# Patient Record
Sex: Female | Born: 1940 | Race: White | Hispanic: No | Smoking: Former smoker
Health system: Southern US, Community
[De-identification: ages and names within clinical notes are randomized; demographics above are authoritative.]

## PROBLEM LIST (undated history)

## (undated) DIAGNOSIS — I2510 Atherosclerotic heart disease of native coronary artery without angina pectoris: Secondary | ICD-10-CM

## (undated) DIAGNOSIS — I10 Essential (primary) hypertension: Secondary | ICD-10-CM

## (undated) DIAGNOSIS — I252 Old myocardial infarction: Secondary | ICD-10-CM

## (undated) DIAGNOSIS — E785 Hyperlipidemia, unspecified: Secondary | ICD-10-CM

## (undated) HISTORY — DX: Hyperlipidemia, unspecified: E78.5

## (undated) HISTORY — PX: NECK SURGERY: SHX720

## (undated) HISTORY — PX: BACK SURGERY: SHX140

## (undated) HISTORY — DX: Old myocardial infarction: I25.2

## (undated) HISTORY — PX: APPENDECTOMY: SHX54

## (undated) HISTORY — PX: TONSILLECTOMY: SUR1361

## (undated) HISTORY — DX: Essential (primary) hypertension: I10

## (undated) HISTORY — PX: OTHER SURGICAL HISTORY: SHX169

## (undated) HISTORY — DX: Atherosclerotic heart disease of native coronary artery without angina pectoris: I25.10

---

## 1996-03-22 DIAGNOSIS — I252 Old myocardial infarction: Secondary | ICD-10-CM

## 1996-03-22 HISTORY — DX: Old myocardial infarction: I25.2

## 2010-03-13 ENCOUNTER — Ambulatory Visit: Payer: Self-pay | Admitting: Cardiology

## 2010-03-13 ENCOUNTER — Encounter: Payer: Self-pay | Admitting: Cardiology

## 2010-03-13 DIAGNOSIS — E1151 Type 2 diabetes mellitus with diabetic peripheral angiopathy without gangrene: Secondary | ICD-10-CM

## 2010-03-13 DIAGNOSIS — I2510 Atherosclerotic heart disease of native coronary artery without angina pectoris: Secondary | ICD-10-CM

## 2010-03-13 DIAGNOSIS — E1165 Type 2 diabetes mellitus with hyperglycemia: Secondary | ICD-10-CM

## 2010-03-13 DIAGNOSIS — I10 Essential (primary) hypertension: Secondary | ICD-10-CM

## 2010-03-13 DIAGNOSIS — E785 Hyperlipidemia, unspecified: Secondary | ICD-10-CM | POA: Insufficient documentation

## 2010-03-25 ENCOUNTER — Telehealth (INDEPENDENT_AMBULATORY_CARE_PROVIDER_SITE_OTHER): Payer: Self-pay | Admitting: *Deleted

## 2010-03-26 ENCOUNTER — Encounter (HOSPITAL_COMMUNITY): Admission: RE | Admit: 2010-03-26 | Payer: Self-pay | Source: Home / Self Care | Admitting: Cardiology

## 2010-04-01 ENCOUNTER — Telehealth (INDEPENDENT_AMBULATORY_CARE_PROVIDER_SITE_OTHER): Payer: Self-pay | Admitting: *Deleted

## 2010-04-02 ENCOUNTER — Ambulatory Visit: Admission: RE | Admit: 2010-04-02 | Discharge: 2010-04-02 | Payer: Self-pay | Source: Home / Self Care

## 2010-04-02 ENCOUNTER — Encounter (HOSPITAL_COMMUNITY)
Admission: RE | Admit: 2010-04-02 | Discharge: 2010-04-21 | Payer: Self-pay | Source: Home / Self Care | Attending: Cardiology | Admitting: Cardiology

## 2010-04-02 ENCOUNTER — Encounter: Payer: Self-pay | Admitting: Cardiovascular Disease

## 2010-04-10 ENCOUNTER — Ambulatory Visit
Admission: RE | Admit: 2010-04-10 | Discharge: 2010-04-10 | Payer: Self-pay | Source: Home / Self Care | Attending: Cardiology | Admitting: Cardiology

## 2010-04-23 NOTE — Progress Notes (Signed)
Summary: Nuclear Pre-Procedure  Phone Note Outgoing Call   Call placed by: Milana Na, EMT-P,  April 01, 2010 3:43 PM Summary of Call: Reviewed information on Myoview Information Sheet (see scanned document for further details).  Spoke with patient.     Nuclear Med Background Indications for Stress Test: Evaluation for Ischemia, Surgical Clearance, Stent Patency  Indications Comments: Pending back surgery  History: Heart Catheterization, Myocardial Infarction, Stents  History Comments: '98 Heart Cath--MI--Stents [facility]     Nuclear Pre-Procedure Cardiac Risk Factors: CVA, History of Smoking, Hypertension, NIDDM Height (in): 63  Nuclear Med Study Referring MD:  T.Stuckey

## 2010-04-23 NOTE — Assessment & Plan Note (Addendum)
Summary: np6/surgical clearence for back surgery   Visit Type:  Initial Consult Primary Rosalina Dingwall:  Earlene Plater  CC:  Surgical clearance- back surgery.  History of Present Illness: Patient has had some severe pain in R leg, has had four separate injections, and has not had much relief.  Despite that, she has had virtually no improvement.  Patient has had prior stroke in 1996.  Her primary MD is Dr. Lois Huxley at [facility].  She is able to drive and work in her garden.  She gets no tightness in her chest.  She does not get shortness of breath.  Overall does pretty well.  She lives with her boyfriend.  SHe has no problems at present.  She stopped working after her stroke in 1996.  She was a smoker at that time.  Her smoking stopped after her heart attack.    Problems Prior to Update: 1)  Coronary Atherosclerosis Native Coronary Artery  (ICD-414.01)  Current Medications (verified): 1)  Simvastatin 40 Mg Tabs (Simvastatin) .... Take One Tablet By Mouth Daily At Bedtime 2)  Amlodipine Besylate 10 Mg Tabs (Amlodipine Besylate) .... Take One Tablet By Mouth Daily 3)  Metoprolol Succinate 100 Mg Xr24h-Tab (Metoprolol Succinate) .... Take 1 1/2 Tablets Daily 4)  Enalapril Maleate 10 Mg Tabs (Enalapril Maleate) .... Take One Tablet By Mouth Twice A Day 5)  Glipizide 5 Mg Xr24h-Tab (Glipizide) .... Take 2 Tablets Am and 1 Tablet Pm 6)  Metformin Hcl 1000 Mg Tabs (Metformin Hcl) .... Take 1 Tablet By Mouth Two Times A Day 7)  Amitriptyline Hcl 50 Mg Tabs (Amitriptyline Hcl) .... Take 2 Tablets At Bedtime 8)  Hydrocodone-Acetaminophen 2.5-500 Mg Tabs (Hydrocodone-Acetaminophen) .... As Needed 9)  Aspirin 81 Mg Tbec (Aspirin) .... Take One Tablet By Mouth Daily 10)  Fish Oil 1000 Mg Caps (Omega-3 Fatty Acids) .... Take 2 Capsules Two Times A Day 11)  Vitamin D 400 Unit Caps (Cholecalciferol) .... Take 2 A Day  Allergies (verified): 1)  ! Morphine 2)  ! Codeine 3)  ! * Ivp Dye  Past  History:  Past Medical History: CVA 1996  --- right side was weak.  Patient recovered from that and could not walk.  Went through therapy Patient had heart stent for a blockage--done in [place].  She had trouble during heart catheterization.  This was 1998.   hypertension diabetes  Past Surgical History: Stent placed.   DIsk in neck Back Surgery  --[facility] hospitals Skin cancer---treated in [facility] by Engineer, petroleum.  Family History: F  77  heart failure, emphysema M 85  cancer 1 sister  back problems  Social History: Former smoker Worked in Soil [employer] 3 children  --daughter in [place],   [place] Signs:  Patient profile:   70 year old female Height:      63 inches Weight:      164 pounds BMI:     29.16 Pulse rate:   101 / minute Pulse rhythm:   regular Resp:     18 per minute BP sitting:   154 / 70  (left arm) Cuff size:   large  Vitals Entered By: Vikki Ports (March 13, 2010 2:56 PM)  Physical Exam  General:  Well developed, well nourished, in no acute distress. Head:  normocephalic and atraumatic Eyes:  PERRLA/EOM intact; conjunctiva and lids normal. Neck:  Palpable carotids bilaterally.  No definite bruit.   Lungs:  Clear bilaterally to auscultation and percussion. Heart:  PMI non displaced. Normal  S1 and S2.  No murmur or rub, or gallop at present.   Abdomen:  Bowel sounds positive; abdomen soft and non-tender without masses, organomegaly, or hernias noted. No hepatosplenomegaly. Pulses:  pulses normal in all 4 extremities Extremities:  No clubbing or cyanosis. Neurologic:  Alert and oriented x 3.  Some r hand and leg weakness   Impression & Recommendations:  Problem # 1:  CORONARY ATHEROSCLEROSIS NATIVE CORONARY ARTERY (ICD-414.01) Has history of prior stent.  We will need to get records from [facility].  She has a stent, but is unknown about extent of her disease.  She cannot do 4 mets of activity, and therefore, will require functional  assessment by pharmacologic stress prior to surgery.  The procedure would be a moderate risk undertaking.   Her updated medication list for this problem includes:    Amlodipine Besylate 10 Mg Tabs (Amlodipine besylate) .Marland Kitchen... Take one tablet by mouth daily    Metoprolol Succinate 100 Mg Xr24h-tab (Metoprolol succinate) .Marland Kitchen... Take 1 1/2 tablets daily    Enalapril Maleate 10 Mg Tabs (Enalapril maleate) .Marland Kitchen... Take one tablet by mouth twice a day    Aspirin 81 Mg Tbec (Aspirin) .Marland Kitchen... Take one tablet by mouth daily  Orders: EKG w/ Interpretation (93000) Nuclear Stress Test (Nuc Stress Test)  Problem # 2:  HYPERTENSION, BENIGN (ICD-401.1) moderate control. Her updated medication list for this problem includes:    Amlodipine Besylate 10 Mg Tabs (Amlodipine besylate) .Marland Kitchen... Take one tablet by mouth daily    Metoprolol Succinate 100 Mg Xr24h-tab (Metoprolol succinate) .Marland Kitchen... Take 1 1/2 tablets daily    Enalapril Maleate 10 Mg Tabs (Enalapril maleate) .Marland Kitchen... Take one tablet by mouth twice a day    Aspirin 81 Mg Tbec (Aspirin) .Marland Kitchen... Take one tablet by mouth daily  Problem # 3:  DM (ICD-250.00) stable.  Hgb A1c under 7 per patient .  Her updated medication list for this problem includes:    Enalapril Maleate 10 Mg Tabs (Enalapril maleate) .Marland Kitchen... Take one tablet by mouth twice a day    Glipizide 5 Mg Xr24h-tab (Glipizide) .Marland Kitchen... Take 2 tablets am and 1 tablet pm    Metformin Hcl 1000 Mg Tabs (Metformin hcl) .Marland Kitchen... Take 1 tablet by mouth two times a day    Aspirin 81 Mg Tbec (Aspirin) .Marland Kitchen... Take one tablet by mouth daily  Problem # 4:  HYPERLIPIDEMIA-MIXED (ICD-272.4) has a good relationship with her primary MD.  Will defer management to him.  Her updated medication list for this problem includes:    Simvastatin 40 Mg Tabs (Simvastatin) .Marland Kitchen... Take one tablet by mouth daily at bedtime  Patient Instructions: 1)  Your physician has requested that you have a Lexiscan myoview.  For further information please  visit [email or web address]  Please follow instruction sheet, as given. 2)  Your physician recommends that you continue on your current medications as directed. Please refer to the Current Medication list given to you today. 3)  Your physician recommends that you schedule a follow-up appointment in: 1 MONTH

## 2010-04-23 NOTE — Assessment & Plan Note (Addendum)
Summary: Cardiology Nuclear Testing  Nuclear Med Background Indications for Stress Test: Evaluation for Ischemia, Surgical Clearance, Stent Patency  Indications Comments: Pending back surgery  History: Heart Catheterization, Myocardial Infarction, Stents  History Comments: '98 Heart Cath--MI--Stents [facility]     Nuclear Pre-Procedure Cardiac Risk Factors: CVA, History of Smoking, Hypertension, NIDDM Caffeine/Decaff Intake: None NPO After: 8:00 PM Lungs: clear IV 0.9% NS with Angio Cath: 20g     IV Site: R Forearm IV Started by: Stanton Kidney, EMT-P Chest Size (in) 44     Cup Size C     Height (in): 63 Weight (lb): 163 BMI: 28.98 Tech Comments: Metoprolol held > 24 hours, per Patient.   Nuclear Med Study 1 or 2 day study:  1 day     Stress Test Type:  Eugenie Birks Reading MD:  Charlton Haws, MD     Referring MD:  T.Stuckey Resting Radionuclide:  Technetium 64m Tetrofosmin     Resting Radionuclide Dose:  11.0 mCi  Stress Radionuclide:  Technetium 42m Tetrofosmin     Stress Radionuclide Dose:  33.0 mCi   Stress Protocol  Max Systolic BP: 158 mm Hg Lexiscan: 0.4 mg   Stress Test Technologist:  Milana Na, EMT-P     Nuclear Technologist:  Domenic Polite, CNMT  Rest Procedure  Myocardial perfusion imaging was performed at rest 45 minutes following the intravenous administration of Technetium 22m Tetrofosmin.  Stress Procedure  The patient received IV Lexiscan 0.4 mg over 15-seconds.  Technetium 6m Tetrofosmin injected at 30-seconds.  There were no significant changes with infusion.  Quantitative spect images were obtained after a 45 minute delay.  QPS Raw Data Images:  Normal; no motion artifact; normal heart/lung ratio. Stress Images:  Normal homogeneous uptake in all areas of the myocardium. Rest Images:  Normal homogeneous uptake in all areas of the myocardium. Subtraction (SDS):  Normal Transient Ischemic Dilatation:  1.07  (Normal <1.22)  Lung/Heart Ratio:  0.38  (Normal  <0.45)  Quantitative Gated Spect Images QGS EDV:  57 ml QGS ESV:  17 ml QGS EF:  70 % QGS cine images:  normal  Findings Normal nuclear study      Overall Impression  Exercise Capacity: Lexiscan with no exercise. BP Response: Normal blood pressure response. Clinical Symptoms: Cough ECG Impression: No significant ST segment change suggestive of ischemia. Overall Impression: Normal stress nuclear study.  Appended Document: Cardiology Nuclear Testing Reviewed with patient at time of office visit tS

## 2010-04-23 NOTE — Assessment & Plan Note (Signed)
Summary: APPT. AT 2:00P/OK PER LAUREN/D.MILLER   Visit Type:  Follow-up Primary Provider:  Earlene Plater   History of Present Illness: In today for followup.  Overall is stable.  Needs to have surgery. Underwent nuclear testing, and findings are as noted in note below.  No change since the last office visit.   Problems Prior to Update: 1)  Hyperlipidemia-mixed  (ICD-272.4) 2)  Dm  (ICD-250.00) 3)  Hypertension, Benign  (ICD-401.1) 4)  Coronary Atherosclerosis Native Coronary Artery  (ICD-414.01)  Current Medications (verified): 1)  Simvastatin 40 Mg Tabs (Simvastatin) .... Take One Tablet By Mouth Daily At Bedtime 2)  Amlodipine Besylate 10 Mg Tabs (Amlodipine Besylate) .... Take One Tablet By Mouth Daily 3)  Metoprolol Succinate 100 Mg Xr24h-Tab (Metoprolol Succinate) .... Take 1 1/2 Tablets Daily 4)  Enalapril Maleate 10 Mg Tabs (Enalapril Maleate) .... Take One Tablet By Mouth Twice A Day 5)  Glipizide 5 Mg Xr24h-Tab (Glipizide) .... Take 2 Tablets Am and 1 Tablet Pm 6)  Metformin Hcl 1000 Mg Tabs (Metformin Hcl) .... Take 1 Tablet By Mouth Two Times A Day 7)  Amitriptyline Hcl 50 Mg Tabs (Amitriptyline Hcl) .... Take 2 Tablets At Bedtime 8)  Hydrocodone-Acetaminophen 2.5-500 Mg Tabs (Hydrocodone-Acetaminophen) .... As Needed 9)  Aspirin 81 Mg Tbec (Aspirin) .... Take One Tablet By Mouth Daily 10)  Fish Oil 1000 Mg Caps (Omega-3 Fatty Acids) .... Take 2 Capsules Two Times A Day 11)  Vitamin D 400 Unit Caps (Cholecalciferol) .... Take 2 A Day  Allergies: 1)  ! Morphine 2)  ! Codeine 3)  ! * Ivp Dye  Past History:  Past Medical History: Last updated: 04/08/10 Heart attack 1998 Hypertension DM type 2 Back problems  Past Surgical History: Last updated: 08-Apr-2010 Back surgery  Family History: Last updated: 04/08/2010 Mother deceased 31 bone cancer father deceased 53 - heart failure sisters- 1 alive 31- back- hip problems  Social History: Last updated:  08-Apr-2010 widowed- 3 children Quit smoking 1998 Alcohol use no Drug use- no Retired desability  Vital Signs:  Patient profile:   70 year old female Height:      63 inches Weight:      161.50 pounds BMI:     28.71 Pulse rate:   97 / minute Pulse rhythm:   regular Resp:     18 per minute BP sitting:   148 / 75  (left arm) Cuff size:   large  Vitals Entered By: Vikki Ports (April 10, 2010 2:04 PM)  Physical Exam  General:  Well developed, well nourished, in no acute distress. Lungs:  Clear bilaterally to auscultation and percussion. Heart:  Non displaced.  Normal S1 and S2.   Extremities:  No clubbing or cyanosis. Neurologic:  Alert and oriented x 3.   Nuclear Study  Procedure date:  04/02/2010  Findings:      QPS  Raw Data Images:  Normal; no motion artifact; normal heart/lung ratio. Stress Images:  Normal homogeneous uptake in all areas of the myocardium. Rest Images:  Normal homogeneous uptake in all areas of the myocardium. Subtraction (SDS):  Normal Transient Ischemic Dilatation:  1.07  (Normal <1.22)  Lung/Heart Ratio:  0.38  (Normal <0.45)  Quantitative Gated Spect Images  QGS EDV:  57 ml QGS ESV:  17 ml QGS EF:  70 % QGS cine images:  normal  Findings  Normal nuclear study      Overall Impression   Exercise Capacity: Lexiscan with no exercise. BP Response: Normal blood pressure response.  Clinical Symptoms: Cough ECG Impression: No significant ST segment change suggestive of ischemia. Overall Impression: Normal stress nuclear study.    Signed by Colon Branch, MD, [facility] on 04/02/2010 at 4:51 PM   Impression & Recommendations:  Problem # 1:  CORONARY ATHEROSCLEROSIS NATIVE CORONARY ARTERY (ICD-414.01) See results of nuclear study.  No ischemia.  Value of study, and lack of value at times, discussed with patient and her family.  She should be stable to proceed on with surgery in absence of significant ischemia.   Her updated  medication list for this problem includes:    Amlodipine Besylate 10 Mg Tabs (Amlodipine besylate) .Marland Kitchen... Take one tablet by mouth daily    Metoprolol Succinate 100 Mg Xr24h-tab (Metoprolol succinate) .Marland Kitchen... Take 1 1/2 tablets daily    Enalapril Maleate 10 Mg Tabs (Enalapril maleate) .Marland Kitchen... Take one tablet by mouth twice a day    Aspirin 81 Mg Tbec (Aspirin) .Marland Kitchen... Take one tablet by mouth daily  Problem # 2:  HYPERTENSION, BENIGN (ICD-401.1) currently stable. Her updated medication list for this problem includes:    Amlodipine Besylate 10 Mg Tabs (Amlodipine besylate) .Marland Kitchen... Take one tablet by mouth daily    Metoprolol Succinate 100 Mg Xr24h-tab (Metoprolol succinate) .Marland Kitchen... Take 1 1/2 tablets daily    Enalapril Maleate 10 Mg Tabs (Enalapril maleate) .Marland Kitchen... Take one tablet by mouth twice a day    Aspirin 81 Mg Tbec (Aspirin) .Marland Kitchen... Take one tablet by mouth daily  Problem # 3:  HYPERLIPIDEMIA-MIXED (ICD-272.4) Not likely to get same control. but with amlodipine should switch medications.  Discussed in detail.  Will suggest switch of meds with uptitration over time.  FDA warning reviewed with patient.   The following medications were removed from the medication list:    Simvastatin 40 Mg Tabs (Simvastatin) .Marland Kitchen... Take one tablet by mouth daily at bedtime Her updated medication list for this problem includes:    Pravastatin Sodium 40 Mg Tabs (Pravastatin sodium) ..... One daily  Patient Instructions: 1)  Your physician recommends that you schedule a follow-up appointment in 3 months with Dr Riley Kill 2)  Your physician has recommended you make the following change in your medication: Stop simvastatin and start pravastatin 40 mg once daily Prescriptions: PRAVASTATIN SODIUM 40 MG TABS (PRAVASTATIN SODIUM) one daily  #30 x 11   Entered by:   Charolotte Capuchin, RN   Authorized by:   Ronaldo Miyamoto, MD, [facility]   Signed by:   Charolotte Capuchin, RN on 04/10/2010   Method used:   Electronically to         [facility] [phone]* (retail)       [address]       [place], [place]  [address]       Ph: [phone]       Fax: [phone]   RxID:   [id number]

## 2010-04-23 NOTE — Progress Notes (Signed)
Summary: Patient sick/ Lexiscan Myoview test  Phone Note Call from Patient   Caller: Patient Reason for Call: Acute Illness Action Taken: Phone Call Completed Summary of Call: Catherine Greene called in this am, she woke up sick. The patient states that her family has been sick. She has a Building surveyor tomorrow 03/26/10. She was advised to call back in the am on 03/26/10 so we can make the decision if she is able to do the test or reschedule. S.Williams EMTP Call placed by: Milana Na, EMT-P,  March 25, 2010 8:34 AM

## 2010-05-11 ENCOUNTER — Encounter (HOSPITAL_COMMUNITY)
Admission: RE | Admit: 2010-05-11 | Discharge: 2010-05-11 | Disposition: A | Discharge status: Home / Self Care | Payer: MEDICARE | Source: Ambulatory Visit | Attending: Neurosurgery | Admitting: Neurosurgery

## 2010-05-11 ENCOUNTER — Telehealth (INDEPENDENT_AMBULATORY_CARE_PROVIDER_SITE_OTHER): Payer: Self-pay | Admitting: *Deleted

## 2010-05-11 ENCOUNTER — Ambulatory Visit (HOSPITAL_COMMUNITY)
Admission: RE | Admit: 2010-05-11 | Discharge: 2010-05-11 | Disposition: A | Discharge status: Home / Self Care | Payer: MEDICARE | Source: Ambulatory Visit | Attending: Neurosurgery | Admitting: Neurosurgery

## 2010-05-11 ENCOUNTER — Other Ambulatory Visit (HOSPITAL_COMMUNITY): Payer: Self-pay | Admitting: Neurosurgery

## 2010-05-11 DIAGNOSIS — Z01818 Encounter for other preprocedural examination: Secondary | ICD-10-CM | POA: Insufficient documentation

## 2010-05-11 DIAGNOSIS — E119 Type 2 diabetes mellitus without complications: Secondary | ICD-10-CM | POA: Insufficient documentation

## 2010-05-11 DIAGNOSIS — I10 Essential (primary) hypertension: Secondary | ICD-10-CM | POA: Insufficient documentation

## 2010-05-11 DIAGNOSIS — M549 Dorsalgia, unspecified: Secondary | ICD-10-CM

## 2010-05-11 LAB — CBC
Hemoglobin: 14.3 g/dL (ref 12.0–15.0)
MCH: 29.4 pg (ref 26.0–34.0)
MCHC: 33.1 g/dL (ref 30.0–36.0)
MCV: 88.9 fL (ref 78.0–100.0)

## 2010-05-11 LAB — BASIC METABOLIC PANEL
BUN: 10 mg/dL (ref 6–23)
CO2: 27 mEq/L (ref 19–32)
Calcium: 9.7 mg/dL (ref 8.4–10.5)
Creatinine, Ser: 0.68 mg/dL (ref 0.4–1.2)
GFR calc Af Amer: >60 mL/min (ref >60)

## 2010-05-11 LAB — SURGICAL PCR SCREEN
MRSA, PCR: POSITIVE — AB
Staphylococcus aureus: POSITIVE — AB

## 2010-05-11 LAB — ABO/RH: ABO/RH(D): AB POS

## 2010-05-12 LAB — TYPE AND SCREEN: Antibody Screen: NEGATIVE

## 2010-05-15 ENCOUNTER — Inpatient Hospital Stay (HOSPITAL_COMMUNITY): Payer: MEDICARE

## 2010-05-15 ENCOUNTER — Encounter (HOSPITAL_COMMUNITY)
Admission: RE | Admit: 2010-05-15 | Discharge: 2010-05-15 | Disposition: A | Discharge status: Home / Self Care | Payer: MEDICARE | Source: Ambulatory Visit | Attending: Neurosurgery | Admitting: Neurosurgery

## 2010-05-15 ENCOUNTER — Other Ambulatory Visit (HOSPITAL_COMMUNITY): Payer: Self-pay | Admitting: Neurosurgery

## 2010-05-15 ENCOUNTER — Inpatient Hospital Stay (HOSPITAL_COMMUNITY)
Admission: RE | Admit: 2010-05-15 | Discharge: 2010-05-18 | DRG: 460 | Disposition: A | Discharge status: Home / Self Care | Payer: MEDICARE | Source: Ambulatory Visit | Attending: Neurosurgery | Admitting: Neurosurgery

## 2010-05-15 DIAGNOSIS — Q762 Congenital spondylolisthesis: Secondary | ICD-10-CM

## 2010-05-15 DIAGNOSIS — E119 Type 2 diabetes mellitus without complications: Secondary | ICD-10-CM | POA: Diagnosis present

## 2010-05-15 DIAGNOSIS — M4120 Other idiopathic scoliosis, site unspecified: Secondary | ICD-10-CM | POA: Diagnosis present

## 2010-05-15 DIAGNOSIS — Z01811 Encounter for preprocedural respiratory examination: Secondary | ICD-10-CM

## 2010-05-15 DIAGNOSIS — Z9861 Coronary angioplasty status: Secondary | ICD-10-CM

## 2010-05-15 DIAGNOSIS — I10 Essential (primary) hypertension: Secondary | ICD-10-CM | POA: Diagnosis present

## 2010-05-15 DIAGNOSIS — I2510 Atherosclerotic heart disease of native coronary artery without angina pectoris: Secondary | ICD-10-CM | POA: Diagnosis present

## 2010-05-15 DIAGNOSIS — M5126 Other intervertebral disc displacement, lumbar region: Principal | ICD-10-CM | POA: Diagnosis present

## 2010-05-15 LAB — GLUCOSE, CAPILLARY
Glucose-Capillary: 174 mg/dL — ABNORMAL HIGH (ref 70–99)
Glucose-Capillary: 208 mg/dL — ABNORMAL HIGH (ref 70–99)

## 2010-05-16 LAB — GLUCOSE, CAPILLARY: Glucose-Capillary: 95 mg/dL (ref 70–99)

## 2010-05-17 ENCOUNTER — Inpatient Hospital Stay (HOSPITAL_COMMUNITY): Payer: MEDICARE

## 2010-05-17 LAB — GLUCOSE, CAPILLARY
Glucose-Capillary: 137 mg/dL — ABNORMAL HIGH (ref 70–99)
Glucose-Capillary: 192 mg/dL — ABNORMAL HIGH (ref 70–99)

## 2010-05-18 LAB — GLUCOSE, CAPILLARY: Glucose-Capillary: 191 mg/dL — ABNORMAL HIGH (ref 70–99)

## 2010-05-19 NOTE — Progress Notes (Signed)
Summary: Records Request  Faxed EKG & Stress to [name] (Per Delice Bison) at [facility] ([phone]). [name]  May 11, 2010 3:43 PM

## 2010-05-30 NOTE — Op Note (Signed)
NAME:  Catherine Greene, Catherine Greene NO.:  [id number]  MEDICAL RECORD NO.:  [id number]           PATIENT TYPE:  O  LOCATION:  XRAY                         FACILITY:  [facility]  PHYSICIAN:  Reinaldo Meeker, M.D. 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  PROCEDURE:  Right L3-4, L4-5 maximum access TLIF with PEEK interbody spacer fusion, with segmental pedicle screw instrumentation, L3-4 and L4- 5 with an invasive DBR system.  SURGEON:  Reinaldo Meeker, MD.  ASSISTANT:  Donalee Citrin, MD.  PROCEDURE IN DETAIL:  After being placed in the prone position, the patient's back was prepped and draped in usual sterile fashion.  AP fluoroscopy was used to identify entry points for cannulation of the pedicle at L4 and L5.  Jamshidi needle was passed in the pedicles slightly lateral to medial direction.  A guidewire was placed through the Jamshidi needle.  The Jamshidi needle was removed without difficulty.  Two small incisions were then connected through the fascia. Sequential dilation was then carried out and then we tapped with a 5-mm tap and placed 65 x 45 mm screws at each level with the retractor blade attached.  We then attached the retractor to the system, opened up in standard fashion.  We then attached it to the arm of the table and attached the medial blade.  We then spent approximately 10 minutes removing soft tissue to identify the lamina, facet joint at L4-5.  High- speed drill was used to remove the inferior 75% of the L4 lamina, the medial three-quarters of the facet joint, and the superior one-third of the L5 lamina.  Residual bone was removed and saved for use later in the case.  Ligamentum  flavum was removed in a piecemeal fashion.  L4 and L5 nerve roots both could be well identified, more so the need for transverse lumbar interbody fusion.  These were well decompressed.  Disk space was then incised, thoroughly cleaned out with pituitary rongeurs and curettes.  At this time, the disk space was prepared for interbody fusion.  It was distracted up to a 10 mm size, which was felt to be an excellent choice.  We then prepared it once more and packed a mixture of EquivaBone, OsteoSet Plus, and autologous bone deep within the interspace to help with the interbody fusion.  We then impacted a curvilinear cage without difficulty and followed this into good position.  We kicked it nicely to the midline.  We then attached the polyaxial head to the screw head of L5 after removing it from the hoop shim.  We then removed the retractor from the blade at L4, rotated the blade around the opposite side.  We then cannulated the pedicle of L3 in standard fashion.  We passed the Jamshidi needle through the pedicle without difficulty and then placed a guidewire through it from theJamshidi  needle.  We connected this incision to the other incisions.  We then did sequential distraction, tapped it with a five 5-mm tap, and then placed a 65 x 45-mm screw at this level as well.  This was followed with fluoroscopy and intraoperative EMG monitoring.  We then attached the retractor to the screw heads and secured it to the arm of the table. Once again, placed the medial blade, opened it in standard fashion, attached the light source, and then I spent time removing the soft tissue to identify the anatomy with the lamina of L3, the residual lamina of L4, and the facet joint of L3-4.  Once again, high-speed drill was used to perform a generous laminotomy by removing the inferior 80% of the L3 lamina, the medial three-quarters of facet joint, and the superior edge of the L4 lamina.  Once again, residual bone was  removed and saved for use later in the case.  Ligamentum flavum was removed in a piecemeal fashion.  L3 and L4 nerve roots were visualized, well decompressed, worsened, and the need for transverse lumbar interbody fusion.  Disk space was then incised and thoroughly cleaned out.  We identified an inferior fragment of disk, removed this from underneath the L4 nerve root, this gave excellent decompression.  At this time, inspection was carried out in all directions for any evidence of residual compression, none could be identified.  Disk space was then distracted sequentially up to a 10 mm size, which was felt to be a good fit.  We then prepared the disk space for interbody fusion.  Prior to placing the interbody graft, we placed a mixture of EquivaBone, OsteoSet Plus, and autologous bone deep within the interspace to help with the interbody fusion just as we had done at L4-5.  We then passed a straight cage due to the nearness in the canal at this level, then kicked in a transverse position which we did successfully.  We then irrigated copiously.  We controlled any bleeding at both levels with bipolar coagulation and Gelfoam.  We placed polyaxial heads on the top of the screw heads once we removed them from the retractor.  We then advanced the screws into their final positions.  We measured from top to bottom and used a 47-1/2 double  rod.  This fit well into the screw heads and we attached it in standard fashion.  We then did final tightening with torque and countertorque.  Final fluoroscopy in lateral direction showed good placement of the screws and rod interbody devices.  Large amounts of irrigation carried out.  An epidural drain was left in the epidural space and brought out through a separate stab wound incision.  The wound was then closed in multiple layers of Vicryl in the muscle fascia, subcutaneous and subcuticular tissues, and Dermabond was placed on the skin.  Sterile dressing  was then applied.  The patient was extubated, taken to recovery room in stable condition.          ______________________________ Reinaldo Meeker, M.D.     ROK/MEDQ  D:  05/15/2010  T:  05/16/2010  Job:  [id number]  Electronically Signed by Aliene Beams M.D. on 05/30/2010 10:24:02 AM

## 2010-06-11 NOTE — Discharge Summary (Signed)
  NAME:  Catherine Greene, Catherine Greene NO.:  [id number]  MEDICAL RECORD NO.:  [id number]           PATIENT TYPE:  I  LOCATION:  3021                         FACILITY:  [facility]  PHYSICIAN:  Reinaldo Meeker, M.D. DATE OF BIRTH:  1941/02/26  DATE OF ADMISSION:  05/15/2010 DATE OF DISCHARGE:  05/18/2010                              DISCHARGE SUMMARY   OPERATIVE PROCEDURE:  Right L3-4 and L4-5 minimally invasive TLIF.  HISTORY:  Mr. Shafer is a 70 year old female with back and right lower extremity pain.  MRI scan shows a herniated disk at L3-4 with stenosis and scoliosis and listhesis at L4-5.  She now comes in for two-level minimally invasive TLIF.  On May 15, 2010, she was taken to the operating room.  She underwent the above-mentioned procedure.  She tolerated it well and woke up with improvement in her leg pain.  Over the next few days, she was slowly able to increase her activity, advance her diet without difficulty.  She did develop some severe postoperative inflammatory discomfort.  She underwent a CT scan of her back which looked good.  She was put on Decadron and got some improvement.  When she began to feel better, she requested a discharge home.  She was discharge home on a Medrol Dosepak.  Her wound looked good at that time. Her condition was stable versus admission though it did appear to be improving.          ______________________________ Reinaldo Meeker, M.D.     ROK/MEDQ  D:  05/30/2010  T:  05/30/2010  Job:  [id number]  Electronically Signed by Aliene Beams M.D. on 06/11/2010 06:16:36 PM

## 2010-06-23 ENCOUNTER — Ambulatory Visit
Admission: RE | Admit: 2010-06-23 | Discharge: 2010-06-23 | Disposition: A | Discharge status: Home / Self Care | Payer: MEDICARE | Source: Ambulatory Visit | Attending: Neurosurgery | Admitting: Neurosurgery

## 2010-06-23 ENCOUNTER — Other Ambulatory Visit: Payer: Self-pay | Admitting: Neurosurgery

## 2010-06-23 DIAGNOSIS — IMO0002 Reserved for concepts with insufficient information to code with codable children: Secondary | ICD-10-CM

## 2010-06-23 DIAGNOSIS — M48061 Spinal stenosis, lumbar region without neurogenic claudication: Secondary | ICD-10-CM

## 2010-06-23 DIAGNOSIS — M533 Sacrococcygeal disorders, not elsewhere classified: Secondary | ICD-10-CM

## 2010-08-03 ENCOUNTER — Other Ambulatory Visit: Payer: Self-pay | Admitting: Neurosurgery

## 2010-08-03 ENCOUNTER — Ambulatory Visit
Admission: RE | Admit: 2010-08-03 | Discharge: 2010-08-03 | Disposition: A | Discharge status: Home / Self Care | Payer: MEDICARE | Source: Ambulatory Visit | Attending: Neurosurgery | Admitting: Neurosurgery

## 2010-08-03 DIAGNOSIS — IMO0002 Reserved for concepts with insufficient information to code with codable children: Secondary | ICD-10-CM

## 2010-08-03 DIAGNOSIS — M533 Sacrococcygeal disorders, not elsewhere classified: Secondary | ICD-10-CM

## 2010-08-03 DIAGNOSIS — M4120 Other idiopathic scoliosis, site unspecified: Secondary | ICD-10-CM

## 2010-08-03 DIAGNOSIS — M4310 Spondylolisthesis, site unspecified: Secondary | ICD-10-CM

## 2010-08-03 DIAGNOSIS — M48061 Spinal stenosis, lumbar region without neurogenic claudication: Secondary | ICD-10-CM

## 2010-09-28 ENCOUNTER — Encounter: Payer: Self-pay | Admitting: Cardiology

## 2010-09-29 ENCOUNTER — Encounter: Payer: Self-pay | Admitting: Cardiology

## 2010-09-29 ENCOUNTER — Ambulatory Visit (INDEPENDENT_AMBULATORY_CARE_PROVIDER_SITE_OTHER): Payer: Medicare Other | Admitting: Cardiology

## 2010-09-29 VITALS — BP 158/74 | HR 93 | Resp 18 | Ht 62.0 in | Wt 159.4 lb

## 2010-09-29 DIAGNOSIS — I2510 Atherosclerotic heart disease of native coronary artery without angina pectoris: Secondary | ICD-10-CM

## 2010-09-29 DIAGNOSIS — E785 Hyperlipidemia, unspecified: Secondary | ICD-10-CM

## 2010-09-29 DIAGNOSIS — I10 Essential (primary) hypertension: Secondary | ICD-10-CM

## 2010-09-29 NOTE — Progress Notes (Signed)
HPI:  Patient is in for follow up.  She underwent the following operative procedure.  She had a negative preop nuclear study, and had an uncomplicated hospital course.  Continues to get along well.  Denies any chest pain at the present time.  No ongoing symptoms.  May 16, 2010   PREOPERATIVE DIAGNOSIS:  Herniated disk, L3-4 right with scoliosis and   listhesis at L3-4 and L4-5.      POSTOPERATIVE DIAGNOSIS:  Herniated disk, L3-4 right with scoliosis and   listhesis at L3-4 and L4-5.      PROCEDURE:  Right L3-4, L4-5 maximum access TLIF with PEEK interbody   spacer fusion, with segmental pedicle screw instrumentation, L3-4 and L4-   5 with an invasive DBR system.      SURGEON:  Reinaldo Meeker, MD.      ASSISTANT:  Donalee Citrin, MD.        Current Outpatient Prescriptions  Medication Sig Dispense Refill  . amitriptyline (ELAVIL) 50 MG tablet Take 100 mg by mouth at bedtime.        Marland Kitchen amLODipine (NORVASC) 10 MG tablet Take 10 mg by mouth daily.        Marland Kitchen aspirin 81 MG tablet Take 81 mg by mouth daily.        . enalapril (VASOTEC) 10 MG tablet Take 10 mg by mouth 2 (two) times daily.        Marland Kitchen glipiZIDE (GLUCOTROL) 5 MG tablet Take by mouth. Take 2 tablets Am and 1 tablet PM       . HYDROcodone-acetaminophen (NORCO) 10-325 MG per tablet Take 1 tablet by mouth every 6 (six) hours as needed.        . metFORMIN (GLUCOPHAGE) 1000 MG tablet Take 1,000 mg by mouth 2 (two) times daily with a meal.        . metoprolol (TOPROL-XL) 100 MG 24 hr tablet Take 100 mg by mouth. Take 1 1/2 tablets daily       . Omega-3 Fatty Acids (FISH OIL) 1000 MG CAPS Take 2 capsules by mouth 2 (two) times daily.        . pravastatin (PRAVACHOL) 40 MG tablet Take 40 mg by mouth daily.        . vitamin E 400 UNIT capsule Take 400 Units by mouth daily.          Allergies  Allergen Reactions  . Codeine     REACTION: sickness  . Morphine     REACTION: sickness    Past Medical History  Diagnosis Date  .  Hypertension   . Diabetes mellitus     type 2  . Past heart attack 1998    Past Surgical History  Procedure Date  . Back surgery     Family History  Problem Relation Age of Onset  . Bone cancer Mother 17    died  . Heart failure Father 78    died    History   Social History  . Marital Status: Divorced    Spouse Name: N/A    Number of Children: 3  . Years of Education: N/A   Occupational History  . retired   . desability    Social History Main Topics  . Smoking status: Former Smoker    Types: Cigarettes    Quit date: 03/22/1996  . Smokeless tobacco: Not on file  . Alcohol Use: No  . Drug Use: No  . Sexually Active: Not on file   Other Topics Concern  .  Not on file   Social History Narrative  . No narrative on file    ROS: Please see the HPI.  All other systems reviewed and negative.  PHYSICAL EXAM:  BP 158/74  Pulse 93  Resp 18  Ht 5\' 2"  (1.575 m)  Wt 159 lb 6.4 oz (72.303 kg)  BMI 29.15 kg/m2  General: Well developed, well nourished, in no acute distress. Head:  Normocephalic and atraumatic. Neck: no JVD Lungs: Clear to auscultation and percussion. Heart: Normal S1 and S2.  No murmur, rubs or gallops.  Abdomen:  Normal bowel sounds; soft; non tender; no organomegaly Pulses: Pulses normal in all 4 extremities. Extremities: No clubbing or cyanosis. No edema. Neurologic: Alert and oriented x 3.  EKG: NSR.  Left axis deviation.  No acute changes.    ASSESSMENT AND PLAN:

## 2010-10-21 NOTE — Assessment & Plan Note (Signed)
Switched her statin because of amlodipine.  Has follow up with her primary care MD.  Labs should be done by them.  Patient aware.

## 2010-10-21 NOTE — Assessment & Plan Note (Signed)
Remains stable at the present time.  Some limited activity overall, but no evidence of ischemia.  Has remote PCI.  Records not present.

## 2010-10-21 NOTE — Assessment & Plan Note (Signed)
Still a touch elevated.  Should check at home.

## 2010-10-21 NOTE — Patient Instructions (Signed)
Return to clinic in one year.   Check home BP.  Make sure lipids are checked with primary care MD.

## 2011-11-09 ENCOUNTER — Ambulatory Visit: Payer: Medicare Other | Admitting: Cardiology

## 2011-11-29 ENCOUNTER — Ambulatory Visit (INDEPENDENT_AMBULATORY_CARE_PROVIDER_SITE_OTHER): Payer: Medicare Other | Admitting: Cardiology

## 2011-11-29 ENCOUNTER — Encounter: Payer: Self-pay | Admitting: Cardiology

## 2011-11-29 VITALS — BP 140/78 | HR 78 | Ht 62.0 in | Wt 160.0 lb

## 2011-11-29 DIAGNOSIS — I2510 Atherosclerotic heart disease of native coronary artery without angina pectoris: Secondary | ICD-10-CM

## 2011-11-29 DIAGNOSIS — I10 Essential (primary) hypertension: Secondary | ICD-10-CM

## 2011-11-29 DIAGNOSIS — E119 Type 2 diabetes mellitus without complications: Secondary | ICD-10-CM

## 2011-11-29 DIAGNOSIS — E785 Hyperlipidemia, unspecified: Secondary | ICD-10-CM

## 2011-11-29 NOTE — Assessment & Plan Note (Signed)
Has normal LV function overall by radionuclear study and no ischemia.  Remote history of PCI.  Continue on medical therapy for now.

## 2011-11-29 NOTE — Assessment & Plan Note (Signed)
Followed by her primary care MD.  She has frequent labs per history and has been doing well.

## 2011-11-29 NOTE — Assessment & Plan Note (Signed)
Controlled on medical therapy. 

## 2011-11-29 NOTE — Patient Instructions (Addendum)
Your physician wants you to follow-up in: ONE YEAR WITH DR. Clifton James You will receive a reminder letter in the mail two months in advance. If you don't receive a letter, please call our office to schedule the follow-up appointment.   Your physician recommends that you continue on your current medications as directed. Please refer to the Current Medication list given to you today.

## 2011-11-29 NOTE — Assessment & Plan Note (Signed)
Most recent HbA1c per patient is 6.3%.  Followed by her primary, Dr.Davis.

## 2011-11-29 NOTE — Progress Notes (Signed)
HPI: The patient returns today in followup.  She is doing well from a cardiac standpoint. She's had no recurrent chest pain. The patient underwent elective back surgery about a year ago and unfortunately she has not improved very much from this. She has had her lipid profile checked by her primary care physician and told that is done well. The patient a stroke approximately 15 years ago.  Her  most recent hemoglobin A1c was 6.3.  No current chest pain.    Current Outpatient Prescriptions  Medication Sig Dispense Refill  . amitriptyline (ELAVIL) 50 MG tablet Take 100 mg by mouth at bedtime.        Marland Kitchen amLODipine (NORVASC) 10 MG tablet Take 10 mg by mouth daily.        Marland Kitchen aspirin 81 MG tablet Take 81 mg by mouth daily.        Marland Kitchen glipiZIDE (GLUCOTROL) 5 MG tablet Take by mouth. Take 2 tablets Am and 1 tablet PM       . HYDROcodone-acetaminophen (NORCO) 10-325 MG per tablet Take 1 tablet by mouth every 6 (six) hours as needed.        Marland Kitchen losartan (COZAAR) 100 MG tablet Daily.      . metFORMIN (GLUCOPHAGE) 1000 MG tablet Take 1,000 mg by mouth 2 (two) times daily with a meal.        . metoprolol (TOPROL-XL) 100 MG 24 hr tablet Take 100 mg by mouth. Take 1 1/2 tablets daily       . Omega-3 Fatty Acids (FISH OIL) 1000 MG CAPS Take 2 capsules by mouth 2 (two) times daily.        Marland Kitchen oxyCODONE-acetaminophen (PERCOCET) 7.5-325 MG per tablet as needed.      . pravastatin (PRAVACHOL) 40 MG tablet Take 40 mg by mouth daily.        . vitamin E 400 UNIT capsule Take 400 Units by mouth daily.          Allergies  Allergen Reactions  . Codeine     REACTION: sickness  . Morphine     REACTION: sickness    Past Medical History  Diagnosis Date  . Hypertension   . Diabetes mellitus     type 2  . Past heart attack 1998    Past Surgical History  Procedure Date  . Back surgery     Family History  Problem Relation Age of Onset  . Bone cancer Mother 38    died  . Heart failure Father 63    died     History   Social History  . Marital Status: Divorced    Spouse Name: N/A    Number of Children: 3  . Years of Education: N/A   Occupational History  . retired   . desability    Social History Main Topics  . Smoking status: Former Smoker    Types: Cigarettes    Quit date: 03/22/1996  . Smokeless tobacco: Not on file  . Alcohol Use: No  . Drug Use: No  . Sexually Active: Not on file   Other Topics Concern  . Not on file   Social History Narrative  . No narrative on file    ROS: Please see the HPI.  All other systems reviewed and negative.  PHYSICAL EXAM:  BP 140/78  Pulse 78  Ht 5\' 2"  (1.575 m)  Wt 160 lb (72.576 kg)  BMI 29.26 kg/m2  General: Well developed, well nourished, in no acute distress.  Cane in hand.  Head:  Normocephalic and atraumatic. Neck: no JVD Lungs: Clear to auscultation and percussion. Heart: Normal S1 and S2.  No murmur, rubs or gallops.  Pulses: Pulses normal in all 4 extremities. Extremities: No clubbing or cyanosis. No edema. Neurologic: Alert and oriented x 3.  EKG:  NSR.  Left axis deviation.  Delay in R wave progression due to axis shift.    ASSESSMENT AND PLAN:

## 2011-11-30 ENCOUNTER — Ambulatory Visit: Payer: Medicare Other | Admitting: Cardiology

## 2012-07-04 DIAGNOSIS — E119 Type 2 diabetes mellitus without complications: Secondary | ICD-10-CM | POA: Insufficient documentation

## 2012-07-04 DIAGNOSIS — E785 Hyperlipidemia, unspecified: Secondary | ICD-10-CM | POA: Insufficient documentation

## 2012-07-04 DIAGNOSIS — M4120 Other idiopathic scoliosis, site unspecified: Secondary | ICD-10-CM | POA: Insufficient documentation

## 2012-07-04 DIAGNOSIS — M5126 Other intervertebral disc displacement, lumbar region: Secondary | ICD-10-CM | POA: Insufficient documentation

## 2012-07-04 DIAGNOSIS — Q762 Congenital spondylolisthesis: Secondary | ICD-10-CM | POA: Insufficient documentation

## 2012-07-04 DIAGNOSIS — I739 Peripheral vascular disease, unspecified: Secondary | ICD-10-CM | POA: Insufficient documentation

## 2012-07-04 DIAGNOSIS — I6522 Occlusion and stenosis of left carotid artery: Secondary | ICD-10-CM | POA: Insufficient documentation

## 2013-01-16 ENCOUNTER — Ambulatory Visit (INDEPENDENT_AMBULATORY_CARE_PROVIDER_SITE_OTHER): Payer: Medicare Other | Admitting: Cardiovascular Disease

## 2013-01-16 ENCOUNTER — Encounter: Payer: Self-pay | Admitting: Cardiovascular Disease

## 2013-01-16 VITALS — BP 122/62 | HR 70 | Ht 62.0 in | Wt 160.0 lb

## 2013-01-16 DIAGNOSIS — I2510 Atherosclerotic heart disease of native coronary artery without angina pectoris: Secondary | ICD-10-CM

## 2013-01-16 DIAGNOSIS — E785 Hyperlipidemia, unspecified: Secondary | ICD-10-CM

## 2013-01-16 DIAGNOSIS — I10 Essential (primary) hypertension: Secondary | ICD-10-CM

## 2013-01-16 NOTE — Progress Notes (Signed)
History of Present Illness: 72 yo female with history of CAD, HTN, DM, HLD, prior CVA 1996 who is here today for cardiac follow up. She has been followed by Dr. Riley Kill. She has a history of remote PCI in 1998. Stress myoview February 2012 with no ischemia. She is known to have carotid artery disease and has this followed in primary care.   She is here today for follow up. She has had no chest pain or SOB. No lower ext edema, PND, orthopnea, near syncope or syncope. She does have fatigue but no changes.   Primary Care Physician: Lois Huxley  Last Lipid Profile: Followed in primary care  Past Medical History  Diagnosis Date  . Hypertension   . Diabetes mellitus     type 2  . Past heart attack 1998    Past Surgical History  Procedure Laterality Date  . Back surgery      x 2  . Neck surgery    . Skin cancer removal face    . Tonsillectomy    . Appendectomy    . Cesarean section      x 3    Current Outpatient Prescriptions  Medication Sig Dispense Refill  . amitriptyline (ELAVIL) 50 MG tablet Take 100 mg by mouth at bedtime.        Marland Kitchen amLODipine (NORVASC) 10 MG tablet Take 10 mg by mouth daily.        Marland Kitchen aspirin 81 MG tablet Take 81 mg by mouth daily.        Marland Kitchen glipiZIDE (GLUCOTROL) 5 MG tablet Take by mouth. Take 2 tablets Am and 1 tablet PM       . losartan (COZAAR) 100 MG tablet Daily.      . metFORMIN (GLUCOPHAGE) 1000 MG tablet Take 1,000 mg by mouth 2 (two) times daily with a meal.        . metoprolol (LOPRESSOR) 100 MG tablet Take 1.5 tablets by mouth 2 (two) times daily.      . Omega-3 Fatty Acids (FISH OIL) 1000 MG CAPS Take 2 capsules by mouth 2 (two) times daily.        Marland Kitchen oxyCODONE-acetaminophen (PERCOCET) 7.5-325 MG per tablet as needed.      . pravastatin (PRAVACHOL) 40 MG tablet Take 40 mg by mouth daily.         No current facility-administered medications for this visit.    Allergies  Allergen Reactions  . Codeine     REACTION: sickness  . Contrast  Media [Iodinated Diagnostic Agents]   . Morphine     REACTION: sickness    History   Social History  . Marital Status: Divorced    Spouse Name: N/A    Number of Children: 3  . Years of Education: N/A   Occupational History  . retired    Social History Main Topics  . Smoking status: Former Smoker -- 2.00 packs/day for 40 years    Types: Cigarettes    Quit date: 03/22/1996  . Smokeless tobacco: Not on file  . Alcohol Use: No  . Drug Use: No  . Sexual Activity: Not on file   Other Topics Concern  . Not on file   Social History Narrative  . No narrative on file    Family History  Problem Relation Age of Onset  . Bone cancer Mother 63    died  . Heart failure Father 29    died    Review of Systems:  As  stated in the HPI and otherwise negative.   BP 122/62  Pulse 70  Ht 5\' 2"  (1.575 m)  Wt 160 lb (72.576 kg)  BMI 29.26 kg/m2  Physical Examination: General: Well developed, well nourished, NAD HEENT: OP clear, mucus membranes moist SKIN: warm, dry. No rashes. Neuro: No focal deficits Musculoskeletal: Muscle strength 5/5 all ext Psychiatric: Mood and affect normal Neck: No JVD, no carotid bruits, no thyromegaly, no lymphadenopathy. Lungs:Clear bilaterally, no wheezes, rhonci, crackles Cardiovascular: Regular rate and rhythm. No murmurs, gallops or rubs. Abdomen:Soft. Bowel sounds present. Non-tender.  Extremities: No lower extremity edema. Pulses are 2 + in the bilateral DP/PT.  EKG: NSR, rate 70 bpm. Poor R wave progression precordial leads.   Assessment and Plan:   1. CAD: Stable. Continue ASA, statin, beta blocker.   2. HTN: BP well controlled.   3. Hyperlipidemia: She is on a statin. Lipids followed in primary care.

## 2013-01-16 NOTE — Patient Instructions (Signed)
Your physician wants you to follow-up in:  6 months. You will receive a reminder letter in the mail two months in advance. If you don't receive a letter, please call our office to schedule the follow-up appointment.   

## 2013-04-06 ENCOUNTER — Encounter: Payer: Self-pay | Admitting: Cardiovascular Disease

## 2013-05-21 DIAGNOSIS — R634 Abnormal weight loss: Secondary | ICD-10-CM | POA: Insufficient documentation

## 2013-05-21 DIAGNOSIS — Z5320 Procedure and treatment not carried out because of patient's decision for unspecified reasons: Secondary | ICD-10-CM | POA: Insufficient documentation

## 2013-05-21 DIAGNOSIS — R197 Diarrhea, unspecified: Secondary | ICD-10-CM | POA: Insufficient documentation

## 2013-07-02 DIAGNOSIS — F419 Anxiety disorder, unspecified: Secondary | ICD-10-CM | POA: Insufficient documentation

## 2013-07-17 ENCOUNTER — Encounter: Payer: Self-pay | Admitting: Cardiovascular Disease

## 2013-07-17 ENCOUNTER — Ambulatory Visit (INDEPENDENT_AMBULATORY_CARE_PROVIDER_SITE_OTHER): Payer: Medicare Other | Admitting: Cardiovascular Disease

## 2013-07-17 VITALS — BP 162/72 | HR 72 | Ht 62.0 in | Wt 159.0 lb

## 2013-07-17 DIAGNOSIS — E785 Hyperlipidemia, unspecified: Secondary | ICD-10-CM

## 2013-07-17 DIAGNOSIS — R5381 Other malaise: Secondary | ICD-10-CM

## 2013-07-17 DIAGNOSIS — G4700 Insomnia, unspecified: Secondary | ICD-10-CM

## 2013-07-17 DIAGNOSIS — I10 Essential (primary) hypertension: Secondary | ICD-10-CM

## 2013-07-17 DIAGNOSIS — I2510 Atherosclerotic heart disease of native coronary artery without angina pectoris: Secondary | ICD-10-CM

## 2013-07-17 DIAGNOSIS — R5383 Other fatigue: Secondary | ICD-10-CM

## 2013-07-17 MED ORDER — HYDROCHLOROTHIAZIDE 25 MG PO TABS: 25.0000 mg | ORAL_TABLET | Freq: Every day | ORAL | Status: DC

## 2013-07-17 MED ORDER — ZOLPIDEM TARTRATE 5 MG PO TABS: 5.0000 mg | ORAL_TABLET | Freq: Every evening | ORAL | Status: DC | PRN

## 2013-07-17 NOTE — Patient Instructions (Signed)
Your physician wants you to follow-up in: 6 months.   You will receive a reminder letter in the mail two months in advance. If you don't receive a letter, please call our office to schedule the follow-up appointment.  Your physician has requested that you have an echocardiogram. Echocardiography is a painless test that uses sound waves to create images of your heart. It provides your doctor with information about the size and shape of your heart and how well your heart's chambers and valves are working. This procedure takes approximately one hour. There are no restrictions for this procedure.   Your physician has recommended you make the following change in your medication:  Start hydrochlorothiazide 25 mg by mouth daily

## 2013-07-17 NOTE — Progress Notes (Signed)
History of Present Illness: 73 yo female with history of CAD, HTN, DM, HLD, prior CVA 1996 who is here today for cardiac follow up. She has been followed by Dr. Lia Foyer. She has a history of remote PCI in 1998. Stress myoview February 2012 with no ischemia. She is known to have carotid artery disease and has this followed in primary care.   She is here today for follow up. She has had no chest pain or SOB. No lower ext edema, PND, orthopnea, near syncope or syncope. She does have fatigue. She is not sleeping at night.   Primary Care Physician: Alean Rinne  Last Lipid Profile: Followed in primary care, Reviewed in [place] today and lipids well controlled.   Past Medical History  Diagnosis Date  . Hypertension   . Diabetes mellitus     type 2  . Past heart attack 1998  . CAD (coronary artery disease)   . Hyperlipidemia     Past Surgical History  Procedure Laterality Date  . Back surgery      x 2  . Neck surgery    . Skin cancer removal face    . Tonsillectomy    . Appendectomy    . Cesarean section      x 3    Current Outpatient Prescriptions  Medication Sig Dispense Refill  . amLODipine (NORVASC) 10 MG tablet Take 10 mg by mouth daily.        Marland Kitchen aspirin 81 MG tablet Take 81 mg by mouth daily.        Marland Kitchen glipiZIDE (GLUCOTROL) 5 MG tablet Take by mouth. Take 1 tablets Am and 1 tablet PM      . LORazepam (ATIVAN) 0.5 MG tablet Take 1 tablet by mouth 2 (two) times daily.      Marland Kitchen losartan (COZAAR) 100 MG tablet Daily.      . metFORMIN (GLUCOPHAGE) 1000 MG tablet Take 1,000 mg by mouth 2 (two) times daily with a meal.        . metoprolol (LOPRESSOR) 100 MG tablet Take 1.5 tablets by mouth 2 (two) times daily.      . Omega-3 Fatty Acids (FISH OIL) 1000 MG CAPS Take 2 capsules by mouth 2 (two) times daily.        Marland Kitchen oxyCODONE-acetaminophen (PERCOCET) 7.5-325 MG per tablet as needed.      . pravastatin (PRAVACHOL) 40 MG tablet Take 40 mg by mouth daily.         No current  facility-administered medications for this visit.    Allergies  Allergen Reactions  . Codeine     REACTION: sickness  . Contrast Media [Iodinated Diagnostic Agents]   . Morphine     REACTION: sickness    History   Social History  . Marital Status: Divorced    Spouse Name: N/A    Number of Children: 3  . Years of Education: N/A   Occupational History  . retired    Social History Main Topics  . Smoking status: Former Smoker -- 2.00 packs/day for 40 years    Types: Cigarettes    Quit date: 03/22/1996  . Smokeless tobacco: Not on file  . Alcohol Use: No  . Drug Use: No  . Sexual Activity: Not on file   Other Topics Concern  . Not on file   Social History Narrative  . No narrative on file    Family History  Problem Relation Age of Onset  . Bone cancer Mother 42  died  . Heart failure Father 63    died    Review of Systems:  As stated in the HPI and otherwise negative.   BP 162/72  Pulse 72  Ht 5\' 2"  (1.575 m)  Wt 159 lb (72.122 kg)  BMI 29.07 kg/m2  Physical Examination: General: Well developed, well nourished, NAD HEENT: OP clear, mucus membranes moist SKIN: warm, dry. No rashes. Neuro: No focal deficits Musculoskeletal: Muscle strength 5/5 all ext Psychiatric: Mood and affect normal Neck: No JVD, no carotid bruits, no thyromegaly, no lymphadenopathy. Lungs:Clear bilaterally, no wheezes, rhonci, crackles Cardiovascular: Regular rate and rhythm. No murmurs, gallops or rubs. Abdomen:Soft. Bowel sounds present. Non-tender.  Extremities: No lower extremity edema. Pulses are 2 + in the bilateral DP/PT.  Assessment and Plan:   1. CAD: Stable. Continue ASA, statin, beta blocker. With fatigue, will arrange echo to assess LVEF. She has no symptoms to suggest unstable angina.   2. HTN: BP elevated today. She is on high doses of Lopressor, amlodipine and Cozaar. Will add HCTZ 25 mg Daily.   3. Hyperlipidemia: She is on a statin. Lipids followed in primary  care and well controlled.   4. Fatigue: Likely not cardiac related. Will assess LVEF with echo.   5. Insomnia: Will start Ambien 5 mg daily.

## 2013-07-24 ENCOUNTER — Ambulatory Visit (HOSPITAL_COMMUNITY): Payer: Medicare Other | Attending: Cardiology | Admitting: Radiology

## 2013-07-24 DIAGNOSIS — E785 Hyperlipidemia, unspecified: Secondary | ICD-10-CM | POA: Insufficient documentation

## 2013-07-24 DIAGNOSIS — I10 Essential (primary) hypertension: Secondary | ICD-10-CM | POA: Insufficient documentation

## 2013-07-24 DIAGNOSIS — I252 Old myocardial infarction: Secondary | ICD-10-CM | POA: Insufficient documentation

## 2013-07-24 DIAGNOSIS — Z87891 Personal history of nicotine dependence: Secondary | ICD-10-CM | POA: Insufficient documentation

## 2013-07-24 DIAGNOSIS — I2510 Atherosclerotic heart disease of native coronary artery without angina pectoris: Secondary | ICD-10-CM | POA: Insufficient documentation

## 2013-07-24 DIAGNOSIS — R5383 Other fatigue: Secondary | ICD-10-CM

## 2013-07-24 NOTE — Progress Notes (Signed)
Echocardiogram performed.  

## 2013-11-15 DIAGNOSIS — M549 Dorsalgia, unspecified: Secondary | ICD-10-CM | POA: Insufficient documentation

## 2013-11-15 DIAGNOSIS — M791 Myalgia, unspecified site: Secondary | ICD-10-CM | POA: Insufficient documentation

## 2013-11-15 DIAGNOSIS — R809 Proteinuria, unspecified: Secondary | ICD-10-CM | POA: Insufficient documentation

## 2014-01-10 ENCOUNTER — Encounter: Payer: Self-pay | Admitting: Cardiovascular Disease

## 2014-01-10 ENCOUNTER — Ambulatory Visit (INDEPENDENT_AMBULATORY_CARE_PROVIDER_SITE_OTHER): Payer: Medicare Other | Admitting: Cardiovascular Disease

## 2014-01-10 VITALS — BP 174/70 | HR 72 | Ht 62.0 in | Wt 149.8 lb

## 2014-01-10 DIAGNOSIS — I10 Essential (primary) hypertension: Secondary | ICD-10-CM

## 2014-01-10 DIAGNOSIS — R5382 Chronic fatigue, unspecified: Secondary | ICD-10-CM

## 2014-01-10 DIAGNOSIS — E785 Hyperlipidemia, unspecified: Secondary | ICD-10-CM

## 2014-01-10 DIAGNOSIS — G4700 Insomnia, unspecified: Secondary | ICD-10-CM

## 2014-01-10 DIAGNOSIS — I2510 Atherosclerotic heart disease of native coronary artery without angina pectoris: Secondary | ICD-10-CM

## 2014-01-10 MED ORDER — ZOLPIDEM TARTRATE 5 MG PO TABS: 5.0000 mg | ORAL_TABLET | Freq: Every evening | ORAL | Status: DC | PRN

## 2014-01-10 NOTE — Progress Notes (Signed)
History of Present Illness: 73 yo female with history of CAD, HTN, DM, HLD, prior CVA 1996 who is here today for cardiac follow up. She has been followed by Dr. Lia Foyer. She has a history of remote PCI in 1998. Stress myoview February 2012 with no ischemia. She is known to have carotid artery disease and has this followed in primary care.   She is here today for follow up. She has had no chest pain or SOB. No lower ext edema, PND, orthopnea, near syncope or syncope. She does have fatigue which is unchanged.   Primary Care Physician: Alean Rinne  Last Lipid Profile: Followed in primary care, Reviewed in [place] today and lipids well controlled.   Past Medical History  Diagnosis Date  . Hypertension   . Diabetes mellitus     type 2  . Past heart attack 1998  . CAD (coronary artery disease)   . Hyperlipidemia     Past Surgical History  Procedure Laterality Date  . Back surgery      x 2  . Neck surgery    . Skin cancer removal face    . Tonsillectomy    . Appendectomy    . Cesarean section      x 3    Current Outpatient Prescriptions  Medication Sig Dispense Refill  . amLODipine (NORVASC) 10 MG tablet Take 10 mg by mouth daily.        Marland Kitchen aspirin 81 MG tablet Take 81 mg by mouth daily.        Marland Kitchen glipiZIDE (GLUCOTROL) 5 MG tablet Take by mouth. Take 1 tablets Am and 1 tablet PM      . LORazepam (ATIVAN) 0.5 MG tablet Take 1 tablet by mouth 2 (two) times daily.      Marland Kitchen losartan (COZAAR) 100 MG tablet Daily.      . metFORMIN (GLUCOPHAGE) 1000 MG tablet Take 500 mg by mouth 2 (two) times daily with a meal.       . metoprolol (LOPRESSOR) 100 MG tablet Take 1.5 tablets by mouth 2 (two) times daily.      . Omega-3 Fatty Acids (FISH OIL) 1000 MG CAPS Take 2 capsules by mouth 2 (two) times daily.        Marland Kitchen oxyCODONE-acetaminophen (PERCOCET) 7.5-325 MG per tablet as needed.      . pravastatin (PRAVACHOL) 40 MG tablet Take 40 mg by mouth daily.        Marland Kitchen zolpidem (AMBIEN) 5 MG  tablet Take 1 tablet (5 mg total) by mouth at bedtime as needed for sleep.  30 tablet  2   No current facility-administered medications for this visit.    Allergies  Allergen Reactions  . Codeine     REACTION: sickness  . Contrast Media [Iodinated Diagnostic Agents]   . Metrizamide   . Morphine     REACTION: sickness    History   Social History  . Marital Status: Divorced    Spouse Name: N/A    Number of Children: 3  . Years of Education: N/A   Occupational History  . retired    Social History Main Topics  . Smoking status: Former Smoker -- 2.00 packs/day for 40 years    Types: Cigarettes    Quit date: 03/22/1996  . Smokeless tobacco: Not on file  . Alcohol Use: No  . Drug Use: No  . Sexual Activity: Not on file   Other Topics Concern  . Not on file  Social History Narrative  . No narrative on file    Family History  Problem Relation Age of Onset  . Bone cancer Mother 35    died  . Heart failure Father 52    died    Review of Systems:  As stated in the HPI and otherwise negative.   BP 174/70  Pulse 72  Ht 5\' 2"  (1.575 m)  Wt 149 lb 12.8 oz (67.949 kg)  BMI 27.39 kg/m2  Physical Examination: General: Well developed, well nourished, NAD HEENT: OP clear, mucus membranes moist SKIN: warm, dry. No rashes. Neuro: No focal deficits Musculoskeletal: Muscle strength 5/5 all ext Psychiatric: Mood and affect normal Neck: No JVD, no carotid bruits, no thyromegaly, no lymphadenopathy. Lungs:Clear bilaterally, no wheezes, rhonci, crackles Cardiovascular: Regular rate and rhythm. No murmurs, gallops or rubs. Abdomen:Soft. Bowel sounds present. Non-tender.  Extremities: No lower extremity edema. Pulses are 2 + in the bilateral DP/PT.  Echo 07/24/13: Left ventricle: The cavity size was normal. There was mild concentric hypertrophy. Systolic function was vigorous. The estimated ejection fraction was in the range of 65% to 70%. Wall motion was normal; there were  no regional wall motion abnormalities. Doppler parameters are consistent with abnormal left ventricular relaxation (grade 1 diastolic dysfunction). Doppler parameters are consistent with elevated ventricular end-diastolic filling pressure. - Aortic valve: No regurgitation. - Mitral valve: Calcified annulus. Mildly thickened leaflets . - Left atrium: The atrium was at the upper limits of normal in size. - Right ventricle: Systolic function was normal. - Tricuspid valve: No regurgitation. - Pulmonary arteries: Systolic pressure was within the normal range. - Pericardium, extracardiac: There was no pericardial effusion.  EKG: NSR, rate 72 bpm. Poor R wave progression precordial leads.   Assessment and Plan:   1. CAD: Stable. Continue ASA, statin, beta blocker. She has no symptoms to suggest unstable angina.   2. HTN: BP elevated today. She is on high doses of Lopressor, amlodipine, Cozaar.This is managed in primary care. She will follow at home and alert primary care if it remains elevated.   3. Hyperlipidemia: She is on a statin. Lipids followed in primary care and well controlled.   4. Fatigue: Likely not cardiac related. Echo with normal LV function may 2015.   5. Insomnia: Will continue Ambien prn

## 2014-01-10 NOTE — Patient Instructions (Addendum)
Your physician wants you to follow-up in:  12 months.  You will receive a reminder letter in the mail two months in advance. If you don't receive a letter, please call our office to schedule the follow-up appointment.   

## 2014-06-20 DIAGNOSIS — Z1239 Encounter for other screening for malignant neoplasm of breast: Secondary | ICD-10-CM | POA: Insufficient documentation

## 2014-06-20 DIAGNOSIS — G63 Polyneuropathy in diseases classified elsewhere: Secondary | ICD-10-CM | POA: Insufficient documentation

## 2014-08-23 ENCOUNTER — Other Ambulatory Visit: Payer: Self-pay | Admitting: Cardiovascular Disease

## 2014-08-23 NOTE — Telephone Encounter (Signed)
OK to refill

## 2014-08-23 NOTE — Telephone Encounter (Signed)
Per Fraser Din

## 2014-10-21 DIAGNOSIS — M25512 Pain in left shoulder: Secondary | ICD-10-CM | POA: Insufficient documentation

## 2015-02-20 DIAGNOSIS — R5383 Other fatigue: Secondary | ICD-10-CM | POA: Insufficient documentation

## 2015-02-20 DIAGNOSIS — R0902 Hypoxemia: Secondary | ICD-10-CM | POA: Insufficient documentation

## 2015-02-20 NOTE — Progress Notes (Signed)
Chief Complaint  Patient presents with  . Follow-up    pt c/o SOB and very tired    History of Present Illness: 74 yo female with history of CAD, HTN, DM, HLD, prior CVA 1996 who is here today for cardiac follow up. She has been followed by Dr. Lia Foyer. She has a history of remote PCI in 1998. Stress myoview February 2012 with no ischemia. She is known to have carotid artery disease and has this followed in primary care. Echo May 2015 with normal LV systolic function, no significant valve disease.   She is here today for follow up. She has had no chest pain or SOB. No lower ext edema, PND, orthopnea, near syncope or syncope. She does have fatigue. She was seen in primary care yesterday and was noted to be hypoxic. She has no cough, fever, chills or weight gain. Workup is underway in primary care including chest x-ray, labs and referral to pulmonary. I could review the labs through [facility] and her CBC, BMET, TSH, lipids are ok.   Primary Care Physician: Alean Rinne  Last Lipid Profile: Followed in primary care, Reviewed in [place] today and lipids well controlled.   Past Medical History  Diagnosis Date  . Hypertension   . Diabetes mellitus     type 2  . Past heart attack 1998  . CAD (coronary artery disease)   . Hyperlipidemia     Past Surgical History  Procedure Laterality Date  . Back surgery      x 2  . Neck surgery    . Skin cancer removal face    . Tonsillectomy    . Appendectomy    . Cesarean section      x 3    Current Outpatient Prescriptions  Medication Sig Dispense Refill  . amLODipine (NORVASC) 10 MG tablet Take 10 mg by mouth daily.      Marland Kitchen aspirin 81 MG tablet Take 81 mg by mouth daily.      Marland Kitchen gabapentin (NEURONTIN) 400 MG capsule Take 400 mg by mouth daily.    Marland Kitchen glipiZIDE (GLUCOTROL) 5 MG tablet Take by mouth. Take 1 tablets Am and 1 tablet PM    . LORazepam (ATIVAN) 0.5 MG tablet Take 1 tablet by mouth 2 (two) times daily.    Marland Kitchen losartan  (COZAAR) 100 MG tablet Daily.    . metFORMIN (GLUCOPHAGE) 1000 MG tablet Take 500 mg by mouth 2 (two) times daily with a meal.     . metoprolol (LOPRESSOR) 100 MG tablet Take 1.5 tablets by mouth 2 (two) times daily.    . Omega-3 Fatty Acids (FISH OIL) 1000 MG CAPS Take 2 capsules by mouth 2 (two) times daily.      Marland Kitchen oxyCODONE-acetaminophen (PERCOCET) 7.5-325 MG per tablet as needed.    . pravastatin (PRAVACHOL) 40 MG tablet Take 40 mg by mouth daily.      Marland Kitchen zolpidem (AMBIEN) 5 MG tablet Take 1 tablet (5 mg total) by mouth at bedtime as needed. for sleep 30 tablet 4   No current facility-administered medications for this visit.    Allergies  Allergen Reactions  . Codeine     REACTION: sickness  . Contrast Media [Iodinated Diagnostic Agents]   . Metrizamide   . Morphine     REACTION: sickness    Social History   Social History  . Marital Status: Divorced    Spouse Name: N/A  . Number of Children: 3  . Years of Education: N/A  Occupational History  . retired    Social History Main Topics  . Smoking status: Former Smoker -- 2.00 packs/day for 40 years    Types: Cigarettes    Quit date: 03/22/1996  . Smokeless tobacco: Not on file  . Alcohol Use: No  . Drug Use: No  . Sexual Activity: Not on file   Other Topics Concern  . Not on file   Social History Narrative    Family History  Problem Relation Age of Onset  . Bone cancer Mother 9    died  . Heart failure Father 22    died    Review of Systems:  As stated in the HPI and otherwise negative.   BP 130/62 mmHg  Pulse 59  Ht 5\' 2"  (1.575 m)  Wt 154 lb (69.854 kg)  BMI 28.16 kg/m2  SpO2 86%  Physical Examination: General: Well developed, well nourished, NAD HEENT: OP clear, mucus membranes moist SKIN: warm, dry. No rashes. Neuro: No focal deficits Musculoskeletal: Muscle strength 5/5 all ext Psychiatric: Mood and affect normal Neck: No JVD, no carotid bruits, no thyromegaly, no  lymphadenopathy. Lungs:Clear bilaterally, no wheezes, rhonci, crackles Cardiovascular: Regular rate and rhythm. No murmurs, gallops or rubs. Abdomen:Soft. Bowel sounds present. Non-tender.  Extremities: No lower extremity edema. Pulses are 2 + in the bilateral DP/PT.  Echo 07/24/13: Left ventricle: The cavity size was normal. There was mild concentric hypertrophy. Systolic function was vigorous. The estimated ejection fraction was in the range of 65% to 70%. Wall motion was normal; there were no regional wall motion abnormalities. Doppler parameters are consistent with abnormal left ventricular relaxation (grade 1 diastolic dysfunction). Doppler parameters are consistent with elevated ventricular end-diastolic filling pressure. - Aortic valve: No regurgitation. - Mitral valve: Calcified annulus. Mildly thickened leaflets . - Left atrium: The atrium was at the upper limits of normal in size. - Right ventricle: Systolic function was normal. - Tricuspid valve: No regurgitation. - Pulmonary arteries: Systolic pressure was within the normal range. - Pericardium, extracardiac: There was no pericardial effusion.  EKG:  EKG is ordered today. The ekg ordered today demonstrates Sinus bradycardia, rate 59 bpm.   Recent Labs: No results found for requested labs within last 365 days.   Lipid Panel No results found for: CHOL, TRIG, HDL, CHOLHDL, VLDL, LDLCALC, LDLDIRECT   Wt Readings from Last 3 Encounters:  02/21/15 154 lb (69.854 kg)  01/10/14 149 lb 12.8 oz (67.949 kg)  07/17/13 159 lb (72.122 kg)     Other studies Reviewed: Additional studies/ records that were reviewed today include: . Review of the above records demonstrates:    Assessment and Plan:   1. CAD: Stable. Continue ASA, statin, beta blocker.    2. HTN: BP controlled. She is on high doses of Lopressor, amlodipine, Cozaar.   3. Hyperlipidemia: She is on a statin. Lipids followed in primary care and well controlled.    4. Fatigue: Likely not cardiac related. Echo with normal LV function may 2015.   5. Insomnia: Will continue Ambien prn  6. Hypoxia: She has no SOB, cough, fever, She has no weight gain or LE edema. She does have faint crackles on exam in the bases, mostly dry crackles. I do not get a sense that she is volume overloaded. She is getting a chest x-ray today per primary care and will be referred to pulmonary.   Current medicines are reviewed at length with the patient today.  The patient does not have concerns regarding medicines.  The  following changes have been made:  no change  Labs/ tests ordered today include:   Orders Placed This Encounter  Procedures  . EKG 12-Lead     Disposition:   FU with me in 6 months   Signed, Lauree Chandler, MD 02/21/2015 12:32 PM    [facility] [place], [place], [place]  [address] Phone: [phone]; Fax: [phone]

## 2015-02-21 ENCOUNTER — Ambulatory Visit (INDEPENDENT_AMBULATORY_CARE_PROVIDER_SITE_OTHER): Payer: Medicare Other | Admitting: Cardiovascular Disease

## 2015-02-21 ENCOUNTER — Encounter: Payer: Self-pay | Admitting: Cardiovascular Disease

## 2015-02-21 VITALS — BP 130/62 | HR 59 | Ht 62.0 in | Wt 154.0 lb

## 2015-02-21 DIAGNOSIS — I10 Essential (primary) hypertension: Secondary | ICD-10-CM | POA: Diagnosis not present

## 2015-02-21 DIAGNOSIS — E785 Hyperlipidemia, unspecified: Secondary | ICD-10-CM

## 2015-02-21 DIAGNOSIS — I2510 Atherosclerotic heart disease of native coronary artery without angina pectoris: Secondary | ICD-10-CM

## 2015-02-21 DIAGNOSIS — G4700 Insomnia, unspecified: Secondary | ICD-10-CM | POA: Diagnosis not present

## 2015-02-21 MED ORDER — ZOLPIDEM TARTRATE 5 MG PO TABS: 5.0000 mg | ORAL_TABLET | Freq: Every evening | ORAL | Status: DC | PRN

## 2015-02-21 NOTE — Patient Instructions (Signed)

## 2015-03-04 DIAGNOSIS — J449 Chronic obstructive pulmonary disease, unspecified: Secondary | ICD-10-CM | POA: Insufficient documentation

## 2015-04-02 DIAGNOSIS — J0100 Acute maxillary sinusitis, unspecified: Secondary | ICD-10-CM | POA: Insufficient documentation

## 2015-07-15 ENCOUNTER — Other Ambulatory Visit: Payer: Self-pay | Admitting: Cardiovascular Disease

## 2015-07-15 NOTE — Telephone Encounter (Signed)
OK to refill

## 2015-08-30 DIAGNOSIS — I639 Cerebral infarction, unspecified: Secondary | ICD-10-CM | POA: Insufficient documentation

## 2015-09-11 DIAGNOSIS — L0201 Cutaneous abscess of face: Secondary | ICD-10-CM | POA: Insufficient documentation

## 2015-09-11 DIAGNOSIS — R110 Nausea: Secondary | ICD-10-CM | POA: Insufficient documentation

## 2015-11-25 ENCOUNTER — Telehealth: Payer: Self-pay | Admitting: *Deleted

## 2015-11-26 ENCOUNTER — Other Ambulatory Visit: Payer: Self-pay | Admitting: *Deleted

## 2015-11-26 MED ORDER — ZOLPIDEM TARTRATE 5 MG PO TABS: 5.0000 mg | ORAL_TABLET | Freq: Every evening | ORAL | 3 refills | Status: DC | PRN

## 2015-11-26 NOTE — Telephone Encounter (Signed)
OK to refill Ambien

## 2015-11-26 NOTE — Telephone Encounter (Signed)
Pt's medication has been refilled and sent to pt's pharmacy as requested.

## 2015-11-28 ENCOUNTER — Encounter: Payer: Self-pay | Admitting: Physician Assistant

## 2015-12-15 ENCOUNTER — Ambulatory Visit (INDEPENDENT_AMBULATORY_CARE_PROVIDER_SITE_OTHER): Payer: Medicare Other | Admitting: Physician Assistant

## 2015-12-15 ENCOUNTER — Encounter: Payer: Self-pay | Admitting: Physician Assistant

## 2015-12-15 ENCOUNTER — Encounter (INDEPENDENT_AMBULATORY_CARE_PROVIDER_SITE_OTHER): Payer: Self-pay

## 2015-12-15 VITALS — BP 140/74 | HR 79 | Ht 62.0 in | Wt 152.0 lb

## 2015-12-15 DIAGNOSIS — R5382 Chronic fatigue, unspecified: Secondary | ICD-10-CM | POA: Diagnosis not present

## 2015-12-15 DIAGNOSIS — I2510 Atherosclerotic heart disease of native coronary artery without angina pectoris: Secondary | ICD-10-CM

## 2015-12-15 DIAGNOSIS — I10 Essential (primary) hypertension: Secondary | ICD-10-CM

## 2015-12-15 DIAGNOSIS — E785 Hyperlipidemia, unspecified: Secondary | ICD-10-CM

## 2015-12-15 DIAGNOSIS — I63411 Cerebral infarction due to embolism of right middle cerebral artery: Secondary | ICD-10-CM

## 2015-12-15 NOTE — Progress Notes (Signed)
Cardiology Office Note    Date:  12/15/2015   ID:  [name], [name] 13-May-1940, MRN [id number]  PCP:  Coy Saunas, MD  Cardiologist:   No chief complaint on file.   History of Present Illness:  Catherine Greene is a 75 y.o. female with history of CAD, HTN, DM, HLD, prior CVA 1996 who is here today for cardiac follow up. She has a history of remote PCI in 1998. Stress myoview February 2012 with no ischemia. She is known to have carotid artery disease and has this followed in primary care. Echo May 2015 with normal LV systolic function, no significant valve disease.   Last saw Dr. Angelena Greene 02/2015 was being worked up for hypoxia and referred to pulmonologist.   In June 2017 patient suffered an Ischemic Stroke s/p IV alteplase; left hemiparesis at [facility]. Records reviewed from care everywhere. She underwent rehabilitation and recovered pretty well. She still has some left-sided weakness but her overall strength did improve. Echocardiogram 09/02/15 showed normal LV systolic function [id number] with grade 2 DD, degenerative mitral valve disease, aortic sclerosis and a mildly dilated RV. 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 Blood pressure was 0000000 systolic and believe. Source of stroke was not identified. She was sent home on aspirin.  Patient complains of extreme fatigue. She doesn't feel like doing anything. She has had a lot of anxiety and was placed on Ativan 0.5 mg 1 tablet twice a day. She is so nervous in the morning that she's been taken 2 tablets in the morning. She has a son who is in a nursing home with a stroke and another son who had an MI that she is worried about. Denies any chest pain, palpitations, dyspnea, dyspnea on exertion, dizziness or presyncope.         Past Medical History:  Diagnosis Date  . CAD (coronary artery disease)   . Diabetes mellitus    type 2  . Hyperlipidemia   . Hypertension   . Past heart attack 1998    Past  Surgical History:  Procedure Laterality Date  . APPENDECTOMY    . BACK SURGERY     x 2  . CESAREAN SECTION     x 3  . NECK SURGERY    . Skin cancer removal face    . TONSILLECTOMY      Current Medications: Outpatient Medications Prior to Visit  Medication Sig Dispense Refill  . amLODipine (NORVASC) 10 MG tablet Take 10 mg by mouth daily.      Marland Kitchen aspirin 81 MG tablet Take 81 mg by mouth daily.      Marland Kitchen gabapentin (NEURONTIN) 400 MG capsule Take 400 mg by mouth daily.    Marland Kitchen glipiZIDE (GLUCOTROL) 5 MG tablet Take by mouth. Take 1 tablets Am and 1 tablet PM    . LORazepam (ATIVAN) 0.5 MG tablet Take 1 tablet by mouth 2 (two) times daily.    Marland Kitchen losartan (COZAAR) 100 MG tablet Take 100 mg by mouth Daily.     . metFORMIN (GLUCOPHAGE) 1000 MG tablet Take 500 mg by mouth 2 (two) times daily with a meal.     . metoprolol (LOPRESSOR) 100 MG tablet Take 1.5 tablets by mouth 2 (two) times daily.    . Omega-3 Fatty Acids (FISH OIL) 1000 MG CAPS Take 2 capsules by mouth 2 (two) times daily.      Marland Kitchen oxyCODONE-acetaminophen (PERCOCET) 7.5-325 MG per tablet Take 1 tablet by mouth every  6 (six) hours as needed for severe pain.     . pravastatin (PRAVACHOL) 40 MG tablet Take 40 mg by mouth daily.      Marland Kitchen zolpidem (AMBIEN) 5 MG tablet Take 1 tablet (5 mg total) by mouth at bedtime as needed. for sleep 30 tablet 3   No facility-administered medications prior to visit.      Allergies:   Codeine; Contrast media [iodinated diagnostic agents]; Metrizamide; and Morphine   Social History   Social History  . Marital status: Divorced    Spouse name: N/A  . Number of children: 3  . Years of education: N/A   Occupational History  . retired Retired   Social History Main Topics  . Smoking status: Former Smoker    Packs/day: 2.00    Years: 40.00    Types: Cigarettes    Quit date: 03/22/1996  . Smokeless tobacco: Never Used  . Alcohol use No  . Drug use: No  . Sexual activity: Not on file   Other Topics  Concern  . Not on file   Social History Narrative  . No narrative on file     Family History:  The patient's  family history includes Bone cancer (age of onset: 40) in her mother; Heart failure (age of onset: 65) in her father.   ROS:   Please see the history of present illness.    Review of Systems  Constitution: Positive for malaise/fatigue.  HENT: Negative.   Eyes: Negative.   Cardiovascular: Negative.   Respiratory: Negative.   Hematologic/Lymphatic: Bruises/bleeds easily.  Musculoskeletal: Positive for muscle weakness and myalgias. Negative for joint pain.  Gastrointestinal: Positive for diarrhea.       Occasional diarrhea  Genitourinary: Negative.   Neurological: Negative.   Psychiatric/Behavioral: Positive for depression. The patient is nervous/anxious.    All other systems reviewed and are negative.   PHYSICAL EXAM:   VS:  BP 140/74   Pulse 79   Ht 5\' 2"  (1.575 m)   Wt 152 lb (68.9 kg)   BMI 27.80 kg/m   Physical Exam  GEN: Well nourished, well developed, in no acute distress, Poor dentition  Neck: no JVD, carotid bruits, or masses Cardiac:RRR; 1/6 systolic murmur left sternal border, no rubs, or gallops  Respiratory:  Few crackles at the right base  GI: soft, nontender, nondistended, + BS Ext: without cyanosis, clubbing, or edema, Good distal pulses bilaterally MS: no deformity or atrophy  Skin: warm and dry, no rash Neuro: Some left-sided weakness, walks with a cane Psych: Tired and lethargic  Wt Readings from Last 3 Encounters:  12/15/15 152 lb (68.9 kg)  02/21/15 154 lb (69.9 kg)  01/10/14 149 lb 12.8 oz (67.9 kg)      Studies/Labs Reviewed:   EKG:  EKG is  ordered today.  The ekg ordered today demonstrates Normal sinus rhythm poor R wave progression anteriorly, left anterior fascicular block, no acute change  Recent Labs: No results found for requested labs within last 8760 hours.   Lipid Panel No results found for: CHOL, TRIG, HDL, CHOLHDL,  VLDL, LDLCALC, LDLDIRECT  Additional studies/ records that were reviewed today include:  Echo 07/24/13: Left ventricle: The cavity size was normal. There was mild concentric hypertrophy. Systolic function was vigorous. The estimated ejection fraction was in the range of 65% to 70%. Wall motion was normal; there were no regional wall motion abnormalities. Doppler parameters are consistent with abnormal left ventricular relaxation (grade 1 diastolic dysfunction). Doppler parameters are consistent with  elevated ventricular end-diastolic filling pressure. - Aortic valve: No regurgitation. - Mitral valve: Calcified annulus. Mildly thickened leaflets . - Left atrium: The atrium was at the upper limits of normal in size. - Right ventricle: Systolic function was normal. - Tricuspid valve: No regurgitation. - Pulmonary arteries: Systolic pressure was within the normal range. - Pericardium, extracardiac: There was no pericardial effusion.  Transthoracic echocardiogram, 09/02/15:  Normal left ventricular systolic function, ejection fraction 60 to 65%  Normal right ventricular systolic function  Diastolic dysfunction - grade II (elevated filling pressures)  Degenerative mitral valve disease  Mitral annular calcification  Aortic sclerosis  Dilated right ventricle - mild  Carotid Dopplers, 08/31/15:  Right: CCA prox No plaque visualized. CCA mid No plaque visualized. CCA dist No plaque visualized. ICA prox < 10% narrowing by image. ECAAppears occluded.  Left: CCA prox No plaque visualized. CCA mid No plaque visualized. CCA dist No plaque visualized. ICA prox Appears occluded. ICA midAppears occluded. ICA dist Not visualized.  ECA No plaque visualized.  Non-contrasted CT scan of the head, 08/30/15:  Increased hypodensity along right MCA distribution may represent acute infarction. No hemorrhage. Global cerebral atrophy and sequela of chronic small vessel disease.  Non-contrasted CT  scan of the head, 08/31/15:  No large vessel infarct. No hemorrhage.        ASSESSMENT:    1. Atherosclerosis of native coronary artery of native heart without angina pectoris   2. Essential hypertension, benign   3. Chronic fatigue   4. Hyperlipidemia   5. Cerebrovascular accident (CVA) due to embolism of right middle cerebral artery (HCC)      PLAN:  In order of problems listed above:  CAD without angina. Continue aspirin, statin, beta blocker. F/u with Dr. Angelena Greene in 4 months.  Essential hypertension blood pressure controlled  Chronic fatigue that seems to have worsened. Suspect it could be due to her taking 2 tablets of Ativan every morning. She is also on high-dose metoprolol 150 mg twice a day. Could consider decreasing this but would have to monitor her blood pressure closely because of 2 prior strokes. Would probably need another antihypertensive agent. Consider decreasing Ativan dose and await TSH results that were drawn by her primary care before changing her meds.  Hyperlipidemia on Lipitor. Followed by primary care   Ischemic Stroke s/p IV alteplase; left hemiparesis at [facility] 08/2015. Pretty good recovery. On aspirin. 2-D echo without source of embolus.  Medication Adjustments/Labs and Tests Ordered: Current medicines are reviewed at length with the patient today.  Concerns regarding medicines are outlined above.  Medication changes, Labs and Tests ordered today are listed in the Patient Instructions below. Patient Instructions  Medication Instructions:  Your physician recommends that you continue on your current medications as directed. Please refer to the Current Medication list given to you today.   Labwork: None ordered  Testing/Procedures: None ordered  Follow-Up: Your physician recommends that you schedule a follow-up appointment in: 3-4 months with Dr.Mcalhany  Any Other Special Instructions Will Be Listed Below (If Applicable).     If you need a  refill on your cardiac medications before your next appointment, please call your pharmacy.      Sumner Boast, PA-C  12/15/2015 2:07 PM    [facility] [place], [address], [place]  [address] Phone: [phone]; Fax: [phone]

## 2015-12-15 NOTE — Patient Instructions (Signed)
Medication Instructions:  Your physician recommends that you continue on your current medications as directed. Please refer to the Current Medication list given to you today.   Labwork: None ordered  Testing/Procedures: None ordered  Follow-Up: Your physician recommends that you schedule a follow-up appointment in: 3-4 months with Dr.Mcalhany  Any Other Special Instructions Will Be Listed Below (If Applicable).     If you need a refill on your cardiac medications before your next appointment, please call your pharmacy.

## 2016-02-02 DIAGNOSIS — G8929 Other chronic pain: Secondary | ICD-10-CM | POA: Insufficient documentation

## 2016-02-02 DIAGNOSIS — M25511 Pain in right shoulder: Secondary | ICD-10-CM

## 2016-03-09 NOTE — Progress Notes (Signed)
Chief Complaint  Patient presents with  . Hypertension,benign    History of Present Illness: 75 yo female with history of CAD, HTN, DM, HLD, prior CVA 1996 and 2017 who is here today for cardiac follow up. She has a history of remote PCI in 1998. Stress myoview February 2012 with no ischemia. She is known to have carotid artery disease and has this followed in primary care. Echo May 2015 with normal LV systolic function, no significant valve disease. She had a CVA in June 2017 and was given lytics at [facility]. Echo June 2017 with normal LV systolic function, [id number] with grade 2 diastolic dysfunction. Carotid dopplers with minimal plaque RICA, chronically occluded LICA. She was discharged home on ASA.   She is here today for follow up. She has had no chest pain or SOB. No lower ext edema, PND, orthopnea, near syncope or syncope.   Primary Care Physician: Coy Saunas, MD  Past Medical History:  Diagnosis Date  . CAD (coronary artery disease)   . Diabetes mellitus    type 2  . Hyperlipidemia   . Hypertension   . Past heart attack 1998    Past Surgical History:  Procedure Laterality Date  . APPENDECTOMY    . BACK SURGERY     x 2  . CESAREAN SECTION     x 3  . NECK SURGERY    . Skin cancer removal face    . TONSILLECTOMY      Current Outpatient Prescriptions  Medication Sig Dispense Refill  . amLODipine (NORVASC) 10 MG tablet Take 10 mg by mouth daily.      Marland Kitchen aspirin 81 MG tablet Take 81 mg by mouth daily.      Marland Kitchen gabapentin (NEURONTIN) 400 MG capsule Take 400 mg by mouth daily.    Marland Kitchen glipiZIDE (GLUCOTROL) 5 MG tablet Take by mouth. Take 1 tablets Am and 1 tablet PM    . LORazepam (ATIVAN) 0.5 MG tablet Take 1 tablet by mouth 2 (two) times daily.    Marland Kitchen losartan (COZAAR) 100 MG tablet Take 100 mg by mouth Daily.     . metFORMIN (GLUCOPHAGE) 1000 MG tablet Take 500 mg by mouth 2 (two) times daily with a meal.     . metoprolol (LOPRESSOR) 100 MG tablet Take 1.5 tablets by mouth 2  (two) times daily.    . Omega-3 Fatty Acids (FISH OIL) 1000 MG CAPS Take 2 capsules by mouth 2 (two) times daily.      Marland Kitchen oxyCODONE-acetaminophen (PERCOCET) 7.5-325 MG per tablet Take 1 tablet by mouth every 6 (six) hours as needed for severe pain.     . pravastatin (PRAVACHOL) 40 MG tablet Take 40 mg by mouth daily.      Marland Kitchen zolpidem (AMBIEN) 5 MG tablet Take 1 tablet (5 mg total) by mouth at bedtime as needed. for sleep 30 tablet 3   No current facility-administered medications for this visit.     Allergies  Allergen Reactions  . Codeine     REACTION: sickness  . Contrast Media [Iodinated Diagnostic Agents]   . Metrizamide   . Morphine     REACTION: sickness    Social History   Social History  . Marital status: Divorced    Spouse name: N/A  . Number of children: 3  . Years of education: N/A   Occupational History  . retired Retired   Social History Main Topics  . Smoking status: Former Smoker    Packs/day: 2.00  Years: 40.00    Types: Cigarettes    Quit date: 03/22/1996  . Smokeless tobacco: Never Used  . Alcohol use No  . Drug use: No  . Sexual activity: Not on file   Other Topics Concern  . Not on file   Social History Narrative  . No narrative on file    Family History  Problem Relation Age of Onset  . Bone cancer Mother 81    died  . Heart failure Father 98    died    Review of Systems:  As stated in the HPI and otherwise negative.   BP (!) 160/66   Pulse 80   Ht 5\' 1"  (1.549 m)   Wt 151 lb (68.5 kg)   BMI 28.53 kg/m   Physical Examination: General: Well developed, well nourished, NAD  HEENT: OP clear, mucus membranes moist  SKIN: warm, dry. No rashes. Neuro: No focal deficits  Musculoskeletal: Muscle strength 5/5 all ext  Psychiatric: Mood and affect normal  Neck: No JVD, no carotid bruits, no thyromegaly, no lymphadenopathy.  Lungs:Clear bilaterally, no wheezes, rhonci, crackles Cardiovascular: Regular rate and rhythm. No murmurs, gallops  or rubs. Abdomen:Soft. Bowel sounds present. Non-tender.  Extremities: No lower extremity edema. Pulses are 2 + in the bilateral DP/PT.  Echo 07/24/13: Left ventricle: The cavity size was normal. There was mild concentric hypertrophy. Systolic function was vigorous. The estimated ejection fraction was in the range of 65% to 70%. Wall motion was normal; there were no regional wall motion abnormalities. Doppler parameters are consistent with abnormal left ventricular relaxation (grade 1 diastolic dysfunction). Doppler parameters are consistent with elevated ventricular end-diastolic filling pressure. - Aortic valve: No regurgitation. - Mitral valve: Calcified annulus. Mildly thickened leaflets . - Left atrium: The atrium was at the upper limits of normal in size. - Right ventricle: Systolic function was normal. - Tricuspid valve: No regurgitation. - Pulmonary arteries: Systolic pressure was within the normal range. - Pericardium, extracardiac: There was no pericardial effusion.  EKG:  EKG is not ordered today. The ekg ordered today demonstrates   Recent Labs: No results found for requested labs within last 8760 hours.   Lipid Panel No results found for: CHOL, TRIG, HDL, CHOLHDL, VLDL, LDLCALC, LDLDIRECT   Wt Readings from Last 3 Encounters:  03/10/16 151 lb (68.5 kg)  12/15/15 152 lb (68.9 kg)  02/21/15 154 lb (69.9 kg)     Other studies Reviewed: Additional studies/ records that were reviewed today include: . Review of the above records demonstrates:    Assessment and Plan:   1. CAD without angina: No chest pain suggestive of angina. Continue ASA, statin, beta blocker.    2. HTN: BP slightly elevated today.  She is on high doses of Lopressor, amlodipine, Cozaar. This is followed in primary care. No changes today.   3. Hyperlipidemia: She is on a statin. Lipids followed in primary care and well controlled per pt.   4. Insomnia: Will refill Ambien to use prn.    Current medicines are reviewed at length with the patient today.  The patient does not have concerns regarding medicines.  The following changes have been made:  no change  Labs/ tests ordered today include:   No orders of the defined types were placed in this encounter.    Disposition:   FU with me in 12 months   Signed, Lauree Chandler, MD 03/10/2016 3:30 PM    [facility] [place], [place], [place]  [address]  Phone: [phone]; Fax: [phone]

## 2016-03-10 ENCOUNTER — Encounter (INDEPENDENT_AMBULATORY_CARE_PROVIDER_SITE_OTHER): Payer: Self-pay

## 2016-03-10 ENCOUNTER — Ambulatory Visit (INDEPENDENT_AMBULATORY_CARE_PROVIDER_SITE_OTHER): Payer: Medicare Other | Admitting: Cardiovascular Disease

## 2016-03-10 ENCOUNTER — Encounter: Payer: Self-pay | Admitting: Cardiovascular Disease

## 2016-03-10 VITALS — BP 160/66 | HR 80 | Ht 61.0 in | Wt 151.0 lb

## 2016-03-10 DIAGNOSIS — E7800 Pure hypercholesterolemia, unspecified: Secondary | ICD-10-CM

## 2016-03-10 DIAGNOSIS — F5101 Primary insomnia: Secondary | ICD-10-CM | POA: Diagnosis not present

## 2016-03-10 DIAGNOSIS — I10 Essential (primary) hypertension: Secondary | ICD-10-CM | POA: Diagnosis not present

## 2016-03-10 DIAGNOSIS — I2510 Atherosclerotic heart disease of native coronary artery without angina pectoris: Secondary | ICD-10-CM | POA: Diagnosis not present

## 2016-03-10 MED ORDER — ZOLPIDEM TARTRATE 5 MG PO TABS
5.0000 mg | ORAL_TABLET | Freq: Every evening | ORAL | 3 refills | Status: DC | PRN
Start: 2016-03-10 — End: 2016-07-14

## 2016-03-10 NOTE — Patient Instructions (Signed)

## 2016-05-03 DIAGNOSIS — J069 Acute upper respiratory infection, unspecified: Secondary | ICD-10-CM | POA: Insufficient documentation

## 2016-05-03 DIAGNOSIS — Z79899 Other long term (current) drug therapy: Secondary | ICD-10-CM | POA: Insufficient documentation

## 2016-07-14 ENCOUNTER — Other Ambulatory Visit: Payer: Self-pay | Admitting: Cardiovascular Disease

## 2016-07-14 NOTE — Telephone Encounter (Signed)
Pharmacy requesting a refill on zolpidem 5 mg tablet. Would you like to refill this medication? Please advise

## 2016-07-16 NOTE — Telephone Encounter (Signed)
OK to refill.  Called to pharmacy. 30 tablets. 2 refills

## 2016-08-31 DIAGNOSIS — F341 Dysthymic disorder: Secondary | ICD-10-CM | POA: Insufficient documentation

## 2016-10-12 DIAGNOSIS — E538 Deficiency of other specified B group vitamins: Secondary | ICD-10-CM | POA: Insufficient documentation

## 2016-10-12 DIAGNOSIS — Z5320 Procedure and treatment not carried out because of patient's decision for unspecified reasons: Secondary | ICD-10-CM | POA: Insufficient documentation

## 2016-10-12 DIAGNOSIS — Z5329 Procedure and treatment not carried out because of patient's decision for other reasons: Secondary | ICD-10-CM | POA: Insufficient documentation

## 2016-10-16 ENCOUNTER — Other Ambulatory Visit: Payer: Self-pay | Admitting: Cardiovascular Disease

## 2016-10-18 NOTE — Telephone Encounter (Signed)
Pt's pharmacy requesting a refill on Zolpidem. Would you like to refill this medication? Please advise

## 2016-10-18 NOTE — Telephone Encounter (Signed)
OK to refill

## 2017-02-14 ENCOUNTER — Other Ambulatory Visit: Payer: Self-pay | Admitting: Cardiovascular Disease

## 2017-02-16 NOTE — Telephone Encounter (Signed)
OK to refill. Thanks  [employer]

## 2017-05-16 ENCOUNTER — Other Ambulatory Visit: Payer: Self-pay | Admitting: Cardiovascular Disease

## 2017-05-16 NOTE — Telephone Encounter (Signed)
Please ask primary care to refill.  Pt has seen them recently.

## 2017-05-18 ENCOUNTER — Other Ambulatory Visit: Payer: Self-pay | Admitting: Cardiovascular Disease

## 2017-05-18 NOTE — Addendum Note (Signed)
Addended by: De Burrs on: 05/18/2017 02:55 PM   Modules accepted: Orders

## 2017-05-20 ENCOUNTER — Other Ambulatory Visit: Payer: Self-pay | Admitting: Cardiovascular Disease

## 2017-05-26 ENCOUNTER — Other Ambulatory Visit: Payer: Self-pay | Admitting: Cardiovascular Disease

## 2017-05-27 NOTE — Telephone Encounter (Signed)
Please see previous refill note dated 05/16/17.   Please ask primary care to refill.  Pt has seen them recently.

## 2017-05-30 ENCOUNTER — Other Ambulatory Visit: Payer: Self-pay | Admitting: Cardiovascular Disease

## 2017-05-30 NOTE — Telephone Encounter (Signed)
OK to refill. cdm 

## 2017-06-03 NOTE — Telephone Encounter (Signed)
Ambien has been refilled by primary care.--Dr. Rosana Hoes.  I spoke with pharmacist at [facility] who confirms they have received refill from primary care and none from our office.

## 2017-07-29 ENCOUNTER — Ambulatory Visit: Payer: Medicare Other | Admitting: Cardiovascular Disease

## 2017-08-18 ENCOUNTER — Encounter: Payer: Self-pay | Admitting: Cardiology

## 2017-08-18 ENCOUNTER — Ambulatory Visit: Payer: Medicare Other | Admitting: Cardiology

## 2017-08-18 ENCOUNTER — Encounter (INDEPENDENT_AMBULATORY_CARE_PROVIDER_SITE_OTHER): Payer: Self-pay

## 2017-08-18 VITALS — BP 154/68 | HR 71 | Ht 62.0 in | Wt 147.0 lb

## 2017-08-18 DIAGNOSIS — I10 Essential (primary) hypertension: Secondary | ICD-10-CM | POA: Diagnosis not present

## 2017-08-18 MED ORDER — METOPROLOL TARTRATE 100 MG PO TABS: 200.0000 mg | ORAL_TABLET | Freq: Two times a day (BID) | ORAL | 3 refills | Status: DC

## 2017-08-18 NOTE — Progress Notes (Signed)
Entered in error

## 2017-08-18 NOTE — Progress Notes (Signed)
08/18/2017 Catherine Greene   20-Mar-1941  [id number]  Primary Physician Coy Saunas, MD Primary Cardiologist: Dr. Angelena Form  Reason for Visit/CC: 12 month f/u for CAD  HPI:  Catherine Greene is a 77 y.o. female, followed by Dr. Angelena Form (former pt of Dr. Lia Foyer), who presents to clinic today for routine cardiovascular examination.  She has a past medical history significant for coronary artery disease, carotid artery disease, prior CVA in 1996 and 2017 (treated with lytics at [facility]), hypertension, hyperlipidemia and diabetes.  She has a remote history of PCI in 1998.  In 2012, she had a stress Myoview which was negative for ischemia.  Her last echocardiogram was in June 2017 at [facility] which showed normal LV function with an EF of 60 to 65% with grade 2 diastolic dysfunction.  Last carotid Doppler showed minimal plaque in the RICA and chronically occluded left ICA. This is followed by primary care. Her PCP also follows her lipids. Most recent lipid panel showed controlled LDL at 56 mg/dL (results in Care Everywhere).   She is here today for routine yearly follow-up. She notes that she is doing well. No anginal symptomatology.  No Chest pain.  No dyspnea. Reports full med compliance. HR in the 70s, BP elevated in the 948N systolic.  She notes blood pressure is chronically elevated.  She says that this is her usual baseline.  She denies any recent strokelike symptoms.  No lower extremity edema, orthopnea nor PND.  She denies lightheadedness, dizziness.  She notes that she is struggling with depression.  This is followed by her PCP.  She is now on Buspar. Started this 2 weeks ago. She lives at home with her daughter and boyfriend of 20 years.  No suicidal or homicidal ideation.  Cardiac Studies  Carotid dopplers (Care Everywhere) 08/2015 Final Interpretation Right Carotid: There is evidence in the ICA of a less than 40% stenosis.  Non-hemodynamically significant plaque noted in the CCA.  The ECA  appears occluded. Left Carotid: The ICA appears occluded. Vertebrals:Both vertebral arteries were patent with antegrade flow. Subclavians: Normal flow hemodynamics were seen in bilateral subclavian  arteries.  [facility]D [facility] 08/2015 (Care Everywhere) Component Name Value Ref Range  LV Diastolic Diameter PLAX 4.62 cm  LV Systolic Diameter PLAX 7.03 cm  IVS Diastolic Thickness 5.00 cm  LVPW Diastolic Thickness 9.38 cm  Mitral E to A Ratio 0.70   Mitral E Point Velocity 0.01 m/s  Mitral A Point Velocity 0.01 m/s  Aortic Root Diameter 1.82 cm  LA Systolic Diameter LX 9.93 cm  LA Area 4C View 14.30 cm2  LA Area 2C View 17.70 cm2  AV Peak Velocity 1.26 m/s  AV Peak Gradient 6.00   PV Peak Velocity 1.02 m/s  PV peak gradient 4.00 mmHg   Result Narrative   Normal left ventricular systolic function, ejection fraction 60 to 71%  Diastolic dysfunction - grade II (elevated filling pressures)  Degenerative mitral valve disease  Mitral annular calcification  Aortic sclerosis  Normal right ventricular systolic function    NST 08/9676 Overall Impression  Exercise Capacity: Lexiscan with no exercise. BP Response: Normal blood pressure response. Clinical Symptoms: Cough ECG Impression: No significant ST segment change suggestive of ischemia. Overall Impression: Normal stress nuclear study.   Current Meds  Medication Sig  . amLODipine (NORVASC) 10 MG tablet Take 10 mg by mouth daily.    Marland Kitchen aspirin 81 MG tablet Take 81 mg by mouth daily.    Marland Kitchen gabapentin (NEURONTIN) 400  MG capsule Take 400 mg by mouth daily.  Marland Kitchen glipiZIDE (GLUCOTROL) 5 MG tablet Take by mouth. Take 1 tablets Am and 1 tablet PM  . losartan (COZAAR) 100 MG tablet Take 100 mg by mouth Daily.   . metFORMIN (GLUCOPHAGE) 1000 MG tablet Take 500 mg by mouth 2 (two) times daily with a meal.   . metoprolol (LOPRESSOR) 100 MG tablet Take 1.5 tablets by mouth 2 (two) times daily.  . Omega-3  Fatty Acids (FISH OIL) 1000 MG CAPS Take 2 capsules by mouth 2 (two) times daily.    Marland Kitchen oxyCODONE-acetaminophen (PERCOCET) 7.5-325 MG per tablet Take 1 tablet by mouth every 6 (six) hours as needed for severe pain.   . pravastatin (PRAVACHOL) 40 MG tablet Take 40 mg by mouth daily.    . [DISCONTINUED] LORazepam (ATIVAN) 0.5 MG tablet Take 1 tablet by mouth 2 (two) times daily.   Allergies  Allergen Reactions  . Codeine     REACTION: sickness  . Contrast Media [Iodinated Diagnostic Agents]   . Metrizamide   . Morphine     REACTION: sickness   Past Medical History:  Diagnosis Date  . CAD (coronary artery disease)   . Diabetes mellitus    type 2  . Hyperlipidemia   . Hypertension   . Past heart attack 1998   Family History  Problem Relation Age of Onset  . Bone cancer Mother 64       died  . Heart failure Father 50       died   Past Surgical History:  Procedure Laterality Date  . APPENDECTOMY    . BACK SURGERY     x 2  . CESAREAN SECTION     x 3  . NECK SURGERY    . Skin cancer removal face    . TONSILLECTOMY     Social History   Socioeconomic History  . Marital status: Divorced    Spouse name: Not on file  . Number of children: 3  . Years of education: Not on file  . Highest education level: Not on file  Occupational History  . Occupation: retired    Fish farm manager: RETIRED  Social Needs  . Financial resource strain: Not on file  . Food insecurity:    Worry: Not on file    Inability: Not on file  . Transportation needs:    Medical: Not on file    Non-medical: Not on file  Tobacco Use  . Smoking status: Former Smoker    Packs/day: 2.00    Years: 40.00    Pack years: 80.00    Types: Cigarettes    Last attempt to quit: 03/22/1996    Years since quitting: 21.4  . Smokeless tobacco: Never Used  Substance and Sexual Activity  . Alcohol use: No  . Drug use: No  . Sexual activity: Not on file  Lifestyle  . Physical activity:    Days per week: Not on file     Minutes per session: Not on file  . Stress: Not on file  Relationships  . Social connections:    Talks on phone: Not on file    Gets together: Not on file    Attends religious service: Not on file    Active member of club or organization: Not on file    Attends meetings of clubs or organizations: Not on file    Relationship status: Not on file  . Intimate partner violence:    Fear of current or ex partner:  Not on file    Emotionally abused: Not on file    Physically abused: Not on file    Forced sexual activity: Not on file  Other Topics Concern  . Not on file  Social History Narrative  . Not on file     Review of Systems: General: negative for chills, fever, night sweats or weight changes.  Cardiovascular: negative for chest pain, dyspnea on exertion, edema, orthopnea, palpitations, paroxysmal nocturnal dyspnea or shortness of breath Dermatological: negative for rash Respiratory: negative for cough or wheezing Urologic: negative for hematuria Abdominal: negative for nausea, vomiting, diarrhea, bright red blood per rectum, melena, or hematemesis Neurologic: negative for visual changes, syncope, or dizziness All other systems reviewed and are otherwise negative except as noted above.   Physical Exam:  Height 5\' 2"  (1.575 m), weight 147 lb (66.7 kg).  General appearance: alert, cooperative and no distress Neck: no carotid bruit and no JVD Lungs: clear to auscultation bilaterally Heart: regular rate and rhythm, S1, S2 normal, no murmur, click, rub or gallop Extremities: extremities normal, atraumatic, no cyanosis or edema Pulses: 2+ and symmetric Skin: Skin color, texture, turgor normal. No rashes or lesions Neurologic: Grossly normal  EKG sinus rhythm-- personally reviewed   ASSESSMENT AND PLAN:   1.  CAD: History of remote PCI in 1998.  Stable without chest pain.  Continue medical therapy.  Aspirin, statin and beta-blocker.  2.  Carotid artery disease: Last carotid  artery Dopplers were performed at [place] in 2017.  Left internal carotid artery was noted to be chronically occluded.  The right internal carotid artery with minimal disease. This is followed by her PCP. She is on ASA and statin therapy.   3.  Diastolic heart failure: Grade2 diastolic dysfunction noted on echocardiogram at [facility] in 2017.  EF normal. Volume appears stable. No dyspnea.   4.  Hypertension: Elevated in the 716 systolic.  Patient notes that this is her baseline.  She does have a history of 2 strokes in the past.  The last one being in 2017.  Also with known coronary and peripheral vascular disease.  She reports full medication compliance.  She is on the max dose of amlodipine and also the max dose of losartan.  Heart rates in the 70s.  We will further increase her metoprolol to 200 mg twice daily.  We will have her follow-up in the hypertension clinic in 2 to 3 weeks for repeat blood pressure assessment and also to recheck heart rate.  5.  Hyperlipidemia: on Pravastatin, followed by PCP.  I reviewed her last set of labs in care everywhere.  Her last lipid panel showed controlled LDL at 56 mg/dL.  6.  Diabetes: followed by PCP.   7.  History of CVA: first CVA was in 1996. She had a CVA in June 2017 and was given lytics at [facility]. Doing ok. On ASA and statin.  Increase metoprolol for better blood pressure control.   8. Depression: She notes that she is struggling with depression.  This is followed by her PCP.  She is now on Buspar. Started this 2 weeks ago. She lives at home with her daughter and boyfriend of 20 years.  No suicidal or homicidal ideation.   Follow-Up in hypretension clinic in 2 to 3 weeks and w/ Dr. Angelena Form in 12 months.   Terris Bodin Ladoris Gene, MHS [facility] 08/18/2017 2:30 PM

## 2017-08-18 NOTE — Patient Instructions (Signed)
Medication Instructions:  1. INCREASE LOPRESSOR TO 200 MG TWICE DAILY; NEW RX HAS BEEN SENT IN FOR THE NEW DIRECTIONS TO TAKE 2 TABLETS IN THE AM AND 2 TABLETS IN THE PM  Labwork: NONE ORDERED TODAY  Testing/Procedures: NONE ORDERED TODAY  Follow-Up: 1. Your physician wants you to follow-up in: [place] DR. Angelena Form You will receive a reminder letter in the mail two months in advance. If you don't receive a letter, please call our office to schedule the follow-up appointment.  2. YOU WILL NEED TO SEE THE HYPERTENSION CLINIC IN 2-3 WEEKS   Any Other Special Instructions Will Be Listed Below (If Applicable).     If you need a refill on your cardiac medications before your next appointment, please call your pharmacy.

## 2017-09-08 ENCOUNTER — Ambulatory Visit: Payer: Medicare Other

## 2017-11-11 ENCOUNTER — Other Ambulatory Visit: Payer: Self-pay | Admitting: Cardiovascular Disease

## 2017-11-11 MED ORDER — ZOLPIDEM TARTRATE 5 MG PO TABS: 5.0000 mg | ORAL_TABLET | Freq: Every evening | ORAL | 2 refills | Status: DC | PRN

## 2017-11-11 NOTE — Addendum Note (Signed)
Addended by: Gar Ponto on: 11/11/2017 02:13 PM   Modules accepted: Orders

## 2017-11-11 NOTE — Telephone Encounter (Signed)
Pt is prescribed AMBIEN 5 mg. Pt of [place], however because it is a controled substance it wont let me escribe. Please address. Thank you.

## 2017-11-14 NOTE — Telephone Encounter (Signed)
This should be filled by primary care

## 2017-12-28 ENCOUNTER — Emergency Department (HOSPITAL_COMMUNITY): Payer: Medicare Other

## 2017-12-28 ENCOUNTER — Inpatient Hospital Stay (HOSPITAL_COMMUNITY): Payer: Medicare Other

## 2017-12-28 ENCOUNTER — Encounter (HOSPITAL_COMMUNITY)
Admission: EM | Disposition: A | Discharge status: Rehabilitation Facility | Payer: Self-pay | Source: Home / Self Care | Attending: Neurology

## 2017-12-28 ENCOUNTER — Emergency Department (HOSPITAL_COMMUNITY): Payer: Medicare Other | Admitting: Registered Nurse

## 2017-12-28 ENCOUNTER — Inpatient Hospital Stay (HOSPITAL_COMMUNITY)
Admission: EM | Admit: 2017-12-28 | Discharge: 2018-01-03 | DRG: 064 | Disposition: A | Discharge status: Rehabilitation Facility | Payer: Medicare Other | Attending: Neurology | Admitting: Neurology

## 2017-12-28 ENCOUNTER — Encounter (HOSPITAL_COMMUNITY): Payer: Self-pay | Admitting: Emergency Medicine

## 2017-12-28 DIAGNOSIS — I2510 Atherosclerotic heart disease of native coronary artery without angina pectoris: Secondary | ICD-10-CM | POA: Diagnosis present

## 2017-12-28 DIAGNOSIS — I63511 Cerebral infarction due to unspecified occlusion or stenosis of right middle cerebral artery: Secondary | ICD-10-CM | POA: Diagnosis not present

## 2017-12-28 DIAGNOSIS — C4490 Unspecified malignant neoplasm of skin, unspecified: Secondary | ICD-10-CM | POA: Diagnosis present

## 2017-12-28 DIAGNOSIS — J449 Chronic obstructive pulmonary disease, unspecified: Secondary | ICD-10-CM | POA: Diagnosis not present

## 2017-12-28 DIAGNOSIS — E1142 Type 2 diabetes mellitus with diabetic polyneuropathy: Secondary | ICD-10-CM | POA: Diagnosis present

## 2017-12-28 DIAGNOSIS — M19032 Primary osteoarthritis, left wrist: Secondary | ICD-10-CM | POA: Diagnosis not present

## 2017-12-28 DIAGNOSIS — I10 Essential (primary) hypertension: Secondary | ICD-10-CM | POA: Diagnosis present

## 2017-12-28 DIAGNOSIS — R471 Dysarthria and anarthria: Secondary | ICD-10-CM | POA: Diagnosis present

## 2017-12-28 DIAGNOSIS — E1165 Type 2 diabetes mellitus with hyperglycemia: Secondary | ICD-10-CM | POA: Diagnosis not present

## 2017-12-28 DIAGNOSIS — E119 Type 2 diabetes mellitus without complications: Secondary | ICD-10-CM | POA: Diagnosis not present

## 2017-12-28 DIAGNOSIS — R1310 Dysphagia, unspecified: Secondary | ICD-10-CM | POA: Diagnosis present

## 2017-12-28 DIAGNOSIS — E538 Deficiency of other specified B group vitamins: Secondary | ICD-10-CM | POA: Diagnosis present

## 2017-12-28 DIAGNOSIS — Z91041 Radiographic dye allergy status: Secondary | ICD-10-CM | POA: Diagnosis not present

## 2017-12-28 DIAGNOSIS — I2782 Chronic pulmonary embolism: Secondary | ICD-10-CM

## 2017-12-28 DIAGNOSIS — M545 Low back pain: Secondary | ICD-10-CM | POA: Diagnosis present

## 2017-12-28 DIAGNOSIS — J9600 Acute respiratory failure, unspecified whether with hypoxia or hypercapnia: Secondary | ICD-10-CM | POA: Diagnosis present

## 2017-12-28 DIAGNOSIS — E1151 Type 2 diabetes mellitus with diabetic peripheral angiopathy without gangrene: Secondary | ICD-10-CM | POA: Diagnosis not present

## 2017-12-28 DIAGNOSIS — E782 Mixed hyperlipidemia: Secondary | ICD-10-CM | POA: Diagnosis not present

## 2017-12-28 DIAGNOSIS — Z885 Allergy status to narcotic agent status: Secondary | ICD-10-CM

## 2017-12-28 DIAGNOSIS — I252 Old myocardial infarction: Secondary | ICD-10-CM | POA: Diagnosis not present

## 2017-12-28 DIAGNOSIS — G460 Middle cerebral artery syndrome: Secondary | ICD-10-CM | POA: Diagnosis present

## 2017-12-28 DIAGNOSIS — I700 Atherosclerosis of aorta: Secondary | ICD-10-CM | POA: Diagnosis present

## 2017-12-28 DIAGNOSIS — I6523 Occlusion and stenosis of bilateral carotid arteries: Secondary | ICD-10-CM | POA: Diagnosis present

## 2017-12-28 DIAGNOSIS — Z79891 Long term (current) use of opiate analgesic: Secondary | ICD-10-CM

## 2017-12-28 DIAGNOSIS — Z8249 Family history of ischemic heart disease and other diseases of the circulatory system: Secondary | ICD-10-CM

## 2017-12-28 DIAGNOSIS — I482 Chronic atrial fibrillation, unspecified: Secondary | ICD-10-CM | POA: Diagnosis present

## 2017-12-28 DIAGNOSIS — IMO0002 Reserved for concepts with insufficient information to code with codable children: Secondary | ICD-10-CM

## 2017-12-28 DIAGNOSIS — H53462 Homonymous bilateral field defects, left side: Secondary | ICD-10-CM | POA: Diagnosis present

## 2017-12-28 DIAGNOSIS — I5030 Unspecified diastolic (congestive) heart failure: Secondary | ICD-10-CM | POA: Diagnosis not present

## 2017-12-28 DIAGNOSIS — Z23 Encounter for immunization: Secondary | ICD-10-CM | POA: Diagnosis present

## 2017-12-28 DIAGNOSIS — H538 Other visual disturbances: Secondary | ICD-10-CM | POA: Diagnosis present

## 2017-12-28 DIAGNOSIS — G8929 Other chronic pain: Secondary | ICD-10-CM | POA: Diagnosis present

## 2017-12-28 DIAGNOSIS — Z7901 Long term (current) use of anticoagulants: Secondary | ICD-10-CM

## 2017-12-28 DIAGNOSIS — I69354 Hemiplegia and hemiparesis following cerebral infarction affecting left non-dominant side: Secondary | ICD-10-CM

## 2017-12-28 DIAGNOSIS — M542 Cervicalgia: Secondary | ICD-10-CM | POA: Diagnosis present

## 2017-12-28 DIAGNOSIS — Z87891 Personal history of nicotine dependence: Secondary | ICD-10-CM

## 2017-12-28 DIAGNOSIS — R197 Diarrhea, unspecified: Secondary | ICD-10-CM | POA: Diagnosis present

## 2017-12-28 DIAGNOSIS — G8114 Spastic hemiplegia affecting left nondominant side: Secondary | ICD-10-CM | POA: Diagnosis not present

## 2017-12-28 DIAGNOSIS — R29709 NIHSS score 9: Secondary | ICD-10-CM | POA: Diagnosis present

## 2017-12-28 DIAGNOSIS — Z7982 Long term (current) use of aspirin: Secondary | ICD-10-CM | POA: Diagnosis not present

## 2017-12-28 DIAGNOSIS — I69398 Other sequelae of cerebral infarction: Secondary | ICD-10-CM | POA: Diagnosis not present

## 2017-12-28 DIAGNOSIS — R414 Neurologic neglect syndrome: Secondary | ICD-10-CM | POA: Diagnosis not present

## 2017-12-28 DIAGNOSIS — I63512 Cerebral infarction due to unspecified occlusion or stenosis of left middle cerebral artery: Secondary | ICD-10-CM | POA: Diagnosis not present

## 2017-12-28 DIAGNOSIS — E785 Hyperlipidemia, unspecified: Secondary | ICD-10-CM | POA: Diagnosis present

## 2017-12-28 DIAGNOSIS — G3184 Mild cognitive impairment, so stated: Secondary | ICD-10-CM | POA: Diagnosis present

## 2017-12-28 DIAGNOSIS — Z85828 Personal history of other malignant neoplasm of skin: Secondary | ICD-10-CM

## 2017-12-28 DIAGNOSIS — Z808 Family history of malignant neoplasm of other organs or systems: Secondary | ICD-10-CM

## 2017-12-28 DIAGNOSIS — Z7984 Long term (current) use of oral hypoglycemic drugs: Secondary | ICD-10-CM

## 2017-12-28 DIAGNOSIS — I6522 Occlusion and stenosis of left carotid artery: Secondary | ICD-10-CM

## 2017-12-28 DIAGNOSIS — Z888 Allergy status to other drugs, medicaments and biological substances status: Secondary | ICD-10-CM

## 2017-12-28 DIAGNOSIS — R52 Pain, unspecified: Secondary | ICD-10-CM | POA: Diagnosis not present

## 2017-12-28 DIAGNOSIS — I63411 Cerebral infarction due to embolism of right middle cerebral artery: Secondary | ICD-10-CM | POA: Diagnosis present

## 2017-12-28 DIAGNOSIS — I639 Cerebral infarction, unspecified: Secondary | ICD-10-CM | POA: Diagnosis present

## 2017-12-28 DIAGNOSIS — Z79899 Other long term (current) drug therapy: Secondary | ICD-10-CM

## 2017-12-28 DIAGNOSIS — R29810 Facial weakness: Secondary | ICD-10-CM | POA: Diagnosis present

## 2017-12-28 DIAGNOSIS — Z86711 Personal history of pulmonary embolism: Secondary | ICD-10-CM | POA: Diagnosis not present

## 2017-12-28 DIAGNOSIS — E1159 Type 2 diabetes mellitus with other circulatory complications: Secondary | ICD-10-CM | POA: Diagnosis not present

## 2017-12-28 DIAGNOSIS — I6521 Occlusion and stenosis of right carotid artery: Secondary | ICD-10-CM

## 2017-12-28 DIAGNOSIS — M549 Dorsalgia, unspecified: Secondary | ICD-10-CM | POA: Diagnosis present

## 2017-12-28 HISTORY — PX: IR ANGIO VERTEBRAL SEL VERTEBRAL UNI L MOD SED: IMG5367

## 2017-12-28 HISTORY — PX: IR ANGIO INTRA EXTRACRAN SEL COM CAROTID INNOMINATE BILAT MOD SED: IMG5360

## 2017-12-28 HISTORY — PX: RADIOLOGY WITH ANESTHESIA: SHX6223

## 2017-12-28 HISTORY — PX: IR ANGIO VERTEBRAL SEL SUBCLAVIAN INNOMINATE UNI R MOD SED: IMG5365

## 2017-12-28 LAB — PROTIME-INR
INR: 1.25
Prothrombin Time: 15.6 seconds — ABNORMAL HIGH (ref 11.4–15.2)

## 2017-12-28 LAB — COMPREHENSIVE METABOLIC PANEL
ALBUMIN: 3.9 g/dL (ref 3.5–5.0)
ALK PHOS: 52 U/L (ref 38–126)
ALT: 17 U/L (ref 0–44)
AST: 34 U/L (ref 15–41)
Anion gap: 9 (ref 5–15)
BUN: 13 mg/dL (ref 8–23)
CALCIUM: 9.3 mg/dL (ref 8.9–10.3)
CHLORIDE: 103 mmol/L (ref 98–111)
CO2: 27 mmol/L (ref 22–32)
CREATININE: 0.78 mg/dL (ref 0.44–1.00)
GFR calc Af Amer: >60 mL/min (ref >60)
GFR calc non Af Amer: >60 mL/min (ref >60)
GLUCOSE: 131 mg/dL — AB (ref 70–99)
Potassium: 4.4 mmol/L (ref 3.5–5.1)
SODIUM: 139 mmol/L (ref 135–145)
Total Bilirubin: 1.4 mg/dL — ABNORMAL HIGH (ref 0.3–1.2)
Total Protein: 6.6 g/dL (ref 6.5–8.1)

## 2017-12-28 LAB — ETHANOL

## 2017-12-28 LAB — CBG MONITORING, ED
GLUCOSE-CAPILLARY: 129 mg/dL — AB (ref 70–99)
GLUCOSE-CAPILLARY: 184 mg/dL — AB (ref 70–99)
Glucose-Capillary: 122 mg/dL — ABNORMAL HIGH (ref 70–99)

## 2017-12-28 LAB — APTT: aPTT: 38 seconds — ABNORMAL HIGH (ref 24–36)

## 2017-12-28 LAB — I-STAT TROPONIN, ED: Troponin i, poc: 0 ng/mL (ref 0.00–0.08)

## 2017-12-28 LAB — CBC
HEMATOCRIT: 41.5 % (ref 36.0–46.0)
HEMOGLOBIN: 13.8 g/dL (ref 12.0–15.0)
MCH: 29.2 pg (ref 26.0–34.0)
MCHC: 33.3 g/dL (ref 30.0–36.0)
MCV: 87.9 fL (ref 80.0–100.0)
NRBC: 0 % (ref 0.0–0.2)
Platelets: 179 10*3/uL (ref 150–400)
RBC: 4.72 MIL/uL (ref 3.87–5.11)
RDW: 13.8 % (ref 11.5–15.5)
WBC: 8.4 10*3/uL (ref 4.0–10.5)

## 2017-12-28 LAB — ECHOCARDIOGRAM COMPLETE

## 2017-12-28 LAB — DIFFERENTIAL
ABS IMMATURE GRANULOCYTES: 0.02 10*3/uL (ref 0.00–0.07)
BASOS PCT: 1 %
Basophils Absolute: 0 10*3/uL (ref 0.0–0.1)
Eosinophils Absolute: 0.3 10*3/uL (ref 0.0–0.5)
Eosinophils Relative: 4 %
IMMATURE GRANULOCYTES: 0 %
LYMPHS PCT: 13 %
Lymphs Abs: 1.1 10*3/uL (ref 0.7–4.0)
Monocytes Absolute: 0.7 10*3/uL (ref 0.1–1.0)
Monocytes Relative: 9 %
NEUTROS ABS: 6.2 10*3/uL (ref 1.7–7.7)
Neutrophils Relative %: 73 %

## 2017-12-28 LAB — I-STAT CHEM 8, ED
BUN: 14 mg/dL (ref 8–23)
CALCIUM ION: 1.15 mmol/L (ref 1.15–1.40)
CHLORIDE: 103 mmol/L (ref 98–111)
CREATININE: 0.8 mg/dL (ref 0.44–1.00)
GLUCOSE: 138 mg/dL — AB (ref 70–99)
HCT: 40 % (ref 36.0–46.0)
Hemoglobin: 13.6 g/dL (ref 12.0–15.0)
Potassium: 3.9 mmol/L (ref 3.5–5.1)
Sodium: 141 mmol/L (ref 135–145)
TCO2: 28 mmol/L (ref 22–32)

## 2017-12-28 LAB — MRSA PCR SCREENING: MRSA BY PCR: NEGATIVE

## 2017-12-28 LAB — GLUCOSE, CAPILLARY
Glucose-Capillary: 209 mg/dL — ABNORMAL HIGH (ref 70–99)
Glucose-Capillary: 145 mg/dL — ABNORMAL HIGH (ref 70–99)

## 2017-12-28 SURGERY — IR WITH ANESTHESIA
Anesthesia: General

## 2017-12-28 MED ORDER — ACETAMINOPHEN 650 MG RE SUPP
650.0000 mg | RECTAL | Status: DC | PRN
  Administered 2017-12-28: 650 mg via RECTAL
  Filled 2017-12-28: qty 1

## 2017-12-28 MED ORDER — DIPHENHYDRAMINE HCL 50 MG/ML IJ SOLN
INTRAMUSCULAR | Status: AC
  Filled 2017-12-28: qty 1

## 2017-12-28 MED ORDER — CLEVIDIPINE BUTYRATE 0.5 MG/ML IV EMUL
INTRAVENOUS | Status: DC | PRN
  Administered 2017-12-28: 1 mg/h via INTRAVENOUS

## 2017-12-28 MED ORDER — METHYLPREDNISOLONE SODIUM SUCC 125 MG IJ SOLR
INTRAMUSCULAR | Status: DC | PRN
  Administered 2017-12-28: 125 mg via INTRAVENOUS

## 2017-12-28 MED ORDER — HEPARIN SODIUM (PORCINE) 5000 UNIT/ML IJ SOLN
5000.0000 [IU] | Freq: Three times a day (TID) | INTRAMUSCULAR | Status: DC
  Administered 2017-12-28 – 2017-12-29 (×3): 5000 [IU] via SUBCUTANEOUS
  Filled 2017-12-28 (×3): qty 1

## 2017-12-28 MED ORDER — IOHEXOL 300 MG/ML  SOLN
150.0000 mL | Freq: Once | INTRAMUSCULAR | Status: AC | PRN
  Administered 2017-12-28: 80 mL via INTRA_ARTERIAL

## 2017-12-28 MED ORDER — INSULIN ASPART 100 UNIT/ML ~~LOC~~ SOLN
0.0000 [IU] | Freq: Three times a day (TID) | SUBCUTANEOUS | Status: DC
  Administered 2017-12-28: 3 [IU] via SUBCUTANEOUS
  Administered 2017-12-29: 2 [IU] via SUBCUTANEOUS
  Administered 2017-12-29: 1 [IU] via SUBCUTANEOUS
  Administered 2017-12-30 (×3): 2 [IU] via SUBCUTANEOUS
  Administered 2017-12-31 – 2018-01-01 (×4): 1 [IU] via SUBCUTANEOUS
  Administered 2018-01-02: 2 [IU] via SUBCUTANEOUS
  Administered 2018-01-02 – 2018-01-03 (×3): 1 [IU] via SUBCUTANEOUS

## 2017-12-28 MED ORDER — ONDANSETRON HCL 4 MG/2ML IJ SOLN
INTRAMUSCULAR | Status: DC | PRN
  Administered 2017-12-28: 4 mg via INTRAVENOUS

## 2017-12-28 MED ORDER — NITROGLYCERIN 1 MG/10 ML FOR IR/CATH LAB
INTRA_ARTERIAL | Status: AC
  Filled 2017-12-28: qty 10

## 2017-12-28 MED ORDER — ORAL CARE MOUTH RINSE
15.0000 mL | Freq: Two times a day (BID) | OROMUCOSAL | Status: DC
  Administered 2017-12-28 – 2018-01-03 (×9): 15 mL via OROMUCOSAL

## 2017-12-28 MED ORDER — LIDOCAINE HCL (PF) 1 % IJ SOLN
INTRAMUSCULAR | Status: AC
  Filled 2017-12-28: qty 30

## 2017-12-28 MED ORDER — ROCURONIUM BROMIDE 50 MG/5ML IV SOSY
PREFILLED_SYRINGE | INTRAVENOUS | Status: DC | PRN
  Administered 2017-12-28: 50 mg via INTRAVENOUS
  Administered 2017-12-28: 20 mg via INTRAVENOUS

## 2017-12-28 MED ORDER — GABAPENTIN 400 MG PO CAPS
400.0000 mg | ORAL_CAPSULE | Freq: Every day | ORAL | Status: DC
  Administered 2017-12-29 – 2018-01-03 (×6): 400 mg via ORAL
  Filled 2017-12-28 (×6): qty 1

## 2017-12-28 MED ORDER — EPTIFIBATIDE 20 MG/10ML IV SOLN
INTRAVENOUS | Status: AC
  Filled 2017-12-28: qty 10

## 2017-12-28 MED ORDER — ACETAMINOPHEN 160 MG/5ML PO SOLN: 650.0000 mg | ORAL | Status: DC | PRN

## 2017-12-28 MED ORDER — SODIUM CHLORIDE 0.9 % IV SOLN
INTRAVENOUS | Status: DC
  Administered 2017-12-30: 04:00:00 via INTRAVENOUS

## 2017-12-28 MED ORDER — SUGAMMADEX SODIUM 200 MG/2ML IV SOLN
INTRAVENOUS | Status: DC | PRN
  Administered 2017-12-28 (×2): 100 mg via INTRAVENOUS

## 2017-12-28 MED ORDER — LACTATED RINGERS IV SOLN
INTRAVENOUS | Status: DC | PRN
  Administered 2017-12-28 (×2): via INTRAVENOUS

## 2017-12-28 MED ORDER — FENTANYL CITRATE (PF) 100 MCG/2ML IJ SOLN
INTRAMUSCULAR | Status: DC | PRN
  Administered 2017-12-28 (×2): 50 ug via INTRAVENOUS

## 2017-12-28 MED ORDER — LABETALOL HCL 5 MG/ML IV SOLN
INTRAVENOUS | Status: DC | PRN
  Administered 2017-12-28 (×2): 10 mg via INTRAVENOUS

## 2017-12-28 MED ORDER — ASPIRIN 325 MG PO TABS
ORAL_TABLET | ORAL | Status: AC
  Filled 2017-12-28: qty 1

## 2017-12-28 MED ORDER — DIPHENHYDRAMINE HCL 50 MG/ML IJ SOLN
INTRAMUSCULAR | Status: DC | PRN
  Administered 2017-12-28: 50 mg via INTRAVENOUS

## 2017-12-28 MED ORDER — CEFAZOLIN SODIUM-DEXTROSE 2-4 GM/100ML-% IV SOLN
INTRAVENOUS | Status: AC
  Filled 2017-12-28: qty 100

## 2017-12-28 MED ORDER — TIROFIBAN HCL IN NACL 5-0.9 MG/100ML-% IV SOLN
INTRAVENOUS | Status: AC
  Filled 2017-12-28: qty 100

## 2017-12-28 MED ORDER — SODIUM CHLORIDE 0.9 % IV SOLN
INTRAVENOUS | Status: DC
  Administered 2017-12-28: 14:00:00 via INTRAVENOUS

## 2017-12-28 MED ORDER — METHYLPREDNISOLONE SODIUM SUCC 125 MG IJ SOLR
INTRAMUSCULAR | Status: AC
  Filled 2017-12-28: qty 2

## 2017-12-28 MED ORDER — SODIUM CHLORIDE 0.9 % IV SOLN
INTRAVENOUS | Status: DC | PRN
  Administered 2017-12-28: 30 ug/min via INTRAVENOUS

## 2017-12-28 MED ORDER — IOPAMIDOL (ISOVUE-300) INJECTION 61%
INTRAVENOUS | Status: AC
  Administered 2017-12-28: 10 mL via INTRA_ARTERIAL
  Filled 2017-12-28: qty 50

## 2017-12-28 MED ORDER — TICAGRELOR 90 MG PO TABS
ORAL_TABLET | ORAL | Status: AC
  Filled 2017-12-28: qty 2

## 2017-12-28 MED ORDER — LIDOCAINE HCL 1 % IJ SOLN
INTRAMUSCULAR | Status: AC
  Filled 2017-12-28: qty 20

## 2017-12-28 MED ORDER — DEXAMETHASONE SODIUM PHOSPHATE 10 MG/ML IJ SOLN
INTRAMUSCULAR | Status: DC | PRN
  Administered 2017-12-28: 10 mg via INTRAVENOUS

## 2017-12-28 MED ORDER — ACETAMINOPHEN 325 MG PO TABS
650.0000 mg | ORAL_TABLET | ORAL | Status: DC | PRN
  Administered 2017-12-31 – 2018-01-02 (×2): 650 mg via ORAL
  Filled 2017-12-28 (×3): qty 2

## 2017-12-28 MED ORDER — CLOPIDOGREL BISULFATE 300 MG PO TABS
ORAL_TABLET | ORAL | Status: AC
  Filled 2017-12-28: qty 1

## 2017-12-28 MED ORDER — CEFAZOLIN SODIUM-DEXTROSE 2-3 GM-%(50ML) IV SOLR
INTRAVENOUS | Status: DC | PRN
  Administered 2017-12-28: 2 g via INTRAVENOUS

## 2017-12-28 MED ORDER — CLEVIDIPINE BUTYRATE 0.5 MG/ML IV EMUL
0.0000 mg/h | INTRAVENOUS | Status: DC
  Administered 2017-12-28: 2 mg/h via INTRAVENOUS
  Administered 2017-12-29: 8 mg/h via INTRAVENOUS
  Administered 2017-12-29: 12 mg/h via INTRAVENOUS
  Administered 2017-12-29 (×2): 6 mg/h via INTRAVENOUS
  Filled 2017-12-28 (×6): qty 50

## 2017-12-28 MED ORDER — STROKE: EARLY STAGES OF RECOVERY BOOK
Freq: Once | Status: DC
  Filled 2017-12-28: qty 1

## 2017-12-28 MED ORDER — SENNOSIDES-DOCUSATE SODIUM 8.6-50 MG PO TABS: 1.0000 | ORAL_TABLET | Freq: Every evening | ORAL | Status: DC | PRN

## 2017-12-28 MED ORDER — LIDOCAINE 2% (20 MG/ML) 5 ML SYRINGE
INTRAMUSCULAR | Status: DC | PRN
  Administered 2017-12-28: 100 mg via INTRAVENOUS

## 2017-12-28 MED ORDER — PROPOFOL 10 MG/ML IV BOLUS
INTRAVENOUS | Status: DC | PRN
  Administered 2017-12-28: 100 mg via INTRAVENOUS

## 2017-12-28 NOTE — Anesthesia Procedure Notes (Signed)
Procedure Name: Intubation Date/Time: 12/28/2017 9:14 AM Performed by: Trinna Post., CRNA Pre-anesthesia Checklist: Patient identified, Suction available, Emergency Drugs available, Patient being monitored and Timeout performed Patient Re-evaluated:Patient Re-evaluated prior to induction Oxygen Delivery Method: Circle system utilized Preoxygenation: Pre-oxygenation with 100% oxygen Induction Type: IV induction, Cricoid Pressure applied and Rapid sequence Laryngoscope Size: Mac and 3 Grade View: Grade I Tube type: Oral Tube size: 7.0 mm Number of attempts: 1 Airway Equipment and Method: Stylet Placement Confirmation: ETT inserted through vocal cords under direct vision,  positive ETCO2 and breath sounds checked- equal and bilateral Secured at: 21 cm Tube secured with: Tape Dental Injury: Teeth and Oropharynx as per pre-operative assessment

## 2017-12-28 NOTE — Anesthesia Preprocedure Evaluation (Signed)
Anesthesia Evaluation  Patient identified by MRN, date of birth, ID band Patient awake    Reviewed: Allergy & Precautions, H&P , NPO status , Patient's Chart, lab work & pertinent test results, reviewed documented beta blocker date and time   Airway Mallampati: II  TM Distance: >3 FB Neck ROM: full    Dental no notable dental hx.    Pulmonary neg pulmonary ROS, former smoker,    Pulmonary exam normal breath sounds clear to auscultation       Cardiovascular Exercise Tolerance: Good hypertension, Pt. on medications and Pt. on home beta blockers + CAD and + Peripheral Vascular Disease   Rhythm:regular Rate:Normal     Neuro/Psych negative neurological ROS  negative psych ROS   GI/Hepatic negative GI ROS, Neg liver ROS,   Endo/Other  diabetes, Type 2, Oral Hypoglycemic Agents  Renal/GU negative Renal ROS  negative genitourinary   Musculoskeletal   Abdominal   Peds  Hematology negative hematology ROS (+)   Anesthesia Other Findings   Reproductive/Obstetrics negative OB ROS                             Anesthesia Physical Anesthesia Plan  ASA: IV and emergent  Anesthesia Plan: General   Post-op Pain Management:    Induction: Intravenous and Cricoid pressure planned  PONV Risk Score and Plan: 3 and Treatment may vary due to age or medical condition and Ondansetron  Airway Management Planned: Oral ETT  Additional Equipment: Arterial line  Intra-op Plan:   Post-operative Plan: Extubation in OR  Informed Consent: I have reviewed the patients History and Physical, chart, labs and discussed the procedure including the risks, benefits and alternatives for the proposed anesthesia with the patient or authorized representative who has indicated his/her understanding and acceptance.   Dental Advisory Given  Plan Discussed with: CRNA, Anesthesiologist and Surgeon  Anesthesia Plan Comments:  (  )        Anesthesia Quick Evaluation

## 2017-12-28 NOTE — Progress Notes (Signed)
RN paged Neuro MD regarding systolic BP parameters of 120-140.  Cleviprex turned off and patient systolic consistantly in 331G.  MD aware and fine with this as long as neuro exam remains unchanged.

## 2017-12-28 NOTE — Anesthesia Postprocedure Evaluation (Signed)
Anesthesia Post Note  Patient: Deryl Ports  Procedure(s) Performed: IR WITH ANESTHESIA (N/A )     Patient location during evaluation: PACU Anesthesia Type: General Level of consciousness: awake and alert Pain management: pain level controlled Vital Signs Assessment: post-procedure vital signs reviewed and stable Respiratory status: spontaneous breathing, nonlabored ventilation, respiratory function stable and patient connected to nasal cannula oxygen Cardiovascular status: blood pressure returned to baseline and stable Postop Assessment: no apparent nausea or vomiting Anesthetic complications: no    Last Vitals:  Vitals:   12/28/17 0850 12/28/17 1051  BP: (!) 156/89   Pulse: 69 78  Resp:  16  Temp:  36.4 C  SpO2:  90%    Last Pain:  Vitals:   12/28/17 1051  PainSc: 0-No pain                 Yarelie Hams

## 2017-12-28 NOTE — Code Documentation (Signed)
77 yo female coming from home via [facility]. Pt was LKW at [place] when she was sitting on the toilet. Pt had sudden onset of left sided weakness and visual changes. Called family and family called EMS. EMS brought patient in. Pt activated as Code Stroke upon arrival to the ED. Stroke Team met patient in CT. CT negative for hemorrhage. Initial NIHSS 8 due to left visual cut, right gaze preference, left arm and leg drift, slight left facial palsy, dysarthria, and neglect. Pt has sever contrast allergy. Unable to completed CTA. Took patient to MRI and decided to take to IR before MRI was started. Pt taken to IR. Arrived at 76. Report given to [facility], Therapist, sports. Pt alert and oriented x4. Verbal consent given with witness of MD Peterson Ao, RT-R, and this RN. Pt to be admitted to [facility].

## 2017-12-28 NOTE — Sedation Documentation (Signed)
Patient under the care of anesthesia 

## 2017-12-28 NOTE — ED Provider Notes (Signed)
[facility] EMERGENCY DEPARTMENT Provider Note   CSN: [id number] Arrival date & time: 12/28/17  4782     History   Chief Complaint Chief Complaint  Patient presents with  . Code Stroke    HPI Shannel Zahm is a 77 y.o. female.  HPI Last known well time is 48 30.  Patient reports that she was going to the bathroom when she suddenly could not move her left side.  She reports that she does take Eliquis.  She reports she has had prior stroke.  She reports it was probably several months ago but she is not sure of the time.  He denies that she has been ill leading up to this.  She reports had been feeling well and doing normal activities.  She reports "slight headache" at time of onset of symptoms. Past Medical History:  Diagnosis Date  . CAD (coronary artery disease)   . Diabetes mellitus    type 2  . Hyperlipidemia   . Hypertension   . Past heart attack 1998    Patient Active Problem List   Diagnosis Date Noted  . Acute ischemic stroke ([place]) 12/28/2017  . B12 deficiency 10/12/2016  . Procedure refused 10/12/2016  . Dysthymia 08/31/2016  . Controlled substance agreement signed 05/03/2016  . URI (upper respiratory infection) 05/03/2016  . Chronic right shoulder pain 02/02/2016  . Abscess of face 09/11/2015  . Nausea 09/11/2015  . Ischemic stroke ([place]) 08/30/2015  . Acute maxillary sinusitis 04/02/2015  . Chronic obstructive pulmonary disease ([place]) 03/04/2015  . Fatigue 02/20/2015  . Hypoxia 02/20/2015  . Left shoulder pain 10/21/2014  . Breast cancer screening, high risk patient 06/20/2014  . Polyneuropathy associated with underlying disease ([place]) 06/20/2014  . Back pain 11/15/2013  . Myalgia 11/15/2013  . Proteinuria 11/15/2013  . Anxiety 07/02/2013  . Colonoscopy refused 05/21/2013  . Diarrhea 05/21/2013  . Weight loss 05/21/2013  . 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 Occlusion and stenosis of carotid artery 07/04/2012  .  Peripheral vascular disease ([place]) 07/04/2012  . Scoliosis (and kyphoscoliosis), idiopathic 07/04/2012  . Spondylolisthesis, congenital 07/04/2012  . Hyperlipidemia 07/04/2012  . Type II diabetes mellitus ([place]) 07/04/2012  . DM 03/13/2010  . HYPERLIPIDEMIA-MIXED 03/13/2010  . Essential hypertension 03/13/2010  . CAD (coronary artery disease), native coronary artery 03/13/2010    Past Surgical History:  Procedure Laterality Date  . APPENDECTOMY    . BACK SURGERY     x 2  . CESAREAN SECTION     x 3  . NECK SURGERY    . Skin cancer removal face    . TONSILLECTOMY       OB History   None      Home Medications    Prior to Admission medications   Medication Sig Start Date End Date Taking? Authorizing Provider  amLODipine (NORVASC) 10 MG tablet Take 10 mg by mouth daily.      [provider]  aspirin 81 MG tablet Take 81 mg by mouth daily.      [provider]  busPIRone (BUSPAR) 15 MG tablet Take 15 mg by mouth 2 (two) times daily. 08/12/17 08/12/18  [provider]  gabapentin (NEURONTIN) 400 MG capsule Take 400 mg by mouth daily. 02/20/15   [provider]  glipiZIDE (GLUCOTROL) 5 MG tablet Take 5 mg by mouth daily before breakfast.     [provider]  losartan (COZAAR) 100 MG tablet Take 100 mg by mouth Daily.  11/24/11  [provider]  metFORMIN (GLUCOPHAGE) 1000 MG tablet Take 500 mg by mouth daily with breakfast.     [provider]  metoprolol tartrate (LOPRESSOR) 100 MG tablet Take 2 tablets (200 mg total) by mouth 2 (two) times daily. 08/18/17   Lyda Jester M, PA-C  Omega-3 Fatty Acids (FISH OIL) 1000 MG CAPS Take 2 capsules by mouth daily.     [provider]  oxyCODONE-acetaminophen (PERCOCET) 7.5-325 MG per tablet Take 1 tablet by mouth every 6 (six) hours as needed for severe pain.  10/25/11   [provider]  pravastatin (PRAVACHOL) 40 MG tablet Take 40 mg by mouth daily.       [provider]  zolpidem (AMBIEN) 5 MG tablet Take 1 tablet (5 mg total) by mouth at bedtime as needed. for sleep 11/11/17   Burnell Blanks, MD    Family History Family History  Problem Relation Age of Onset  . Bone cancer Mother 81       died  . Heart failure Father 33       died    Social History Social History   Tobacco Use  . Smoking status: Former Smoker    Packs/day: 2.00    Years: 40.00    Pack years: 80.00    Types: Cigarettes    Last attempt to quit: 03/22/1996    Years since quitting: 21.7  . Smokeless tobacco: Never Used  Substance Use Topics  . Alcohol use: No  . Drug use: No     Allergies   Codeine; Contrast media [iodinated diagnostic agents]; Metrizamide; and Morphine   Review of Systems Review of Systems 10 Systems reviewed and are negative for acute change except as noted in the HPI.   Physical Exam Updated Vital Signs BP (!) 156/89 (BP Location: Right Arm)   Pulse 69   Physical Exam  Constitutional:  Patient arrives on EMS stretcher in semi-recumbent position.  She is alert in appearance.  She is not showing active distress.  Respirations are, nonlabored.  HENT:  Head: Normocephalic and atraumatic.  Mouth/Throat: Oropharynx is clear and moist.  No pooling secretions in airway.  No sonorous breathing.  Eyes:  Patient has right gaze deviation and cannot follow my finger past midline.  Neck: Neck supple.  Cardiovascular: Normal rate, regular rhythm, normal heart sounds and intact distal pulses.  Pulmonary/Chest: Effort normal and breath sounds normal.  Abdominal: Soft. She exhibits no distension. There is no tenderness. There is no guarding.  Musculoskeletal: She exhibits no edema, tenderness or deformity.  Neurological:  Patient is alert.  Her speech is situationally appropriate without apparent receptive or expressive a aphasia.  Speech quality easily intelligible.  Patient has right gaze deviation with inability to follow  my finger past midline.  Significant weakness of left upper and lower remedies.  Patient can make effort at grip on the left is approximately 2\5 normal grip on the right.  Patient makes an effort to elevate left lower extremity off of bed and move the toes but significant weakness as compared to right.  Skin: Skin is warm and dry.  Psychiatric: She has a normal mood and affect.     ED Treatments / Results  Labs (all labs ordered are listed, but only abnormal results are displayed) Labs Reviewed  I-STAT CHEM 8, ED - Abnormal; Notable for the following components:      Result Value   Glucose, Bld 138 (*)    All other components within normal limits  CBG MONITORING, ED - Abnormal; Notable for the following components:   Glucose-Capillary 129 (*)    All other components within normal limits  CBG MONITORING, ED - Abnormal; Notable for the following components:   Glucose-Capillary 122 (*)    All other components within normal limits  CBC  DIFFERENTIAL  ETHANOL  PROTIME-INR  APTT  COMPREHENSIVE METABOLIC PANEL  RAPID URINE DRUG SCREEN, HOSP PERFORMED  URINALYSIS, ROUTINE W REFLEX MICROSCOPIC  I-STAT TROPONIN, ED    EKG EKG is not done or in the computer at time of completion of patient care.  Radiology Ct Head Code Stroke Wo Contrast  Result Date: 12/28/2017 CLINICAL DATA:  Code stroke. Acute onset of left-sided weakness and right-sided gaze. Focal neuro deficit of less than 6 hours. EXAM: CT HEAD WITHOUT CONTRAST TECHNIQUE: Contiguous axial images were obtained from the base of the skull through the vertex without intravenous contrast. COMPARISON:  CT head without contrast 10/21/2009. FINDINGS: Brain: Mild atrophy and moderate white matter disease has progressed since the prior exam. No acute anterior circulation infarct is present. Asymmetric ischemic changes of the right basal ganglia are stable. A new right occipital pole infarct is present. This is age indeterminate. No acute  hemorrhage or mass lesion is present. The brainstem and cerebellum are normal. Ventricles are proportionate to the degree of atrophy. No significant extra-axial fluid collection is present. Vascular: Atherosclerotic calcifications are present in the cavernous internal carotid arteries. No significant asymmetric hyperdensity is evident. Skull: Calvarium is intact. No focal lytic or blastic lesions are present. No significant extracranial soft tissue lesion is present. Sinuses/Orbits: The paranasal sinuses and mastoid air cells are clear. ASPECTS [facility] Stroke Program Early CT Score) - Ganglionic level infarction (caudate, lentiform nuclei, internal capsule, insula, M1-M3 cortex): 7/7 - Supraganglionic infarction (M4-M6 cortex): 3/3 Total score (0-10 with 10 being normal): 10/10 IMPRESSION: 1. Right occipital lobe infarct is new since the prior exam. This is age indeterminate. Acute infarct is not excluded. 2. No acute anterior circulation infarct. 3. Stable remote ischemic changes of the basal ganglia, right greater than left. 4. Progressive atrophy and white matter disease. This likely reflects the sequela of chronic microvascular ischemia. 5. Atherosclerotic changes without a hyperdense vessel. 6. ASPECTS is 10/10 These results were called by telephone at the time of interpretation on 12/28/2017 at 8:40am to Dr. Rory Percy, who verbally acknowledged these results. Electronically Signed   By: San Morelle M.D.   On: 12/28/2017 08:49    Procedures Procedures (including critical care time) CRITICAL CARE Performed by: Charlesetta Shanks   Total critical care time: 30 minutes  Critical care time was exclusive of separately billable procedures and treating other patients.  Critical care was necessary to treat or prevent imminent or life-threatening deterioration.  Critical care was time spent personally by me on the following activities: development of treatment plan with patient and/or surrogate as well  as nursing, discussions with consultants, evaluation of patient's response to treatment, examination of patient, obtaining history from patient or surrogate, ordering and performing treatments and interventions, ordering and review of laboratory studies, ordering and review of radiographic studies, pulse oximetry and re-evaluation of patient's condition. Medications Ordered in ED Medications  tirofiban (AGGRASTAT) 5-0.9 MG/100ML-% injection (has no administration in time range)  ticagrelor (BRILINTA) 90 MG tablet (has no administration in time range)  aspirin 325 MG tablet (has no administration in time range)  clopidogrel (PLAVIX) 300 MG tablet (has no administration in time range)  lidocaine (PF) (XYLOCAINE) 1 % injection (has  no administration in time range)  lidocaine (XYLOCAINE) 1 % (with pres) injection (has no administration in time range)  nitroGLYCERIN 100 mcg/mL intra-arterial injection (has no administration in time range)  eptifibatide (INTEGRILIN) 20 MG/10ML injection (has no administration in time range)  diphenhydrAMINE (BENADRYL) 50 MG/ML injection (has no administration in time range)  methylPREDNISolone sodium succinate (SOLU-MEDROL) 125 mg/2 mL injection (has no administration in time range)   stroke: mapping our early stages of recovery book (has no administration in time range)  0.9 %  sodium chloride infusion (has no administration in time range)  acetaminophen (TYLENOL) tablet 650 mg (has no administration in time range)    Or  acetaminophen (TYLENOL) solution 650 mg (has no administration in time range)    Or  acetaminophen (TYLENOL) suppository 650 mg (has no administration in time range)  senna-docusate (Senokot-S) tablet 1 tablet (has no administration in time range)     Initial Impression / Assessment and Plan / ED Course  I have reviewed the triage vital signs and the nursing notes.  Pertinent labs & imaging results that were available during my care of the  patient were reviewed by me and considered in my medical decision making (see chart for details).     Consult: Dr. Lorraine Lax has called to update on patient findings and the plan.  Has MCA stroke with plan to attempt interventional radiology and admission.  Final Clinical Impressions(s) / ED Diagnoses   Final diagnoses:  Acute ischemic stroke [facility])  Patient presents with time of onset of acute stroke symptoms.  Airway is intact.  Her mental status is alert.  Review of systems is negative.  No immediate indication of other active problems.  Neurology has done assessment and determined patient will proceed with attempt at interventional procedure and admission.  ED Discharge Orders    None       Charlesetta Shanks, MD 12/28/17 [phone]

## 2017-12-28 NOTE — ED Notes (Signed)
Pt  transported to ct scan then straight to IR

## 2017-12-28 NOTE — Procedures (Signed)
S/P 4 vessel cerebral arteriogram RT CFA appeoach.Findings. 1.No occlusions,stenosis,or filling defects or dissections involving RT anterior circulation. 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 3.Occluded  Lt ICA prox, with modest reconstitution of Lt ICA cavernous seg and Lt MCA and ACA from Lt ECA branches via  ophthalmic  Artery.

## 2017-12-28 NOTE — Transfer of Care (Signed)
Immediate Anesthesia Transfer of Care Note  Patient: Catherine Greene  Procedure(s) Performed: IR WITH ANESTHESIA (N/A )  Patient Location: PACU  Anesthesia Type:General  Level of Consciousness: awake, alert  and oriented  Airway & Oxygen Therapy: Patient Spontanous Breathing and Patient connected to nasal cannula oxygen  Post-op Assessment: Report given to RN and Post -op Vital signs reviewed and stable  Post vital signs: Reviewed and stable  Last Vitals:  Vitals Value Taken Time  BP 119/55 12/28/2017 10:52 AM  Temp    Pulse 77 12/28/2017 10:54 AM  Resp 16 12/28/2017 10:54 AM  SpO2 92 % 12/28/2017 10:54 AM  Vitals shown include unvalidated device data.  Last Pain:  Vitals:   12/28/17 0917  PainSc: 0-No pain         Complications: No apparent anesthesia complications

## 2017-12-28 NOTE — Progress Notes (Signed)
  Echocardiogram 2D Echocardiogram has been performed.  Catherine Greene 12/28/2017, 3:01 PM

## 2017-12-28 NOTE — H&P (Addendum)
Stroke H&P CC: Right-sided weakness History is obtained from: Patient, later family  HPI: Catherine Greene is a 77 y.o. 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  EMS was called normal CBC and vitals per EMS.  On the way to the hospital, she started having right gaze preference, and worsening left-sided weakness. She was evaluated in the CT scanner and had exam findings as detailed below consistent with right MCA syndrome. She is allergic to IV contrast hence a CTA was not performed.  She listed her allergy as passing out and having needed to be revived last time she got the contrast. Because of her severe allergy and the time required for prep, decision was made to take her straight for the thrombectomy because of the exam as well as noncontrast head CT-that showed no evidence of bleed, aspects 10 and possible evolving right cerebral hemispheric early hypodensity concerning for a right cerebral stroke. Family arrived later and was updated on the situation and explained the procedure by Dr. Estanislado Pandy prior to initiation of the procedure.  LKW: 6:30 AM on 12/28/2017 tpa given?: no, on Eliquis-last dose less than 12 hours ago Premorbid modified Rankin scale (mRS): 2 ROS:ROS was performed and is negative except as noted in the HPI.   Past Medical History:  Diagnosis Date  . CAD (coronary artery disease)   . Diabetes mellitus    type 2  . Hyperlipidemia   . Hypertension   . Past heart attack 1998  Atrial fibrillation Carotid stenosis  Family History  Problem Relation Age of Onset  . Bone cancer Mother 68       died  . Heart failure Father 10        died   Social History:   reports that she quit smoking about 21 years ago. Her smoking use included cigarettes. She has a 80.00 pack-year smoking history. She has never used smokeless tobacco. She reports that she does not drink alcohol or use drugs.  Medications  Current Facility-Administered Medications:  .   stroke: mapping our early stages of recovery book, , Does not apply, Once, Amie Portland, MD .  0.9 %  sodium chloride infusion, , Intravenous, Continuous, Amie Portland, MD .  acetaminophen (TYLENOL) tablet 650 mg, 650 mg, Oral, Q4H PRN **OR** acetaminophen (TYLENOL) solution 650 mg, 650 mg, Per Tube, Q4H PRN **OR** acetaminophen (TYLENOL) suppository 650 mg, 650 mg, Rectal, Q4H PRN, Amie Portland, MD .  aspirin 325 MG tablet, , , ,  .  ceFAZolin (ANCEF) 2-4 GM/100ML-% IVPB, , , ,  .  clopidogrel (PLAVIX) 300 MG tablet, , , ,  .  diphenhydrAMINE (BENADRYL) 50 MG/ML injection, , , ,  .  eptifibatide (INTEGRILIN) 20 MG/10ML injection, , , ,  .  lidocaine (PF) (XYLOCAINE) 1 % injection, , , ,  .  lidocaine (XYLOCAINE) 1 % (with pres) injection, , , ,  .  methylPREDNISolone sodium succinate (SOLU-MEDROL) 125 mg/2 mL injection, , , ,  .  nitroGLYCERIN 100 mcg/mL intra-arterial injection, , , ,  .  senna-docusate (Senokot-S) tablet 1 tablet, 1 tablet, Oral, QHS PRN, Amie Portland, MD .  ticagrelor (BRILINTA) 90 MG tablet, , , ,  .  tirofiban (AGGRASTAT) 5-0.9 MG/100ML-% injection, , , ,   Current Outpatient Medications:  .  amLODipine (NORVASC) 10 MG tablet, Take 10 mg by mouth daily.  , Disp: , Rfl:  .  aspirin 81 MG tablet, Take 81 mg by mouth daily.  , Disp: , Rfl:  .  busPIRone (BUSPAR) 15 MG tablet, Take 15 mg by mouth 2 (two) times daily., Disp: , Rfl:  .  gabapentin (NEURONTIN) 400 MG capsule, Take 400 mg by mouth daily., Disp: , Rfl:  .  glipiZIDE (GLUCOTROL) 5 MG tablet, Take 5 mg by mouth daily before breakfast. , Disp: , Rfl:  .  losartan (COZAAR) 100 MG tablet, Take 100  mg by mouth Daily. , Disp: , Rfl:  .  metFORMIN (GLUCOPHAGE) 1000 MG tablet, Take 500 mg by mouth daily with breakfast. , Disp: , Rfl:  .  metoprolol tartrate (LOPRESSOR) 100 MG tablet, Take 2 tablets (200 mg total) by mouth 2 (two) times daily., Disp: 360 tablet, Rfl: 3 .  Omega-3 Fatty Acids (FISH OIL) 1000 MG CAPS, Take 2 capsules by mouth daily. , Disp: , Rfl:  .  oxyCODONE-acetaminophen (PERCOCET) 7.5-325 MG per tablet, Take 1 tablet by mouth every 6 (six) hours as needed for severe pain. , Disp: , Rfl:  .  pravastatin (PRAVACHOL) 40 MG tablet, Take 40 mg by mouth daily.  , Disp: , Rfl:  .  zolpidem (AMBIEN) 5 MG tablet, Take 1 tablet (5 mg total) by mouth at bedtime as needed. for sleep, Disp: 90 tablet, Rfl: 2  Facility-Administered Medications Ordered in Other Encounters:  .  ceFAZolin (ANCEF) IVPB 2 g/50 mL premix, , Intravenous, Anesthesia Intra-op, Dewitt Hoes C., CRNA, 2 g at 12/28/17 0919 .  diphenhydrAMINE (BENADRYL) injection, , , Anesthesia Intra-op, Dewitt Hoes C., CRNA, 50 mg at 12/28/17 0906 .  methylPREDNISolone sodium succinate (SOLU-MEDROL) 125 mg/2 mL injection, , , Anesthesia Intra-op, Dewitt Hoes C., CRNA, 125 mg at 12/28/17 0905 Exam: Current vital signs: BP (!) 156/89 (BP Location: Right Arm)   Pulse 69  Vital signs in last 24 hours: Pulse Rate:  [69] 69 (10/09 0850) BP: (156)/(89) 156/89 (10/09 0850) GENERAL: Awake, alert in NAD HEENT: - Normocephalic and atraumatic, dry mm, no LN++, no Thyromegally LUNGS - Clear to auscultation bilaterally with no wheezes CV - S1S2 RRR, no m/r/g, equal pulses bilaterally. ABDOMEN - Soft, nontender, nondistended with normoactive BS Ext: warm, well perfused, intact peripheral pulses, noedema  NEURO:  Mental Status: AA&Ox3  Language: speech is mildly dysarthric.  Naming, repetition, fluency, and comprehension intact. Cranial Nerves: PERRL, right gaze preference with inability to cross the midline to the left, visual  fields-left homonymous hemianopsia, left lower facial weakness, facial sensation intact, hearing intact, tongue/uvula/soft palate midline, normal sternocleidomastoid and trapezius muscle strength. No evidence of tongue atrophy or fibrillations Motor: Left upper extremity 4/5 with vertical drift, left lower extremity 4/5 with vertical drift, right upper extremity 5/5 with no drift, right lower extremity 5/5 with no drift. Tone: is normal and bulk is normal Sensation-sensory and visual neglect of the left side, decreased sensation on the left compared to the right, extinction on double simultaneous stimulation Coordination: FTN intact bilaterally Gait- deferred  NIHSS-9   Labs I have reviewed labs in epic and the results pertinent to this consultation are: CBC    Component Value Date/Time   WBC 8.4 12/28/2017 0827   RBC 4.72 12/28/2017 0827   HGB 13.6 12/28/2017 0853   HCT 40.0 12/28/2017 0853  PLT 179 12/28/2017 0827   MCV 87.9 12/28/2017 0827   MCH 29.2 12/28/2017 0827   MCHC 33.3 12/28/2017 0827   RDW 13.8 12/28/2017 0827   LYMPHSABS 1.1 12/28/2017 0827   MONOABS 0.7 12/28/2017 0827   EOSABS 0.3 12/28/2017 0827   BASOSABS 0.0 12/28/2017 0827    CMP     Component Value Date/Time   NA 141 12/28/2017 0853   K 3.9 12/28/2017 0853   CL 103 12/28/2017 0853   CO2 27 05/11/2010 1544   GLUCOSE 138 (H) 12/28/2017 0853   BUN 14 12/28/2017 0853   CREATININE 0.80 12/28/2017 0853   CALCIUM 9.7 05/11/2010 1544   GFRNONAA >60 05/11/2010 1544   GFRAA  05/11/2010 1544    >60        The eGFR has been calculated using the MDRD equation. This calculation has not been validated in all clinical situations. eGFR's persistently <60 mL/min signify possible Chronic Kidney Disease.   Imaging I have reviewed the images obtained: CT-scan of the brain-aspects 10, possible new right occipital infarct-age indeterminate, aspects 10.  No bleed.  Chronic white matter disease.   Assessment:   77 year old with new diagnosis of atrial fibrillation recently started on Eliquis, history of right carotid endarterectomy, 2 previous strokes with some residual gait difficulty presented with Sudden onset of left-sided weakness and exam consistent with right MCA syndrome with last known normal at 6:30 AM presenting for evaluation and treatment. Not a candidate for IV TPA because of being on Eliquis-last dose last night. No CTA performed due to severe diet allergy. Taken straight for endovascular thrombectomy after consenting the patient. Family arrived later and was updated on the situation and also consented to the procedure. Likely acute ischemic stroke due to cardio embolic etiology from atrial fibrillation versus atheroembolic artery to artery from her known carotid stenosis.  Recommendations: Cerebral infarction due to embolism of right middle cerebral artery  Acuity: Acute Current Suspected Etiology: Cardio embolic versus atheroembolic Continue Evaluation:  -Admit to: Neurological ICU -Decision on aspirin depending on the course of the intervention -Blood pressure control, goal of SYS <140 if successfully recanalized.  Otherwise allow for permissive hypertension. -MRI/ECHO/A1C/Lipid panel. -Hyperglycemia management per SSI to maintain glucose 140-167m/dL. -PT/OT/ST therapies and recommendations when able  CNS -Close neuro monitoring  Dysarthria Dysphagia following cerebral infarction  -NPO until cleared by speech -ST -Advance diet as tolerated  Hemiplegia and hemiparesis following cerebral infarction affecting left non-dominant side  -PT/OT -PM&R consult  RESP Acute Respiratory Failure  -vent management per ICU -wean when able  COPD -Continue home meds -Continue oxygen via nasal cannula once extubated -Check chest x-ray  CV Systolic blood pressure goal as above TTE  Hyperlipidemia, unspecified  - Statin for goal LDL < 70  Chronic atrial fibrillation -Hold  anticoagulation for now  HEME Monitor labs and-transfuse for hgb < 7  ENDO -SSI -goal HgbA1c <7  GI/GU -Gentle hydration  Fluid/Electrolyte Disorders Labs Replete as necessary  ID Possible Aspiration PNA -CXR -NPO -Monitor  Prophylaxis DVT: Subcu heparin GI: Not applicable Bowel: Docusate/senna  Diet: NPO until cleared by speech  Code Status: Full Code  -- AAmie Portland MD [facility] Pager: [phone] 7pm to 7am, please call on call as listed on AMION.   CRITICAL CARE ATTESTATION This patient is critically ill and at significant risk of neurological worsening, death and care requires constant monitoring of vital signs, hemodynamics, respiratory, and cardiac monitoring. I spent 40  minutes of neurocritical care time performing neurological  assessment, discussion with family, other specialists and medical decision making of high complexity in the care of  this patient.   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  Patient being transferred back to the neurological ICU.  Would like to get MRI of the brain without contrast as soon as possible.  We will follow-up after imaging.  -- Amie Portland, MD [facility] Pager: [phone] If 7pm to 7am, please call on call as listed on AMION.

## 2017-12-28 NOTE — ED Triage Notes (Signed)
Pt here as a code stroke lsn 0630 while sitting on the toilet, pt presents with left sided weakness and left sided visual filed cut while riding with ems , cbg 129

## 2017-12-29 ENCOUNTER — Encounter (HOSPITAL_COMMUNITY): Payer: Self-pay | Admitting: Interventional Radiology

## 2017-12-29 DIAGNOSIS — I63411 Cerebral infarction due to embolism of right middle cerebral artery: Principal | ICD-10-CM

## 2017-12-29 DIAGNOSIS — E782 Mixed hyperlipidemia: Secondary | ICD-10-CM

## 2017-12-29 DIAGNOSIS — E1159 Type 2 diabetes mellitus with other circulatory complications: Secondary | ICD-10-CM

## 2017-12-29 LAB — GLUCOSE, CAPILLARY
GLUCOSE-CAPILLARY: 135 mg/dL — AB (ref 70–99)
GLUCOSE-CAPILLARY: 100 mg/dL — AB (ref 70–99)
GLUCOSE-CAPILLARY: 174 mg/dL — AB (ref 70–99)
GLUCOSE-CAPILLARY: 132 mg/dL — AB (ref 70–99)

## 2017-12-29 LAB — LIPID PANEL
Cholesterol: 205 mg/dL — ABNORMAL HIGH (ref 0–200)
HDL: 37 mg/dL — ABNORMAL LOW (ref >40)
LDL Cholesterol: 107 mg/dL — ABNORMAL HIGH (ref 0–99)
Total CHOL/HDL Ratio: 5.5 RATIO
Triglycerides: 307 mg/dL — ABNORMAL HIGH (ref <150)
VLDL: 61 mg/dL — ABNORMAL HIGH (ref 0–40)

## 2017-12-29 LAB — HEMOGLOBIN A1C
HEMOGLOBIN A1C: 5.7 % — AB (ref 4.8–5.6)
MEAN PLASMA GLUCOSE: 116.89 mg/dL

## 2017-12-29 MED ORDER — DIPHENOXYLATE-ATROPINE 2.5-0.025 MG PO TABS
1.0000 | ORAL_TABLET | Freq: Four times a day (QID) | ORAL | Status: DC | PRN
  Administered 2017-12-29 – 2018-01-02 (×7): 1 via ORAL
  Filled 2017-12-29 (×7): qty 1

## 2017-12-29 MED ORDER — APIXABAN 5 MG PO TABS
5.0000 mg | ORAL_TABLET | Freq: Two times a day (BID) | ORAL | Status: DC
  Administered 2017-12-29 – 2018-01-03 (×10): 5 mg via ORAL
  Filled 2017-12-29 (×10): qty 1

## 2017-12-29 MED ORDER — PRAVASTATIN SODIUM 40 MG PO TABS
40.0000 mg | ORAL_TABLET | Freq: Every day | ORAL | Status: DC
Start: 2017-12-29 — End: 2017-12-30
  Administered 2017-12-29 – 2017-12-30 (×2): 40 mg via ORAL
  Filled 2017-12-29 (×2): qty 1

## 2017-12-29 MED ORDER — SERTRALINE HCL 100 MG PO TABS
100.0000 mg | ORAL_TABLET | Freq: Every day | ORAL | Status: DC
  Administered 2017-12-29 – 2018-01-03 (×6): 100 mg via ORAL
  Filled 2017-12-29: qty 2
  Filled 2017-12-29 (×3): qty 1
  Filled 2017-12-29: qty 2
  Filled 2017-12-29: qty 1

## 2017-12-29 MED ORDER — METFORMIN HCL 500 MG PO TABS
1000.0000 mg | ORAL_TABLET | Freq: Two times a day (BID) | ORAL | Status: DC
  Administered 2017-12-29 – 2018-01-03 (×9): 1000 mg via ORAL
  Filled 2017-12-29 (×9): qty 2

## 2017-12-29 MED ORDER — MAGNESIUM OXIDE 400 (241.3 MG) MG PO TABS
400.0000 mg | ORAL_TABLET | Freq: Every day | ORAL | Status: DC
  Administered 2017-12-29 – 2017-12-31 (×3): 400 mg via ORAL
  Filled 2017-12-29 (×6): qty 1

## 2017-12-29 MED ORDER — ASPIRIN EC 325 MG PO TBEC
325.0000 mg | DELAYED_RELEASE_TABLET | Freq: Every day | ORAL | Status: DC
  Administered 2017-12-29: 325 mg via ORAL
  Filled 2017-12-29: qty 1

## 2017-12-29 MED ORDER — BUTALBITAL-APAP-CAFFEINE 50-325-40 MG PO TABS
1.0000 | ORAL_TABLET | Freq: Two times a day (BID) | ORAL | Status: DC | PRN
  Administered 2017-12-29 – 2018-01-01 (×5): 1 via ORAL
  Filled 2017-12-29 (×5): qty 1

## 2017-12-29 MED ORDER — ASPIRIN 300 MG RE SUPP: 300.0000 mg | Freq: Once | RECTAL | Status: DC

## 2017-12-29 MED ORDER — BUSPIRONE HCL 15 MG PO TABS
15.0000 mg | ORAL_TABLET | Freq: Two times a day (BID) | ORAL | Status: DC
  Administered 2017-12-29 – 2018-01-03 (×10): 15 mg via ORAL
  Filled 2017-12-29 (×11): qty 1

## 2017-12-29 MED ORDER — ASPIRIN EC 81 MG PO TBEC
81.0000 mg | DELAYED_RELEASE_TABLET | Freq: Every day | ORAL | Status: DC
  Administered 2017-12-30 – 2018-01-03 (×5): 81 mg via ORAL
  Filled 2017-12-29 (×5): qty 1

## 2017-12-29 MED ORDER — METOPROLOL TARTRATE 50 MG PO TABS: 200.0000 mg | ORAL_TABLET | Freq: Two times a day (BID) | ORAL | Status: DC

## 2017-12-29 MED ORDER — AMLODIPINE BESYLATE 10 MG PO TABS
10.0000 mg | ORAL_TABLET | Freq: Every day | ORAL | Status: DC
  Administered 2017-12-29 – 2018-01-03 (×6): 10 mg via ORAL
  Filled 2017-12-29 (×6): qty 1

## 2017-12-29 MED ORDER — METOPROLOL TARTRATE 50 MG PO TABS
100.0000 mg | ORAL_TABLET | Freq: Two times a day (BID) | ORAL | Status: DC
  Administered 2017-12-29 – 2018-01-03 (×10): 100 mg via ORAL
  Filled 2017-12-29 (×10): qty 2

## 2017-12-29 NOTE — Progress Notes (Signed)
SLP Cancellation Note  Patient Details Name: Catherine Greene MRN: [id number] DOB: 23-Jul-1940   Cancelled treatment:       Reason Eval/Treat Not Completed: Other (comment). Will f/u tomorrow to address cognitive linguistic order after pt better able to participate.    Shatyra Becka, Katherene Ponto 12/29/2017, 1:43 PM

## 2017-12-29 NOTE — Progress Notes (Signed)
Called radiology to see update on time to remove aline to progress patient. 2H nurse on floor able to remove aline, Dr. Estanislado Pandy states this is fine. Aline removed by Estill Bamberg RN, vpad applied. Pulses doppler bilaterally, groin a level zero. Assessed with Estill Bamberg at bedside. Will continue to monitor. Lianne Bushy RN BSN.

## 2017-12-29 NOTE — Progress Notes (Signed)
PT Cancellation Note  Patient Details Name: Catherine Greene MRN: [id number] DOB: 12/19/1940   Cancelled Treatment:    Reason Eval/Treat Not Completed: Patient not medically ready;Active bedrest order.   Bedrest post sheath removal.  Will try to see as able 10/11. 12/29/2017  Donnella Sham, [place] [phone]  (pager) [phone]  (office)   Tessie Fass Creighton Longley 12/29/2017, 2:26 PM

## 2017-12-29 NOTE — Progress Notes (Signed)
STROKE TEAM PROGRESS NOTE   SUBJECTIVE (INTERVAL HISTORY) Her son and RN are at the bedside.  Overall she feels her condition is gradually improving. She still has femoral A line in place so not able to get up with PT/OT or do speech/swallow test. However, her right gaze improved but still has mild left sided weakness.    OBJECTIVE Temp:  [97.5 F (36.4 C)-97.9 F (36.6 C)] 97.5 F (36.4 C) (10/10 0800) Pulse Rate:  [75-92] 89 (10/10 1000) Cardiac Rhythm: Normal sinus rhythm (10/10 0800) Resp:  [11-34] 13 (10/10 1000) BP: (116-160)/(40-119) 133/118 (10/10 1000) SpO2:  [90 %-100 %] 98 % (10/10 1000) Arterial Line BP: (113-182)/(43-73) 143/54 (10/10 1000)  Recent Labs  Lab 12/28/17 0842 12/28/17 1128 12/28/17 1836 12/28/17 2148 12/29/17 0726  GLUCAP 122* 184* 209* 145* 135*   Recent Labs  Lab 12/28/17 0827 12/28/17 0853  NA 139 141  K 4.4 3.9  CL 103 103  CO2 27  --   GLUCOSE 131* 138*  BUN 13 14  CREATININE 0.78 0.80  CALCIUM 9.3  --    Recent Labs  Lab 12/28/17 0827  AST 34  ALT 17  ALKPHOS 52  BILITOT 1.4*  PROT 6.6  ALBUMIN 3.9   Recent Labs  Lab 12/28/17 0827 12/28/17 0853  WBC 8.4  --   NEUTROABS 6.2  --   HGB 13.8 13.6  HCT 41.5 40.0  MCV 87.9  --   PLT 179  --    No results for input(s): CKTOTAL, CKMB, CKMBINDEX, TROPONINI in the last 168 hours. Recent Labs    12/28/17 0827  LABPROT 15.6*  INR 1.25   No results for input(s): COLORURINE, LABSPEC, PHURINE, GLUCOSEU, HGBUR, BILIRUBINUR, KETONESUR, PROTEINUR, UROBILINOGEN, NITRITE, LEUKOCYTESUR in the last 72 hours.  Invalid input(s): APPERANCEUR     Component Value Date/Time   CHOL 205 (H) 12/29/2017 0455   TRIG 307 (H) 12/29/2017 0455   HDL 37 (L) 12/29/2017 0455   CHOLHDL 5.5 12/29/2017 0455   VLDL 61 (H) 12/29/2017 0455   LDLCALC 107 (H) 12/29/2017 0455   Lab Results  Component Value Date   HGBA1C 5.7 (H) 12/29/2017   No results found for: LABOPIA, [place], [place],  [place], [place], [place]  Recent Labs  Lab 12/28/17 0827  ETH <10    I have personally reviewed the radiological images below and agree with the radiology interpretations.  Mr Brain Wo Contrast  Result Date: 12/28/2017 CLINICAL DATA:  Acute onset of right-sided weakness. Previous infarcts. Diabetes. Acute onset of symptoms beginning 10 hours ago. EXAM: MRI HEAD WITHOUT CONTRAST MRA HEAD WITHOUT CONTRAST TECHNIQUE: Multiplanar, multiecho pulse sequences of the brain and surrounding structures were obtained without intravenous contrast. Angiographic images of the head were obtained using MRA technique without contrast. COMPARISON:  CT head without contrast from the same day. Cerebral arteriogram from the same day. FINDINGS: MRI HEAD FINDINGS Brain: The diffusion-weighted images demonstrate areas of acute cortical infarction involving the right middle frontal gyrus and the precentral gyrus. There are areas of involvement in the right parietal lobe in occluding the right postcentral gyrus. Scattered foci of restricted diffusion are present in the right temporal and occipital lobe along a watershed distribution. A more remote right occipital pole infarct is present. No hemorrhage is present. T2 signal changes are present within the infarct areas. There is moderate diffuse T2 signal change or bilaterally. Advanced atrophy is present. Dilated perivascular spaces are present in the corona radiata bilaterally. White matter changes extend into the  brainstem. Vascular: Abnormal signal is present in the left internal carotid artery to the cavernous segment. This is consistent with the known occlusion. Right ICA flow and posterior circulation flow is present. Skull and upper cervical spine: The skull base is normal. The craniocervical junction is normal. There is slight retrolisthesis at C3-4 with disc disease at C3-4 and C4-5. Sinuses/Orbits: The paranasal sinuses are clear. Bilateral mastoid effusions are present. No  obstructing nasopharyngeal lesion is present. Globes and orbits are within normal limits. MRA HEAD FINDINGS Left ICA occlusion is noted. There is marked decreased signal in the reconstituted right IC left ICA terminus. Decreased signal extends into the left a 2 segment with significant signal loss in left MCA vessels. Right A1 scratched at the right internal carotid artery is within normal limits. There is a mild to moderate stenosis at the anterior genu. The right ICA terminus is normal. A1 and M1 segments are normal. There is some attenuation of distal MCA branch vessels without a significant proximal occlusion. The left vertebral artery is the dominant vessel. Left PICA origin is visualized. The right PICA is not visualized. Basilar artery is normal. Both posterior cerebral arteries originate from the basilar tip. PCA branch vessels are within normal limits. IMPRESSION: 1. Scattered foci of acute/subacute cortical infarcts involving the right frontal, parietal, temporal, and occipital lobes. These may be due to proximal emboli. Watershed infarcts are also considered. 2. More remote cortical infarct involves the right occipital pole. 3. Advanced white matter disease reflects underlying microvascular ischemia. 4. 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 Decreased flow to the left ICA terminus, left ACA, and left MCA vessels. 5. Mild to moderate narrowing at the genu of the cavernous right internal carotid artery. 6. No other significant proximal stenosis on the right to account for the areas of infarction. No emergent large vessel occlusion on the right. 7. The posterior circulation is unremarkable. Electronically Signed   By: San Morelle M.D.   On: 12/28/2017 16:48   Dg Chest Port 1 View  Result Date: 12/28/2017 CLINICAL DATA:  Acute ischemic stroke. EXAM: PORTABLE CHEST 1 VIEW COMPARISON:  09/04/2017 FINDINGS: Shallow inspiration. Heart size and  pulmonary vascularity are normal. Minimal linear atelectasis in the lung bases. No focal consolidation or edema. No blunting of costophrenic angles. No pneumothorax. Mediastinal contours appear intact. Surgical clips in the base of the neck. Degenerative changes in the spine and shoulders. Old rib fractures. IMPRESSION: Shallow inspiration with minimal linear atelectasis in the lung bases. No focal consolidation or edema. Electronically Signed   By: Lucienne Capers M.D.   On: 12/28/2017 21:16   Mr Jodene Nam Head Wo Contrast  Result Date: 12/28/2017 CLINICAL DATA:  Acute onset of right-sided weakness. Previous infarcts. Diabetes. Acute onset of symptoms beginning 10 hours ago. EXAM: MRI HEAD WITHOUT CONTRAST MRA HEAD WITHOUT CONTRAST TECHNIQUE: Multiplanar, multiecho pulse sequences of the brain and surrounding structures were obtained without intravenous contrast. Angiographic images of the head were obtained using MRA technique without contrast. COMPARISON:  CT head without contrast from the same day. Cerebral arteriogram from the same day. FINDINGS: MRI HEAD FINDINGS Brain: The diffusion-weighted images demonstrate areas of acute cortical infarction involving the right middle frontal gyrus and the precentral gyrus. There are areas of involvement in the right parietal lobe in occluding the right postcentral gyrus. Scattered foci of restricted diffusion are present in the right temporal and occipital lobe along a watershed distribution. A more remote right occipital pole  infarct is present. No hemorrhage is present. T2 signal changes are present within the infarct areas. There is moderate diffuse T2 signal change or bilaterally. Advanced atrophy is present. Dilated perivascular spaces are present in the corona radiata bilaterally. White matter changes extend into the brainstem. Vascular: Abnormal signal is present in the left internal carotid artery to the cavernous segment. This is consistent with the known  occlusion. Right ICA flow and posterior circulation flow is present. Skull and upper cervical spine: The skull base is normal. The craniocervical junction is normal. There is slight retrolisthesis at C3-4 with disc disease at C3-4 and C4-5. Sinuses/Orbits: The paranasal sinuses are clear. Bilateral mastoid effusions are present. No obstructing nasopharyngeal lesion is present. Globes and orbits are within normal limits. MRA HEAD FINDINGS Left ICA occlusion is noted. There is marked decreased signal in the reconstituted right IC left ICA terminus. Decreased signal extends into the left a 2 segment with significant signal loss in left MCA vessels. Right A1 scratched at the right internal carotid artery is within normal limits. There is a mild to moderate stenosis at the anterior genu. The right ICA terminus is normal. A1 and M1 segments are normal. There is some attenuation of distal MCA branch vessels without a significant proximal occlusion. The left vertebral artery is the dominant vessel. Left PICA origin is visualized. The right PICA is not visualized. Basilar artery is normal. Both posterior cerebral arteries originate from the basilar tip. PCA branch vessels are within normal limits. IMPRESSION: 1. Scattered foci of acute/subacute cortical infarcts involving the right frontal, parietal, temporal, and occipital lobes. These may be due to proximal emboli. Watershed infarcts are also considered. 2. More remote cortical infarct involves the right occipital pole. 3. Advanced white matter disease reflects underlying microvascular ischemia. 4. 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 Decreased flow to the left ICA terminus, left ACA, and left MCA vessels. 5. Mild to moderate narrowing at the genu of the cavernous right internal carotid artery. 6. No other significant proximal stenosis on the right to account for the areas of infarction. No emergent large vessel  occlusion on the right. 7. The posterior circulation is unremarkable. Electronically Signed   By: San Morelle M.D.   On: 12/28/2017 16:48   Ct Head Code Stroke Wo Contrast  Result Date: 12/28/2017 CLINICAL DATA:  Code stroke. Acute onset of left-sided weakness and right-sided gaze. Focal neuro deficit of less than 6 hours. EXAM: CT HEAD WITHOUT CONTRAST TECHNIQUE: Contiguous axial images were obtained from the base of the skull through the vertex without intravenous contrast. COMPARISON:  CT head without contrast 10/21/2009. FINDINGS: Brain: Mild atrophy and moderate white matter disease has progressed since the prior exam. No acute anterior circulation infarct is present. Asymmetric ischemic changes of the right basal ganglia are stable. A new right occipital pole infarct is present. This is age indeterminate. No acute hemorrhage or mass lesion is present. The brainstem and cerebellum are normal. Ventricles are proportionate to the degree of atrophy. No significant extra-axial fluid collection is present. Vascular: Atherosclerotic calcifications are present in the cavernous internal carotid arteries. No significant asymmetric hyperdensity is evident. Skull: Calvarium is intact. No focal lytic or blastic lesions are present. No significant extracranial soft tissue lesion is present. Sinuses/Orbits: The paranasal sinuses and mastoid air cells are clear. ASPECTS [facility] Stroke Program Early CT Score) - Ganglionic level infarction (caudate, lentiform nuclei, internal capsule, insula, M1-M3 cortex): 7/7 - Supraganglionic infarction (M4-M6 cortex):  3/3 Total score (0-10 with 10 being normal): 10/10 IMPRESSION: 1. Right occipital lobe infarct is new since the prior exam. This is age indeterminate. Acute infarct is not excluded. 2. No acute anterior circulation infarct. 3. Stable remote ischemic changes of the basal ganglia, right greater than left. 4. Progressive atrophy and white matter disease. This likely  reflects the sequela of chronic microvascular ischemia. 5. Atherosclerotic changes without a hyperdense vessel. 6. ASPECTS is 10/10 These results were called by telephone at the time of interpretation on 12/28/2017 at 8:40am to Dr. Rory Percy, who verbally acknowledged these results. Electronically Signed   By: San Morelle M.D.   On: 12/28/2017 08:49   DSA RT CFA appeoach.Findings. 1.No occlusions,stenosis,or filling defects or dissections involving RT anterior circulation. 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 3.Occluded  Lt ICA prox, with modest reconstitution of Lt ICA cavernous seg and Lt MCA and ACA from Lt ECA branches via  ophthalmic  Artery.  TTE  - Left ventricle: The cavity size was normal. There was moderate   concentric hypertrophy. Systolic function was vigorous. The   estimated ejection fraction was in the range of 65% to 70%. Wall   motion was normal; there were no regional wall motion   abnormalities. There was an increased relative contribution of   atrial contraction to ventricular filling. Doppler parameters are   consistent with abnormal left ventricular relaxation (grade 1   diastolic dysfunction). Doppler parameters are consistent with   high ventricular filling pressure. - Aortic valve: Mild focal calcification involving the right   coronary cusp. - Mitral valve: Calcified annulus.   PHYSICAL EXAM  Temp:  [97.5 F (36.4 C)-97.9 F (36.6 C)] 97.5 F (36.4 C) (10/10 0800) Pulse Rate:  [75-92] 89 (10/10 1000) Resp:  [11-34] 13 (10/10 1000) BP: (116-160)/(40-119) 133/118 (10/10 1000) SpO2:  [90 %-100 %] 98 % (10/10 1000) Arterial Line BP: (113-182)/(43-73) 143/54 (10/10 1000)  General - Well nourished, well developed, in no apparent distress.  Ophthalmologic - fundi not visualized due to noncooperation.  Cardiovascular - Regular rate and rhythm, not in afib.  Mental Status -  Level of arousal and orientation to time, place, and person were intact. Language  including expression, naming, repetition, comprehension was assessed and found intact.  Cranial Nerves II - XII - II - Visual field intact OU, however, simultagnosia on the left with bilateral visual confrontation. III, IV, VI - Extraocular movements showed right gaze preference but able to cross midline to left although not complete. V - Facial sensation intact bilaterally. VII - left nasolabial fold flattening. VIII - Hearing & vestibular intact bilaterally. X - Palate elevates symmetrically. XI - Chin turning & shoulder shrug intact bilaterally. XII - Tongue protrusion intact.  Motor Strength - The patient's strength was 3/5 LUE, 4/5 RUE, BLE 4/5 proximal and 5/5 distal.  Bulk was normal and fasciculations were absent.   Motor Tone - Muscle tone was assessed at the neck and appendages and was normal.  Reflexes - The patient's reflexes were symmetrical in all extremities and she had no pathological reflexes.  Sensory - Light touch, temperature/pinprick were assessed and were decreased on the left, about 50% of the right.     Coordination - The patient had normal movements in the right hands with no ataxia or dysmetria, not able to test left hand due to weakness.  Tremor was absent.  Gait and Station - deferred.   ASSESSMENT/PLAN Ms. Catherine Greene is a 77 y.o. female with history of CAD,  DM, HTN, HLD, ?PAF on eliquis, left ICA chronic occlusion, right CEA, and strokes with BLE weakness walking with cane at baseline admitted for left sided weakness and right gaze. No tPA given due to on eliquis.    Stroke:  right MCA scattered infarct embolic secondary to ? PAF on eliquis  Resultant right gaze preference and left UE weakness  MRI  Right MCA scattered infarts  MRA Occluded left ICA with partial revascularization of the left ICA terminus. Decreased flow to the left ICA terminus, left ACA, and left MCA vessels  DSA - Occluded  Lt ICA prox, with modest reconstitution of Lt ICA cavernous  seg and Lt MCA and ACA from Lt ECA branches via  ophthalmic  Artery  2D Echo  EF 65-70%  LDL 107  HgbA1c 5.7  Heparin subq for VTE prophylaxis  NPO for now  aspirin 81 mg daily and Eliquis (apixaban) daily prior to admission, now on aspirin 300 mg suppository daily.   Patient counseled to be compliant with her antithrombotic medications  Ongoing aggressive stroke risk factor management  Therapy recommendations:  pending  Disposition:  Pending  ?? PAF on eliquis  As per med list, she is on eliquis 5mg  bid and ASA 81 at home  Not sure if for PAF as no documented afib in PCP's note  Called her PCP Dr. Rosana Hoes in [facility] but not available for phone call today  NPO for now  On ASA PR now  Consider resume eliquis once po access  Carotid stenosis/occlusion  Right ICA CEA in the past  Left ICA chronic occlusion with decreased from left tICA, MCA and ACA  On ASA 81 and eliquis at home  Now on ASA PR  Continue eliquis once po access  Diabetes  HgbA1c 5.7 goal < 7.0  Controlled  Home meds - glipizide and metformin  CBG monitoring  SSI  DM education and close PCP follow up  Hypertension . Stable on cleviprex . Will resume po meds once po access . On cleviprex, taper off as able  Long term BP goal 130-160 given left ICA occlusion with decreased flow at left MCA, ACA, tICA  Hyperlipidemia  Home meds:  pravastatin 40  LDL 107, goal < 70  Now on no statin due to NPO  Continue statin at discharge  Other Stroke Risk Factors  Advanced age  Coronary artery disease  Other Active Problems  Elevated TG  Hospital day # 1  This patient is critically ill due to right MCA infarct, PAF, DM and at significant risk of neurological worsening, death form recurrent stroke, hemorrhagic conversion, heart failure, seizure, DKA. This patient's care requires constant monitoring of vital signs, hemodynamics, respiratory and cardiac monitoring, review of multiple  databases, neurological assessment, discussion with family, other specialists and medical decision making of high complexity. I spent 40 minutes of neurocritical care time in the care of this patient. I had long discussion with pt and son at bedside, updated pt current condition, treatment plan and potential prognosis. They expressed understanding and appreciation.     Rosalin Hawking, MD PhD Stroke Neurology 12/29/2017 10:09 AM    To contact Stroke Continuity provider, please refer to [email or web address] After hours, contact General Neurology

## 2017-12-29 NOTE — Progress Notes (Signed)
Right groin arterial line pulled at 1050. Pressure held for 20 minutes. pt tolerated well vitals stable. Pt instructed to apply pressure at site when coughing or sneezing. Pt demonstrated with teach back.

## 2017-12-29 NOTE — Progress Notes (Signed)
OT Cancellation Note  Patient Details Name: Catherine Greene MRN: [id number] DOB: 29-Sep-1940   Cancelled Treatment:    Reason Eval/Treat Not Completed: Active bedrest order.  Will reattempt.  Lucille Passy, OTR/L [place] Pager [phone] Office [phone]   Lucille Passy M 12/29/2017, 12:20 PM

## 2017-12-30 ENCOUNTER — Encounter (HOSPITAL_COMMUNITY): Payer: Self-pay | Admitting: Interventional Radiology

## 2017-12-30 DIAGNOSIS — Z7901 Long term (current) use of anticoagulants: Secondary | ICD-10-CM

## 2017-12-30 DIAGNOSIS — Z86711 Personal history of pulmonary embolism: Secondary | ICD-10-CM

## 2017-12-30 DIAGNOSIS — E785 Hyperlipidemia, unspecified: Secondary | ICD-10-CM

## 2017-12-30 LAB — BASIC METABOLIC PANEL
Anion gap: 9 (ref 5–15)
BUN: 21 mg/dL (ref 8–23)
CO2: 30 mmol/L (ref 22–32)
Calcium: 9.1 mg/dL (ref 8.9–10.3)
Chloride: 103 mmol/L (ref 98–111)
Creatinine, Ser: 0.71 mg/dL (ref 0.44–1.00)
Glucose, Bld: 141 mg/dL — ABNORMAL HIGH (ref 70–99)
POTASSIUM: 3.9 mmol/L (ref 3.5–5.1)
SODIUM: 142 mmol/L (ref 135–145)

## 2017-12-30 LAB — CBC
HCT: 37.2 % (ref 36.0–46.0)
Hemoglobin: 11.6 g/dL — ABNORMAL LOW (ref 12.0–15.0)
MCH: 28.1 pg (ref 26.0–34.0)
MCHC: 31.2 g/dL (ref 30.0–36.0)
MCV: 90.1 fL (ref 80.0–100.0)
NRBC: 0 % (ref 0.0–0.2)
PLATELETS: 164 10*3/uL (ref 150–400)
RBC: 4.13 MIL/uL (ref 3.87–5.11)
RDW: 14.5 % (ref 11.5–15.5)
WBC: 10.6 10*3/uL — AB (ref 4.0–10.5)

## 2017-12-30 LAB — TRIGLYCERIDES: TRIGLYCERIDES: 350 mg/dL — AB (ref <150)

## 2017-12-30 LAB — GLUCOSE, CAPILLARY
GLUCOSE-CAPILLARY: 176 mg/dL — AB (ref 70–99)
GLUCOSE-CAPILLARY: 97 mg/dL (ref 70–99)
Glucose-Capillary: 159 mg/dL — ABNORMAL HIGH (ref 70–99)
Glucose-Capillary: 159 mg/dL — ABNORMAL HIGH (ref 70–99)

## 2017-12-30 MED ORDER — PRAVASTATIN SODIUM 40 MG PO TABS
80.0000 mg | ORAL_TABLET | Freq: Every day | ORAL | Status: DC
  Administered 2017-12-31 – 2018-01-03 (×4): 80 mg via ORAL
  Filled 2017-12-30 (×4): qty 2

## 2017-12-30 NOTE — Evaluation (Signed)
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  MRI showed scattered infarcts involving the R fontal, parietal, temporal and occipital lobes.  Clinical Impression  Pt admitted with/for scattered infarcts in the R MCA disposition.  Pt needing mod assist of 1-2 persons for basic mobility at this time.  Pt currently limited functionally due to the problems listed. ( See problems list.)   Pt will benefit from PT to maximize function and safety in order to get ready for next venue listed below.     Follow Up Recommendations CIR    Equipment Recommendations  None recommended by PT;Other (comment)(TBA)    Recommendations for Other Services Rehab consult     Precautions / Restrictions Precautions Precautions: Fall      Mobility  Bed Mobility Overal bed mobility: Needs Assistance Bed Mobility: Supine to Sit     Supine to sit: Mod assist;+2 for physical assistance     General bed mobility comments: cues for sequencing and truncal assist up via R elbow.  Transfers Overall transfer level: Needs assistance Equipment used: Rolling walker (2 wheeled) Transfers: Sit to/from [facility] Sit to Stand: Mod assist;+2 safety/equipment Stand pivot transfers: Mod assist;+2 physical assistance       General transfer comment: cues for advancing LE's, stability and w/bearing assist as well as help maneuvering the RW  Ambulation/Gait             General Gait Details: step to pivotal steps in the RW to the chair  Stairs            Wheelchair Mobility    Modified Rankin (Stroke Patients Only) Modified Rankin (Stroke Patients Only) Pre-Morbid Rankin Score: Slight disability Modified  Rankin: Moderately severe disability     Balance Overall balance assessment: Needs assistance Sitting-balance support: Single extremity supported;Bilateral upper extremity supported Sitting balance-Leahy Scale: Poor Sitting balance - Comments: needing external assist   Standing balance support: Bilateral upper extremity supported Standing balance-Leahy Scale: Poor Standing balance comment: reliant on external assist and assistive device.                             Pertinent Vitals/Pain Pain Assessment: No/denies pain    Home Living Family/patient expects to be discharged to:: Private residence Living Arrangements: Spouse/significant other;Children Available Help at Discharge: Family;Available 24 hours/day Type of Home: Mobile home Home Access: Stairs to enter Entrance Stairs-Rails: Right;Left Entrance Stairs-Number of Steps: several Home Layout: One level Home Equipment: Cane - single point;Bedside commode      Prior Function Level of Independence: Independent with assistive device(s)         Comments: pt did have a gait disturbance, but independent in the home with/without AD and cane use outdoors     Hand Dominance   Dominant Hand: Right    Extremity/Trunk Assessment   Upper Extremity Assessment Upper Extremity Assessment: Defer to OT evaluation    Lower Extremity Assessment Lower Extremity Assessment: Generalized weakness(but not significant strength difference L to R)       Communication   Communication: Expressive difficulties  Cognition Arousal/Alertness: Awake/alert Behavior During Therapy: Flat affect;WFL for tasks assessed/performed Overall Cognitive Status: Within Functional Limits for tasks assessed  General Comments General comments (skin integrity, edema, etc.): slow processing, decreased attension to L side    Exercises     Assessment/Plan    PT Assessment Patient  needs continued PT services  PT Problem List Decreased strength;Decreased activity tolerance;Decreased balance;Decreased mobility;Decreased coordination       PT Treatment Interventions Gait training;DME instruction;Functional mobility training;Therapeutic activities;Balance training;Neuromuscular re-education;Patient/family education    PT Goals (Current goals can be found in the Care Plan section)  Acute Rehab PT Goals Patient Stated Goal: pt did not state PT Goal Formulation: With patient Time For Goal Achievement: 01/13/18 Potential to Achieve Goals: Good    Frequency Min 4X/week   Barriers to discharge        Co-evaluation               AM-PAC PT "6 Clicks" Daily Activity  Outcome Measure Difficulty turning over in bed (including adjusting bedclothes, sheets and blankets)?: Unable Difficulty moving from lying on back to sitting on the side of the bed? : Unable Difficulty sitting down on and standing up from a chair with arms (e.g., wheelchair, bedside commode, etc,.)?: Unable Help needed moving to and from a bed to chair (including a wheelchair)?: A Lot Help needed walking in hospital room?: A Lot Help needed climbing 3-5 steps with a railing? : A Lot 6 Click Score: 9    End of Session   Activity Tolerance: Patient tolerated treatment well Patient left: in chair;with call bell/phone within reach;with chair alarm set Nurse Communication: Mobility status PT Visit Diagnosis: Unsteadiness on feet (R26.81);Other abnormalities of gait and mobility (R26.89);Hemiplegia and hemiparesis Hemiplegia - Right/Left: Left Hemiplegia - dominant/non-dominant: Non-dominant Hemiplegia - caused by: Cerebral infarction    Time: 0175-1025 PT Time Calculation (min) (ACUTE ONLY): 37 min   Charges:   PT Evaluation $PT Eval Moderate Complexity: 1 Mod          12/30/2017  Donnella Sham, PT Acute Rehabilitation Services [phone]  (pager) [phone]  (office)  Tessie Fass Emelynn Rance 12/30/2017, 3:37 PM

## 2017-12-30 NOTE — Evaluation (Signed)
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  MRI showed scattered infarcts involving the R fontal, parietal, temporal and occipital lobes.   Clinical Impression   PT admitted with R MCA watershed infarct. Pt currently with functional limitiations due to the deficits listed below (see OT problem list). Pt currently with L side weakness and L inattention. Pt has assist of spouse and x2 children within the home. Pt requires total+2 Mod (A) for basic transfer and (A) to maintain L UE on RW.  Pt will benefit from skilled OT to increase their independence and safety with adls and balance to allow discharge CIR.     Follow Up Recommendations  CIR    Equipment Recommendations  (tba)    Recommendations for Other Services Other (comment)(palliative consult )     Precautions / Restrictions Precautions Precautions: Fall      Mobility Bed Mobility Overal bed mobility: Needs Assistance Bed Mobility: Supine to Sit     Supine to sit: Mod assist;+2 for physical assistance     General bed mobility comments: cues for sequencing and truncal assist up via R elbow.  Transfers Overall transfer level: Needs assistance Equipment used: Rolling walker (2 wheeled) Transfers: Sit to/from [facility] Sit to Stand: Mod assist;+2 safety/equipment Stand pivot transfers: Mod assist;+2 physical assistance       General transfer comment: cues for advancing LE's, stability and w/bearing assist as well as help maneuvering the RW    Balance Overall balance assessment: Needs assistance Sitting-balance support: Single extremity supported;Bilateral upper extremity supported Sitting balance-Leahy Scale:  Poor Sitting balance - Comments: needing external assist   Standing balance support: Bilateral upper extremity supported Standing balance-Leahy Scale: Poor Standing balance comment: reliant on external assist and assistive device.                           ADL either performed or assessed with clinical judgement   ADL Overall ADL's : Needs assistance/impaired Eating/Feeding: Minimal assistance;Sitting   Grooming: Minimal assistance;Sitting   Upper Body Bathing: Moderate assistance   Lower Body Bathing: Maximal assistance           Toilet Transfer: +2 for physical assistance;Moderate assistance Toilet Transfer Details (indicate cue type and reason): simulated transfer to chair          Functional mobility during ADLs: +2 for physical assistance;Moderate assistance;Rolling walker General ADL Comments: pt completed log rolling R and L for peri care and noted to have redness. pt is continent of bowel and bladder at this time and on bed pain. Pt reports having loose stool. pt agreeable oob to chair     Vision   Additional Comments: scanning into the visual L field with cues     Perception     Praxis      Pertinent Vitals/Pain Pain Assessment: No/denies pain     Hand Dominance Right   Extremity/Trunk Assessment Upper Extremity Assessment Upper Extremity Assessment: RUE deficits/detail;LUE deficits/detail RUE Deficits / Details: demonstrates decr shoulder flexion and AROM to 80 degrees. pt noted to have incr efforts at 80 degrees, bicep and tricep 4 out 5 MMT LUE Deficits / Details: noted to have decr AROM shoulder flexion to 30 degrees with  mod cues to attend to L UE. pt with decr grasp and bicep and tricep unabl e to take resistance so 3 out 5 MMT       Cervical / Trunk Assessment Cervical / Trunk Assessment: Kyphotic   Communication Communication Communication: Expressive difficulties   Cognition Arousal/Alertness: Awake/alert Behavior During  Therapy: Flat affect;WFL for tasks assessed/performed Overall Cognitive Status: Within Functional Limits for tasks assessed                                     General Comments  pt noted to have redness in peri area and provided hygiene with soap and water. pt dried and barrier cream applied. RN notified and RN also reports loose stool at this time    Exercises     Shoulder Instructions      Home Living Family/patient expects to be discharged to:: Private residence Living Arrangements: Spouse/significant other;Children Available Help at Discharge: Family;Available 24 hours/day Type of Home: Mobile home Home Access: Stairs to enter Entrance Stairs-Number of Steps: several Entrance Stairs-Rails: Right;Left Home Layout: One level     Bathroom Shower/Tub: Occupational psychologist: Standard     Home Equipment: Cane - single point;Bedside commode          Prior Functioning/Environment Level of Independence: Independent with assistive device(s)        Comments: pt did have a gait disturbance, but independent in the home with/without AD and cane use outdoors        OT Problem List: Decreased strength;Decreased activity tolerance;Decreased range of motion;Impaired balance (sitting and/or standing);Decreased cognition;Decreased safety awareness;Decreased knowledge of use of DME or AE;Decreased knowledge of precautions;Impaired UE functional use;Obesity      OT Treatment/Interventions: Self-care/ADL training;Therapeutic exercise;Neuromuscular education;Energy conservation;DME and/or AE instruction;Manual therapy;Modalities;Therapeutic activities;Patient/family education;Balance training;Visual/perceptual remediation/compensation;Cognitive remediation/compensation    OT Goals(Current goals can be found in the care plan section) Acute Rehab OT Goals Patient Stated Goal: to get off this bed pan OT Goal Formulation: With patient Time For Goal Achievement:  01/13/18 Potential to Achieve Goals: Good  OT Frequency: Min 3X/week   Barriers to D/C:            Co-evaluation PT/OT/SLP Co-Evaluation/Treatment: Yes Reason for Co-Treatment: For patient/therapist safety   OT goals addressed during session: ADL's and self-care;Proper use of Adaptive equipment and DME;Strengthening/ROM      AM-PAC PT "6 Clicks" Daily Activity     Outcome Measure Help from another person eating meals?: A Little Help from another person taking care of personal grooming?: A Little Help from another person toileting, which includes using toliet, bedpan, or urinal?: A Lot Help from another person bathing (including washing, rinsing, drying)?: A Lot Help from another person to put on and taking off regular upper body clothing?: A Little Help from another person to put on and taking off regular lower body clothing?: A Lot 6 Click Score: 15   End of Session Equipment Utilized During Treatment: Rolling walker;Gait belt Nurse Communication: Mobility status;Precautions  Activity Tolerance: Patient tolerated treatment well Patient left: in chair;with call bell/phone within reach;with chair alarm set;with nursing/sitter in room  OT Visit Diagnosis: Unsteadiness on feet (R26.81);Muscle weakness (generalized) (M62.81);Hemiplegia and hemiparesis Hemiplegia - Right/Left: Left Hemiplegia - dominant/non-dominant: Non-Dominant Hemiplegia - caused by: Cerebral infarction                Time: 1035-1100 OT Time Calculation (min): 25 min Charges:  OT General Charges $OT Visit: 1 Visit OT Evaluation $OT Eval Moderate Complexity: 1 Mod   Jeri Modena, OTR/L  Acute Rehabilitation Services Pager: [phone] Office: [phone] .   Parke Poisson B 12/30/2017, 6:11 PM

## 2017-12-30 NOTE — Progress Notes (Signed)
Rehab Admissions Coordinator Note:  Patient was screened by Cleatrice Burke for appropriateness for an Inpatient Acute Rehab Consult per PT recommendation.   At this time, we are recommending Inpatient Rehab consult.  Cleatrice Burke 12/30/2017, 4:16 PM  I can be reached at [phone] .

## 2017-12-30 NOTE — Progress Notes (Signed)
STROKE TEAM PROGRESS NOTE   SUBJECTIVE (INTERVAL HISTORY) Her PT and RN are at the bedside. She is working with PT. Able to walk a few steps with walker and assistance, sat to chair after. Still has left UE and mild left LE weakness, left neglect improved but not resolved. Pt stated that she is on eliquis due to clot in the lungs.    OBJECTIVE Temp:  [97.5 F (36.4 C)-99 F (37.2 C)] 97.5 F (36.4 C) (10/11 0400) Pulse Rate:  [64-98] 67 (10/11 0700) Cardiac Rhythm: Normal sinus rhythm (10/11 0400) Resp:  [11-26] 18 (10/11 0700) BP: (110-162)/(28-118) 139/45 (10/11 0700) SpO2:  [86 %-100 %] 94 % (10/11 0700) Arterial Line BP: (135-143)/(52-54) 143/54 (10/10 1000)  Recent Labs  Lab 12/29/17 0726 12/29/17 1115 12/29/17 1718 12/29/17 2140 12/30/17 0725  GLUCAP 135* 100* 174* 132* 176*   Recent Labs  Lab 12/28/17 0827 12/28/17 0853 12/30/17 0337  NA 139 141 142  K 4.4 3.9 3.9  CL 103 103 103  CO2 27  --  30  GLUCOSE 131* 138* 141*  BUN 13 14 21   CREATININE 0.78 0.80 0.71  CALCIUM 9.3  --  9.1   Recent Labs  Lab 12/28/17 0827  AST 34  ALT 17  ALKPHOS 52  BILITOT 1.4*  PROT 6.6  ALBUMIN 3.9   Recent Labs  Lab 12/28/17 0827 12/28/17 0853 12/30/17 0337  WBC 8.4  --  10.6*  NEUTROABS 6.2  --   --   HGB 13.8 13.6 11.6*  HCT 41.5 40.0 37.2  MCV 87.9  --  90.1  PLT 179  --  164   No results for input(s): CKTOTAL, CKMB, CKMBINDEX, TROPONINI in the last 168 hours. Recent Labs    12/28/17 0827  LABPROT 15.6*  INR 1.25   No results for input(s): COLORURINE, LABSPEC, PHURINE, GLUCOSEU, HGBUR, BILIRUBINUR, KETONESUR, PROTEINUR, UROBILINOGEN, NITRITE, LEUKOCYTESUR in the last 72 hours.  Invalid input(s): APPERANCEUR     Component Value Date/Time   CHOL 205 (H) 12/29/2017 0455   TRIG 350 (H) 12/30/2017 0337   HDL 37 (L) 12/29/2017 0455   CHOLHDL 5.5 12/29/2017 0455   VLDL 61 (H) 12/29/2017 0455   LDLCALC 107 (H) 12/29/2017 0455   Lab Results  Component  Value Date   HGBA1C 5.7 (H) 12/29/2017   No results found for: LABOPIA, [place], [place], [place], [place], [place]  Recent Labs  Lab 12/28/17 0827  ETH <10    I have personally reviewed the radiological images below and agree with the radiology interpretations.  Mr Brain Wo Contrast  Result Date: 12/28/2017 CLINICAL DATA:  Acute onset of right-sided weakness. Previous infarcts. Diabetes. Acute onset of symptoms beginning 10 hours ago. EXAM: MRI HEAD WITHOUT CONTRAST MRA HEAD WITHOUT CONTRAST TECHNIQUE: Multiplanar, multiecho pulse sequences of the brain and surrounding structures were obtained without intravenous contrast. Angiographic images of the head were obtained using MRA technique without contrast. COMPARISON:  CT head without contrast from the same day. Cerebral arteriogram from the same day. FINDINGS: MRI HEAD FINDINGS Brain: The diffusion-weighted images demonstrate areas of acute cortical infarction involving the right middle frontal gyrus and the precentral gyrus. There are areas of involvement in the right parietal lobe in occluding the right postcentral gyrus. Scattered foci of restricted diffusion are present in the right temporal and occipital lobe along a watershed distribution. A more remote right occipital pole infarct is present. No hemorrhage is present. T2 signal changes are present within the infarct areas. There is moderate  diffuse T2 signal change or bilaterally. Advanced atrophy is present. Dilated perivascular spaces are present in the corona radiata bilaterally. White matter changes extend into the brainstem. Vascular: Abnormal signal is present in the left internal carotid artery to the cavernous segment. This is consistent with the known occlusion. Right ICA flow and posterior circulation flow is present. Skull and upper cervical spine: The skull base is normal. The craniocervical junction is normal. There is slight retrolisthesis at C3-4 with disc disease at C3-4 and  C4-5. Sinuses/Orbits: The paranasal sinuses are clear. Bilateral mastoid effusions are present. No obstructing nasopharyngeal lesion is present. Globes and orbits are within normal limits. MRA HEAD FINDINGS Left ICA occlusion is noted. There is marked decreased signal in the reconstituted right IC left ICA terminus. Decreased signal extends into the left a 2 segment with significant signal loss in left MCA vessels. Right A1 scratched at the right internal carotid artery is within normal limits. There is a mild to moderate stenosis at the anterior genu. The right ICA terminus is normal. A1 and M1 segments are normal. There is some attenuation of distal MCA branch vessels without a significant proximal occlusion. The left vertebral artery is the dominant vessel. Left PICA origin is visualized. The right PICA is not visualized. Basilar artery is normal. Both posterior cerebral arteries originate from the basilar tip. PCA branch vessels are within normal limits. IMPRESSION: 1. Scattered foci of acute/subacute cortical infarcts involving the right frontal, parietal, temporal, and occipital lobes. These may be due to proximal emboli. Watershed infarcts are also considered. 2. More remote cortical infarct involves the right occipital pole. 3. Advanced white matter disease reflects underlying microvascular ischemia. 4. 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 Decreased flow to the left ICA terminus, left ACA, and left MCA vessels. 5. Mild to moderate narrowing at the genu of the cavernous right internal carotid artery. 6. No other significant proximal stenosis on the right to account for the areas of infarction. No emergent large vessel occlusion on the right. 7. The posterior circulation is unremarkable. Electronically Signed   By: San Morelle M.D.   On: 12/28/2017 16:48   Dg Chest Port 1 View  Result Date: 12/28/2017 CLINICAL DATA:  Acute ischemic stroke.  EXAM: PORTABLE CHEST 1 VIEW COMPARISON:  09/04/2017 FINDINGS: Shallow inspiration. Heart size and pulmonary vascularity are normal. Minimal linear atelectasis in the lung bases. No focal consolidation or edema. No blunting of costophrenic angles. No pneumothorax. Mediastinal contours appear intact. Surgical clips in the base of the neck. Degenerative changes in the spine and shoulders. Old rib fractures. IMPRESSION: Shallow inspiration with minimal linear atelectasis in the lung bases. No focal consolidation or edema. Electronically Signed   By: Lucienne Capers M.D.   On: 12/28/2017 21:16   Mr Jodene Nam Head Wo Contrast  Result Date: 12/28/2017 CLINICAL DATA:  Acute onset of right-sided weakness. Previous infarcts. Diabetes. Acute onset of symptoms beginning 10 hours ago. EXAM: MRI HEAD WITHOUT CONTRAST MRA HEAD WITHOUT CONTRAST TECHNIQUE: Multiplanar, multiecho pulse sequences of the brain and surrounding structures were obtained without intravenous contrast. Angiographic images of the head were obtained using MRA technique without contrast. COMPARISON:  CT head without contrast from the same day. Cerebral arteriogram from the same day. FINDINGS: MRI HEAD FINDINGS Brain: The diffusion-weighted images demonstrate areas of acute cortical infarction involving the right middle frontal gyrus and the precentral gyrus. There are areas of involvement in the right parietal lobe in occluding the right  postcentral gyrus. Scattered foci of restricted diffusion are present in the right temporal and occipital lobe along a watershed distribution. A more remote right occipital pole infarct is present. No hemorrhage is present. T2 signal changes are present within the infarct areas. There is moderate diffuse T2 signal change or bilaterally. Advanced atrophy is present. Dilated perivascular spaces are present in the corona radiata bilaterally. White matter changes extend into the brainstem. Vascular: Abnormal signal is present in the  left internal carotid artery to the cavernous segment. This is consistent with the known occlusion. Right ICA flow and posterior circulation flow is present. Skull and upper cervical spine: The skull base is normal. The craniocervical junction is normal. There is slight retrolisthesis at C3-4 with disc disease at C3-4 and C4-5. Sinuses/Orbits: The paranasal sinuses are clear. Bilateral mastoid effusions are present. No obstructing nasopharyngeal lesion is present. Globes and orbits are within normal limits. MRA HEAD FINDINGS Left ICA occlusion is noted. There is marked decreased signal in the reconstituted right IC left ICA terminus. Decreased signal extends into the left a 2 segment with significant signal loss in left MCA vessels. Right A1 scratched at the right internal carotid artery is within normal limits. There is a mild to moderate stenosis at the anterior genu. The right ICA terminus is normal. A1 and M1 segments are normal. There is some attenuation of distal MCA branch vessels without a significant proximal occlusion. The left vertebral artery is the dominant vessel. Left PICA origin is visualized. The right PICA is not visualized. Basilar artery is normal. Both posterior cerebral arteries originate from the basilar tip. PCA branch vessels are within normal limits. IMPRESSION: 1. Scattered foci of acute/subacute cortical infarcts involving the right frontal, parietal, temporal, and occipital lobes. These may be due to proximal emboli. Watershed infarcts are also considered. 2. More remote cortical infarct involves the right occipital pole. 3. Advanced white matter disease reflects underlying microvascular ischemia. 4. 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 Decreased flow to the left ICA terminus, left ACA, and left MCA vessels. 5. Mild to moderate narrowing at the genu of the cavernous right internal carotid artery. 6. No other significant proximal  stenosis on the right to account for the areas of infarction. No emergent large vessel occlusion on the right. 7. The posterior circulation is unremarkable. Electronically Signed   By: San Morelle M.D.   On: 12/28/2017 16:48   Ct Head Code Stroke Wo Contrast  Result Date: 12/28/2017 CLINICAL DATA:  Code stroke. Acute onset of left-sided weakness and right-sided gaze. Focal neuro deficit of less than 6 hours. EXAM: CT HEAD WITHOUT CONTRAST TECHNIQUE: Contiguous axial images were obtained from the base of the skull through the vertex without intravenous contrast. COMPARISON:  CT head without contrast 10/21/2009. FINDINGS: Brain: Mild atrophy and moderate white matter disease has progressed since the prior exam. No acute anterior circulation infarct is present. Asymmetric ischemic changes of the right basal ganglia are stable. A new right occipital pole infarct is present. This is age indeterminate. No acute hemorrhage or mass lesion is present. The brainstem and cerebellum are normal. Ventricles are proportionate to the degree of atrophy. No significant extra-axial fluid collection is present. Vascular: Atherosclerotic calcifications are present in the cavernous internal carotid arteries. No significant asymmetric hyperdensity is evident. Skull: Calvarium is intact. No focal lytic or blastic lesions are present. No significant extracranial soft tissue lesion is present. Sinuses/Orbits: The paranasal sinuses and mastoid air cells are  clear. ASPECTS ([place] Stroke Program Early CT Score) - Ganglionic level infarction (caudate, lentiform nuclei, internal capsule, insula, M1-M3 cortex): 7/7 - Supraganglionic infarction (M4-M6 cortex): 3/3 Total score (0-10 with 10 being normal): 10/10 IMPRESSION: 1. Right occipital lobe infarct is new since the prior exam. This is age indeterminate. Acute infarct is not excluded. 2. No acute anterior circulation infarct. 3. Stable remote ischemic changes of the basal  ganglia, right greater than left. 4. Progressive atrophy and white matter disease. This likely reflects the sequela of chronic microvascular ischemia. 5. Atherosclerotic changes without a hyperdense vessel. 6. ASPECTS is 10/10 These results were called by telephone at the time of interpretation on 12/28/2017 at 8:40am to Dr. Rory Percy, who verbally acknowledged these results. Electronically Signed   By: San Morelle M.D.   On: 12/28/2017 08:49   DSA RT CFA appeoach.Findings. 1.No occlusions,stenosis,or filling defects or dissections involving RT anterior circulation. 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 3.Occluded  Lt ICA prox, with modest reconstitution of Lt ICA cavernous seg and Lt MCA and ACA from Lt ECA branches via  ophthalmic  Artery.  TTE  - Left ventricle: The cavity size was normal. There was moderate   concentric hypertrophy. Systolic function was vigorous. The   estimated ejection fraction was in the range of 65% to 70%. Wall   motion was normal; there were no regional wall motion   abnormalities. There was an increased relative contribution of   atrial contraction to ventricular filling. Doppler parameters are   consistent with abnormal left ventricular relaxation (grade 1   diastolic dysfunction). Doppler parameters are consistent with   high ventricular filling pressure. - Aortic valve: Mild focal calcification involving the right   coronary cusp. - Mitral valve: Calcified annulus.   PHYSICAL EXAM  Temp:  [97.5 F (36.4 C)-99 F (37.2 C)] 97.5 F (36.4 C) (10/11 0400) Pulse Rate:  [64-98] 67 (10/11 0700) Resp:  [11-26] 18 (10/11 0700) BP: (110-162)/(28-118) 139/45 (10/11 0700) SpO2:  [86 %-100 %] 94 % (10/11 0700) Arterial Line BP: (135-143)/(52-54) 143/54 (10/10 1000)  General - Well nourished, well developed, in no apparent distress.  Ophthalmologic - fundi not visualized due to noncooperation.  Cardiovascular - Regular rate and rhythm, not in afib.  Mental  Status -  Level of arousal and orientation to time, place, and person were intact. Language including expression, naming, repetition, comprehension was assessed and found intact. Mild left neglect and inattention with PT.   Cranial Nerves II - XII - II - Visual field intact OU, and simultagnosia resolved. III, IV, VI - Extraocular movements intact.  V - Facial sensation intact bilaterally. VII - left nasolabial fold flattening. VIII - Hearing & vestibular intact bilaterally. X - Palate elevates symmetrically. XI - Chin turning & shoulder shrug intact bilaterally. XII - Tongue protrusion intact.  Motor Strength - The patient's strength was 3/5 LUE, 4+/5 RUE, LLE 4/5 proximal and 5/5 distal, RLE 4+/5 proximal and 5/5 distal.  Bulk was normal and fasciculations were absent.   Motor Tone - Muscle tone was assessed at the neck and appendages and was normal.  Reflexes - The patient's reflexes were symmetrical in all extremities and she had no pathological reflexes.  Sensory - Light touch, temperature/pinprick were assessed and were decreased on the left, about 50% of the right.     Coordination - The patient had normal movements in the right hands with no ataxia or dysmetria, not able to test left hand due to weakness.  Tremor was  absent.  Gait and Station - deferred.   ASSESSMENT/PLAN Ms. Catherine Greene is a 77 y.o. female with history of CAD, DM, HTN, HLD, hx of PE on eliquis, left ICA chronic occlusion, right CEA, and strokes with BLE weakness walking with cane at baseline admitted for left sided weakness and right gaze. No tPA given due to on eliquis.    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 Decreased flow to the left ICA terminus, left ACA, and left MCA vessels  DSA - Occluded  Lt ICA prox, with  modest reconstitution of Lt ICA cavernous seg and Lt MCA and ACA from Lt ECA branches via  ophthalmic  Artery  2D Echo  EF 65-70%  LDL 107  HgbA1c 5.7  Heparin subq for VTE prophylaxis  NPO for now  aspirin 81 mg daily and Eliquis (apixaban) daily prior to admission, now on ASA 81mg  and eliquis 5mg  bid.  Patient counseled to be compliant with her antithrombotic medications  Ongoing aggressive stroke risk factor management  Therapy recommendations:  pending  Disposition:  Pending  Hx of PE on eliquis  As per med list, she is on eliquis 5mg  bid and ASA 81 at home  no PAF documented afib in pt note  Called her PCP Dr. Rosana Hoes in [facility] but not available for phone call   Pt stated that she is on eliquis due to hx of PE  Resumed eliquis 5mg  bid - given current embolic pattern stroke, will continue eliquis  Carotid stenosis/occlusion  Right ICA CEA in the past  Left ICA chronic occlusion with decreased from left tICA, MCA and ACA  On ASA 81 and eliquis at home  Now resumed ASA 21  Continue eliquis   Diabetes  HgbA1c 5.7 goal < 7.0  Controlled  Home meds - glipizide and metformin  CBG monitoring  SSI  DM education and close PCP follow up  Hypertension . Stable off cleviprex . Resumed norvasc and metoprolol  Long term BP goal 130-160 given left ICA occlusion with decreased flow at left MCA, ACA, tICA  Hyperlipidemia  Home meds:  pravastatin 40  LDL 107, goal < 70  Now on pravastatin 80  Continue statin at discharge  Other Stroke Risk Factors  Advanced age  Coronary artery disease  Other Active Problems  Elevated TG  Hospital day # 2  This patient is critically ill due to right MCA infarct, PAF, DM and at significant risk of neurological worsening, death form recurrent stroke, hemorrhagic conversion, heart failure, seizure, DKA. This patient's care requires constant monitoring of vital signs, hemodynamics, respiratory and cardiac  monitoring, review of multiple databases, neurological assessment, discussion with family, other specialists and medical decision making of high complexity. I spent 40 minutes of neurocritical care time in the care of this patient. I had long discussion with pt and son at bedside, updated pt current condition, treatment plan and potential prognosis. They expressed understanding and appreciation.    Rosalin Hawking, MD PhD Stroke Neurology 12/30/2017 12:15 PM      To contact Stroke Continuity provider, please refer to [email or web address] After hours, contact General Neurology

## 2017-12-31 DIAGNOSIS — I6522 Occlusion and stenosis of left carotid artery: Secondary | ICD-10-CM

## 2017-12-31 LAB — GLUCOSE, CAPILLARY
GLUCOSE-CAPILLARY: 133 mg/dL — AB (ref 70–99)
GLUCOSE-CAPILLARY: 145 mg/dL — AB (ref 70–99)
Glucose-Capillary: 134 mg/dL — ABNORMAL HIGH (ref 70–99)
Glucose-Capillary: 124 mg/dL — ABNORMAL HIGH (ref 70–99)

## 2017-12-31 LAB — BASIC METABOLIC PANEL
ANION GAP: 10 (ref 5–15)
BUN: 19 mg/dL (ref 8–23)
CALCIUM: 9.7 mg/dL (ref 8.9–10.3)
CO2: 29 mmol/L (ref 22–32)
Chloride: 101 mmol/L (ref 98–111)
Creatinine, Ser: 0.74 mg/dL (ref 0.44–1.00)
Glucose, Bld: 147 mg/dL — ABNORMAL HIGH (ref 70–99)
POTASSIUM: 3.7 mmol/L (ref 3.5–5.1)
Sodium: 140 mmol/L (ref 135–145)

## 2017-12-31 LAB — CBC
HEMATOCRIT: 39.9 % (ref 36.0–46.0)
Hemoglobin: 12.6 g/dL (ref 12.0–15.0)
MCH: 28 pg (ref 26.0–34.0)
MCHC: 31.6 g/dL (ref 30.0–36.0)
MCV: 88.7 fL (ref 80.0–100.0)
NRBC: 0 % (ref 0.0–0.2)
PLATELETS: 164 10*3/uL (ref 150–400)
RBC: 4.5 MIL/uL (ref 3.87–5.11)
RDW: 13.9 % (ref 11.5–15.5)
WBC: 8.9 10*3/uL (ref 4.0–10.5)

## 2017-12-31 MED ORDER — LOSARTAN POTASSIUM 50 MG PO TABS
100.0000 mg | ORAL_TABLET | Freq: Every day | ORAL | Status: DC
  Administered 2017-12-31 – 2018-01-03 (×4): 100 mg via ORAL
  Filled 2017-12-31 (×4): qty 2

## 2017-12-31 MED ORDER — SODIUM CHLORIDE 0.9% FLUSH: 3.0000 mL | INTRAVENOUS | Status: DC | PRN

## 2017-12-31 MED ORDER — SODIUM CHLORIDE 0.9% FLUSH
3.0000 mL | Freq: Two times a day (BID) | INTRAVENOUS | Status: DC
  Administered 2017-12-31 – 2018-01-03 (×5): 3 mL via INTRAVENOUS

## 2017-12-31 NOTE — Progress Notes (Signed)
STROKE TEAM PROGRESS NOTE   SUBJECTIVE (INTERVAL HISTORY) Her husband and daughter are at the bedside. She is lying in bed, complaining of back pain, neck pain and HA, continue to have chronic diarrhea, same as at home. On imodium PRN at home and here. Left arm weakness slowly improving. PT/OT recommend CIR.    OBJECTIVE Temp:  [97.9 F (36.6 C)-98.8 F (37.1 C)] 97.9 F (36.6 C) (10/12 1142) Pulse Rate:  [64-78] 67 (10/12 1142) Cardiac Rhythm: Normal sinus rhythm (10/12 0720) Resp:  [16-19] 16 (10/12 1142) BP: (150-174)/(52-69) 160/62 (10/12 1142) SpO2:  [90 %-97 %] 95 % (10/12 1142)  Recent Labs  Lab 12/30/17 1219 12/30/17 1547 12/30/17 2139 12/31/17 0641 12/31/17 1133  GLUCAP 159* 159* 97 133* 145*   Recent Labs  Lab 12/28/17 0827 12/28/17 0853 12/30/17 0337 12/31/17 0348  NA 139 141 142 140  K 4.4 3.9 3.9 3.7  CL 103 103 103 101  CO2 27  --  30 29  GLUCOSE 131* 138* 141* 147*  BUN 13 14 21 19   CREATININE 0.78 0.80 0.71 0.74  CALCIUM 9.3  --  9.1 9.7   Recent Labs  Lab 12/28/17 0827  AST 34  ALT 17  ALKPHOS 52  BILITOT 1.4*  PROT 6.6  ALBUMIN 3.9   Recent Labs  Lab 12/28/17 0827 12/28/17 0853 12/30/17 0337 12/31/17 0348  WBC 8.4  --  10.6* 8.9  NEUTROABS 6.2  --   --   --   HGB 13.8 13.6 11.6* 12.6  HCT 41.5 40.0 37.2 39.9  MCV 87.9  --  90.1 88.7  PLT 179  --  164 164   No results for input(s): CKTOTAL, CKMB, CKMBINDEX, TROPONINI in the last 168 hours. No results for input(s): LABPROT, INR in the last 72 hours. No results for input(s): COLORURINE, LABSPEC, [place], GLUCOSEU, HGBUR, BILIRUBINUR, KETONESUR, PROTEINUR, UROBILINOGEN, NITRITE, LEUKOCYTESUR in the last 72 hours.  Invalid input(s): APPERANCEUR     Component Value Date/Time   CHOL 205 (H) 12/29/2017 0455   TRIG 350 (H) 12/30/2017 0337   HDL 37 (L) 12/29/2017 0455   CHOLHDL 5.5 12/29/2017 0455   VLDL 61 (H) 12/29/2017 0455   LDLCALC 107 (H) 12/29/2017 0455   Lab Results   Component Value Date   HGBA1C 5.7 (H) 12/29/2017   No results found for: LABOPIA, [place], [place], [place], [place], [place]  Recent Labs  Lab 12/28/17 0827  ETH <10    I have personally reviewed the radiological images below and agree with the radiology interpretations.  Mr Brain Wo Contrast  Result Date: 12/28/2017 CLINICAL DATA:  Acute onset of right-sided weakness. Previous infarcts. Diabetes. Acute onset of symptoms beginning 10 hours ago. EXAM: MRI HEAD WITHOUT CONTRAST MRA HEAD WITHOUT CONTRAST TECHNIQUE: Multiplanar, multiecho pulse sequences of the brain and surrounding structures were obtained without intravenous contrast. Angiographic images of the head were obtained using MRA technique without contrast. COMPARISON:  CT head without contrast from the same day. Cerebral arteriogram from the same day. FINDINGS: MRI HEAD FINDINGS Brain: The diffusion-weighted images demonstrate areas of acute cortical infarction involving the right middle frontal gyrus and the precentral gyrus. There are areas of involvement in the right parietal lobe in occluding the right postcentral gyrus. Scattered foci of restricted diffusion are present in the right temporal and occipital lobe along a watershed distribution. A more remote right occipital pole infarct is present. No hemorrhage is present. T2 signal changes are present within the infarct areas. There is moderate diffuse  T2 signal change or bilaterally. Advanced atrophy is present. Dilated perivascular spaces are present in the corona radiata bilaterally. White matter changes extend into the brainstem. Vascular: Abnormal signal is present in the left internal carotid artery to the cavernous segment. This is consistent with the known occlusion. Right ICA flow and posterior circulation flow is present. Skull and upper cervical spine: The skull base is normal. The craniocervical junction is normal. There is slight retrolisthesis at C3-4 with disc disease  at C3-4 and C4-5. Sinuses/Orbits: The paranasal sinuses are clear. Bilateral mastoid effusions are present. No obstructing nasopharyngeal lesion is present. Globes and orbits are within normal limits. MRA HEAD FINDINGS Left ICA occlusion is noted. There is marked decreased signal in the reconstituted right IC left ICA terminus. Decreased signal extends into the left a 2 segment with significant signal loss in left MCA vessels. Right A1 scratched at the right internal carotid artery is within normal limits. There is a mild to moderate stenosis at the anterior genu. The right ICA terminus is normal. A1 and M1 segments are normal. There is some attenuation of distal MCA branch vessels without a significant proximal occlusion. The left vertebral artery is the dominant vessel. Left PICA origin is visualized. The right PICA is not visualized. Basilar artery is normal. Both posterior cerebral arteries originate from the basilar tip. PCA branch vessels are within normal limits. IMPRESSION: 1. Scattered foci of acute/subacute cortical infarcts involving the right frontal, parietal, temporal, and occipital lobes. These may be due to proximal emboli. Watershed infarcts are also considered. 2. More remote cortical infarct involves the right occipital pole. 3. Advanced white matter disease reflects underlying microvascular ischemia. 4. 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 Decreased flow to the left ICA terminus, left ACA, and left MCA vessels. 5. Mild to moderate narrowing at the genu of the cavernous right internal carotid artery. 6. No other significant proximal stenosis on the right to account for the areas of infarction. No emergent large vessel occlusion on the right. 7. The posterior circulation is unremarkable. Electronically Signed   By: San Morelle M.D.   On: 12/28/2017 16:48   Dg Chest Port 1 View  Result Date: 12/28/2017 CLINICAL DATA:  Acute  ischemic stroke. EXAM: PORTABLE CHEST 1 VIEW COMPARISON:  09/04/2017 FINDINGS: Shallow inspiration. Heart size and pulmonary vascularity are normal. Minimal linear atelectasis in the lung bases. No focal consolidation or edema. No blunting of costophrenic angles. No pneumothorax. Mediastinal contours appear intact. Surgical clips in the base of the neck. Degenerative changes in the spine and shoulders. Old rib fractures. IMPRESSION: Shallow inspiration with minimal linear atelectasis in the lung bases. No focal consolidation or edema. Electronically Signed   By: Lucienne Capers M.D.   On: 12/28/2017 21:16   Mr Jodene Nam Head Wo Contrast  Result Date: 12/28/2017 CLINICAL DATA:  Acute onset of right-sided weakness. Previous infarcts. Diabetes. Acute onset of symptoms beginning 10 hours ago. EXAM: MRI HEAD WITHOUT CONTRAST MRA HEAD WITHOUT CONTRAST TECHNIQUE: Multiplanar, multiecho pulse sequences of the brain and surrounding structures were obtained without intravenous contrast. Angiographic images of the head were obtained using MRA technique without contrast. COMPARISON:  CT head without contrast from the same day. Cerebral arteriogram from the same day. FINDINGS: MRI HEAD FINDINGS Brain: The diffusion-weighted images demonstrate areas of acute cortical infarction involving the right middle frontal gyrus and the precentral gyrus. There are areas of involvement in the right parietal lobe in occluding the right postcentral  gyrus. Scattered foci of restricted diffusion are present in the right temporal and occipital lobe along a watershed distribution. A more remote right occipital pole infarct is present. No hemorrhage is present. T2 signal changes are present within the infarct areas. There is moderate diffuse T2 signal change or bilaterally. Advanced atrophy is present. Dilated perivascular spaces are present in the corona radiata bilaterally. White matter changes extend into the brainstem. Vascular: Abnormal signal  is present in the left internal carotid artery to the cavernous segment. This is consistent with the known occlusion. Right ICA flow and posterior circulation flow is present. Skull and upper cervical spine: The skull base is normal. The craniocervical junction is normal. There is slight retrolisthesis at C3-4 with disc disease at C3-4 and C4-5. Sinuses/Orbits: The paranasal sinuses are clear. Bilateral mastoid effusions are present. No obstructing nasopharyngeal lesion is present. Globes and orbits are within normal limits. MRA HEAD FINDINGS Left ICA occlusion is noted. There is marked decreased signal in the reconstituted right IC left ICA terminus. Decreased signal extends into the left a 2 segment with significant signal loss in left MCA vessels. Right A1 scratched at the right internal carotid artery is within normal limits. There is a mild to moderate stenosis at the anterior genu. The right ICA terminus is normal. A1 and M1 segments are normal. There is some attenuation of distal MCA branch vessels without a significant proximal occlusion. The left vertebral artery is the dominant vessel. Left PICA origin is visualized. The right PICA is not visualized. Basilar artery is normal. Both posterior cerebral arteries originate from the basilar tip. PCA branch vessels are within normal limits. IMPRESSION: 1. Scattered foci of acute/subacute cortical infarcts involving the right frontal, parietal, temporal, and occipital lobes. These may be due to proximal emboli. Watershed infarcts are also considered. 2. More remote cortical infarct involves the right occipital pole. 3. Advanced white matter disease reflects underlying microvascular ischemia. 4. 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 Decreased flow to the left ICA terminus, left ACA, and left MCA vessels. 5. Mild to moderate narrowing at the genu of the cavernous right internal carotid artery. 6. No other  significant proximal stenosis on the right to account for the areas of infarction. No emergent large vessel occlusion on the right. 7. The posterior circulation is unremarkable. Electronically Signed   By: San Morelle M.D.   On: 12/28/2017 16:48   Ct Head Code Stroke Wo Contrast  Result Date: 12/28/2017 CLINICAL DATA:  Code stroke. Acute onset of left-sided weakness and right-sided gaze. Focal neuro deficit of less than 6 hours. EXAM: CT HEAD WITHOUT CONTRAST TECHNIQUE: Contiguous axial images were obtained from the base of the skull through the vertex without intravenous contrast. COMPARISON:  CT head without contrast 10/21/2009. FINDINGS: Brain: Mild atrophy and moderate white matter disease has progressed since the prior exam. No acute anterior circulation infarct is present. Asymmetric ischemic changes of the right basal ganglia are stable. A new right occipital pole infarct is present. This is age indeterminate. No acute hemorrhage or mass lesion is present. The brainstem and cerebellum are normal. Ventricles are proportionate to the degree of atrophy. No significant extra-axial fluid collection is present. Vascular: Atherosclerotic calcifications are present in the cavernous internal carotid arteries. No significant asymmetric hyperdensity is evident. Skull: Calvarium is intact. No focal lytic or blastic lesions are present. No significant extracranial soft tissue lesion is present. Sinuses/Orbits: The paranasal sinuses and mastoid air cells are clear.  ASPECTS [facility] Stroke Program Early CT Score) - Ganglionic level infarction (caudate, lentiform nuclei, internal capsule, insula, M1-M3 cortex): 7/7 - Supraganglionic infarction (M4-M6 cortex): 3/3 Total score (0-10 with 10 being normal): 10/10 IMPRESSION: 1. Right occipital lobe infarct is new since the prior exam. This is age indeterminate. Acute infarct is not excluded. 2. No acute anterior circulation infarct. 3. Stable remote ischemic changes  of the basal ganglia, right greater than left. 4. Progressive atrophy and white matter disease. This likely reflects the sequela of chronic microvascular ischemia. 5. Atherosclerotic changes without a hyperdense vessel. 6. ASPECTS is 10/10 These results were called by telephone at the time of interpretation on 12/28/2017 at 8:40am to Dr. Rory Percy, who verbally acknowledged these results. Electronically Signed   By: San Morelle M.D.   On: 12/28/2017 08:49   DSA RT CFA appeoach.Findings. 1.No occlusions,stenosis,or filling defects or dissections involving RT anterior circulation. 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 3.Occluded  Lt ICA prox, with modest reconstitution of Lt ICA cavernous seg and Lt MCA and ACA from Lt ECA branches via  ophthalmic  Artery.  TTE  - Left ventricle: The cavity size was normal. There was moderate   concentric hypertrophy. Systolic function was vigorous. The   estimated ejection fraction was in the range of 65% to 70%. Wall   motion was normal; there were no regional wall motion   abnormalities. There was an increased relative contribution of   atrial contraction to ventricular filling. Doppler parameters are   consistent with abnormal left ventricular relaxation (grade 1   diastolic dysfunction). Doppler parameters are consistent with   high ventricular filling pressure. - Aortic valve: Mild focal calcification involving the right   coronary cusp. - Mitral valve: Calcified annulus.   PHYSICAL EXAM  Temp:  [97.9 F (36.6 C)-98.8 F (37.1 C)] 97.9 F (36.6 C) (10/12 1142) Pulse Rate:  [64-78] 67 (10/12 1142) Resp:  [16-19] 16 (10/12 1142) BP: (150-174)/(52-69) 160/62 (10/12 1142) SpO2:  [90 %-97 %] 95 % (10/12 1142)  General - Well nourished, well developed, in no apparent distress.  Ophthalmologic - fundi not visualized due to noncooperation.  Cardiovascular - Regular rate and rhythm, not in afib.  Mental Status -  Level of arousal and orientation to  time, place, and person were intact. Language including expression, naming, repetition, comprehension was assessed and found intact.    Cranial Nerves II - XII - II - Visual field intact OU III, IV, VI - Extraocular movements intact.  V - Facial sensation intact bilaterally. VII - left nasolabial fold mild flattening. VIII - Hearing & vestibular intact bilaterally. X - Palate elevates symmetrically. XI - Chin turning & shoulder shrug intact bilaterally. XII - Tongue protrusion intact.  Motor Strength - The patient's strength was 3/5 LUE, 4+/5 RUE, LLE 4/5 proximal and 5/5 distal, RLE 4+/5 proximal and 5/5 distal.  Bulk was normal and fasciculations were absent.   Motor Tone - Muscle tone was assessed at the neck and appendages and was normal.  Reflexes - The patient's reflexes were symmetrical in all extremities and she had no pathological reflexes.  Sensory - Light touch, temperature/pinprick were assessed and were decreased on the left, about 50% of the right.     Coordination - The patient had normal movements in the right hands with no ataxia or dysmetria, not able to test left hand due to weakness.  Tremor was absent.  Gait and Station - deferred.   ASSESSMENT/PLAN Ms. Catherine Greene is a 77  y.o. female with history of CAD, DM, HTN, HLD, hx of PE on eliquis, left ICA chronic occlusion, right CEA, and strokes with BLE weakness walking with cane at baseline admitted for left sided weakness and right gaze. No tPA given due to on eliquis.    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 Decreased flow to the left ICA terminus, left ACA, and left MCA vessels  DSA - Occluded  Lt ICA prox, with modest reconstitution of Lt ICA cavernous seg and Lt MCA and ACA from Lt ECA branches via  ophthalmic  Artery  2D  Echo  EF 65-70%  LDL 107  HgbA1c 5.7  Heparin subq for VTE prophylaxis  NPO for now  aspirin 81 mg daily and Eliquis (apixaban) daily prior to admission, now on ASA 81mg  and eliquis 5mg  bid.  Patient counseled to be compliant with her antithrombotic medications  Ongoing aggressive stroke risk factor management  Therapy recommendations:  CIR  Disposition:  Pending  Hx of PE on eliquis  As per med list, she is on eliquis 5mg  bid and ASA 81 at home  no PAF documented afib in pt note  Pt stated that she is on eliquis due to hx of PE  Resumed eliquis 5mg  bid - given current embolic pattern stroke, will continue eliquis  Carotid stenosis/occlusion  Right ICA CEA in the past  Left ICA chronic occlusion with decreased from left tICA, MCA and ACA  On ASA 81 and eliquis at home  Now resumed ASA 44  Continue eliquis   Diabetes  HgbA1c 5.7 goal < 7.0  Controlled  Home meds - glipizide and metformin  Metformin resumed, glipizide on hold   No significant hyperglycemia  CBG monitoring  SSI  close PCP follow up  Hypertension . Stable on the high end . Resumed norvasc, losartan and metoprolol  Long term BP goal 130-160 given left ICA occlusion with decreased flow at left MCA, ACA, tICA  Hyperlipidemia  Home meds:  pravastatin 40  LDL 107, goal < 70  Now on pravastatin 80  Continue statin at discharge  Other Stroke Risk Factors  Advanced age  Coronary artery disease  Other Active Problems  Elevated TG  Chronic diarrhea - PRN home lomotil - d/c laxatives   Chronic LBP and neck pain - tylenol PRN  Chronic HA - likely due to neck pain - fioricet PRN  Hospital day # 3   Rosalin Hawking, MD PhD Stroke Neurology 12/31/2017 12:08 PM      To contact Stroke Continuity provider, please refer to [email or web address] After hours, contact General Neurology

## 2017-12-31 NOTE — Progress Notes (Addendum)
Physical Therapy Treatment Patient Details Name: Catherine Greene MRN: [id number] DOB: 07-Jul-1940 Today's Date: 12/31/2017    History of Present Illness Pt is a 77 y/o female with pmh significant for CAD, COPD, DM, 2 strokes with residual weakness in both LE's, admitted with worsening left-sided weakness and right gaze preference.  MRI showed scattered infarcts involving the R fontal, parietal, temporal and occipital lobes.    PT Comments    Pt making steady progress with functional mobility. She is now able to perform bed mobility with min guard and transfers with min A. Continue to recommend CIR as pt demonstrates good potential to make some really great progress prior to d/c'ing home with family. PT will continue to follow acutely.   Follow Up Recommendations  CIR     Equipment Recommendations  None recommended by PT    Recommendations for Other Services       Precautions / Restrictions Precautions Precautions: Fall Restrictions Weight Bearing Restrictions: No    Mobility  Bed Mobility Overal bed mobility: Needs Assistance Bed Mobility: Supine to Sit     Supine to sit: Min guard     General bed mobility comments: greatly increased time and effort, use of bed rails, min guard for safety  Transfers Overall transfer level: Needs assistance Equipment used: Rolling walker (2 wheeled) Transfers: Sit to/from [facility] Sit to Stand: Min assist Stand pivot transfers: Min assist       General transfer comment: increased time, cueing for safe hand placement and technique, assist for stability with rise into standing and with pivotal movements to chair (towards her R side)  Ambulation/Gait                 Stairs             Wheelchair Mobility    Modified Rankin (Stroke Patients Only) Modified Rankin (Stroke Patients Only) Pre-Morbid Rankin Score: Slight disability Modified Rankin: Moderately severe disability     Balance Overall  balance assessment: Needs assistance Sitting-balance support: Feet supported Sitting balance-Leahy Scale: Good     Standing balance support: Bilateral upper extremity supported Standing balance-Leahy Scale: Poor                              Cognition Arousal/Alertness: Awake/alert Behavior During Therapy: WFL for tasks assessed/performed Overall Cognitive Status: Within Functional Limits for tasks assessed                                        Exercises      General Comments        Pertinent Vitals/Pain Pain Assessment: Faces Faces Pain Scale: Hurts little more Pain Location: back Pain Descriptors / Indicators: Sore Pain Intervention(s): Monitored during session;Repositioned    Home Living                      Prior Function            PT Goals (current goals can now be found in the care plan section) Acute Rehab PT Goals PT Goal Formulation: With patient Time For Goal Achievement: 01/13/18 Potential to Achieve Goals: Good Progress towards PT goals: Progressing toward goals    Frequency    Min 4X/week      PT Plan Current plan remains appropriate    Co-evaluation  AM-PAC PT "6 Clicks" Daily Activity  Outcome Measure  Difficulty turning over in bed (including adjusting bedclothes, sheets and blankets)?: A Little Difficulty moving from lying on back to sitting on the side of the bed? : Unable Difficulty sitting down on and standing up from a chair with arms (e.g., wheelchair, bedside commode, etc,.)?: Unable Help needed moving to and from a bed to chair (including a wheelchair)?: A Little Help needed walking in hospital room?: A Little Help needed climbing 3-5 steps with a railing? : A Lot 6 Click Score: 13    End of Session Equipment Utilized During Treatment: Gait belt Activity Tolerance: Patient tolerated treatment well Patient left: in chair;with call bell/phone within reach;with  family/visitor present Nurse Communication: Mobility status PT Visit Diagnosis: Unsteadiness on feet (R26.81);Other abnormalities of gait and mobility (R26.89);Hemiplegia and hemiparesis Hemiplegia - Right/Left: Left Hemiplegia - dominant/non-dominant: Non-dominant Hemiplegia - caused by: Cerebral infarction     Time: 0938-1829 PT Time Calculation (min) (ACUTE ONLY): 16 min  Charges:  $Therapeutic Activity: 8-22 mins                     Sherie Don, PT, DPT  Acute Rehabilitation Services Pager [phone] Office [place] 12/31/2017, 4:39 PM

## 2018-01-01 DIAGNOSIS — I639 Cerebral infarction, unspecified: Secondary | ICD-10-CM

## 2018-01-01 DIAGNOSIS — I10 Essential (primary) hypertension: Secondary | ICD-10-CM

## 2018-01-01 DIAGNOSIS — I63512 Cerebral infarction due to unspecified occlusion or stenosis of left middle cerebral artery: Secondary | ICD-10-CM

## 2018-01-01 DIAGNOSIS — R414 Neurologic neglect syndrome: Secondary | ICD-10-CM

## 2018-01-01 DIAGNOSIS — G8114 Spastic hemiplegia affecting left nondominant side: Secondary | ICD-10-CM

## 2018-01-01 LAB — CBC
HCT: 37.7 % (ref 36.0–46.0)
Hemoglobin: 12.1 g/dL (ref 12.0–15.0)
MCH: 28.2 pg (ref 26.0–34.0)
MCHC: 32.1 g/dL (ref 30.0–36.0)
MCV: 87.9 fL (ref 80.0–100.0)
Platelets: 155 10*3/uL (ref 150–400)
RBC: 4.29 MIL/uL (ref 3.87–5.11)
RDW: 13.6 % (ref 11.5–15.5)
WBC: 9.8 10*3/uL (ref 4.0–10.5)
nRBC: 0 % (ref 0.0–0.2)

## 2018-01-01 LAB — BASIC METABOLIC PANEL
Anion gap: 8 (ref 5–15)
BUN: 16 mg/dL (ref 8–23)
CO2: 30 mmol/L (ref 22–32)
Calcium: 9.4 mg/dL (ref 8.9–10.3)
Chloride: 101 mmol/L (ref 98–111)
Creatinine, Ser: 0.76 mg/dL (ref 0.44–1.00)
GFR calc non Af Amer: >60 mL/min (ref >60)
Glucose, Bld: 151 mg/dL — ABNORMAL HIGH (ref 70–99)
POTASSIUM: 3.5 mmol/L (ref 3.5–5.1)
SODIUM: 139 mmol/L (ref 135–145)

## 2018-01-01 LAB — GLUCOSE, CAPILLARY
GLUCOSE-CAPILLARY: 135 mg/dL — AB (ref 70–99)
GLUCOSE-CAPILLARY: 102 mg/dL — AB (ref 70–99)
Glucose-Capillary: 136 mg/dL — ABNORMAL HIGH (ref 70–99)

## 2018-01-01 NOTE — Consult Note (Signed)
Physical Medicine and Rehabilitation Consult Reason for Consult:Stroke rehab Referring Phsyician: Chelisa Hennen is an 77 y.o. female.   HPI: Patient with prior history of coronary artery disease, COPD, diabetes type 2, hypertension and hyperlipidemia as well as atrial fibrillation (although last EKG  Did not demonstrate this) and carotid stenosis admitted on 12/28/2017 with complaints of left-sided weakness.  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  [place] versus proximal emboli suspected.  He had occluded left ICA decreased flow to left ACA and MCA distribution  Not a TPA candidate because of Eliquis.  Taken for endovascular thrombectomy Echo demonstrated normal ejection fraction, moderate left ventricular concentric hypertrophy, grade 1 diastolic dysfunction mild calcification of the aortic and mitral valve but no significant stenosis  Seen by physical therapy on 12/31/2017 min guard with bed mobility transfers with min assist, no ambulation thus far recorded Speech therapy evaluation mild cognitive impairment decreased respiratory support, baseline mild memory impairment noted per daughter although worsened since most recent CVA, some left neglect was noted as well. Review of Systems -H ENT, notes poor vision to the left, cardiac denies chest pain shortness of breath, GI denies nausea vomiting diarrhea constipation, GU denies dysuria or frequency, musculoskeletal denies any pain with upper extremity or lower extremity motion no neck or back pain, skin no rash or itching, remainder of review is negative  Past Medical History:  Diagnosis Date  . CAD (coronary artery disease)   . Diabetes mellitus    type 2  . Hyperlipidemia   . Hypertension   . Past heart attack 1998   Past Surgical  History:  Procedure Laterality Date  . APPENDECTOMY    . BACK SURGERY     x 2  . CESAREAN SECTION     x 3  . IR ANGIO INTRA EXTRACRAN SEL COM CAROTID INNOMINATE BILAT MOD SED  12/28/2017  . IR ANGIO VERTEBRAL SEL SUBCLAVIAN INNOMINATE UNI R MOD SED  12/28/2017  . IR ANGIO VERTEBRAL SEL VERTEBRAL UNI L MOD SED  12/28/2017  . NECK SURGERY    . RADIOLOGY WITH ANESTHESIA N/A 12/28/2017   Procedure: IR WITH ANESTHESIA;  Surgeon: Luanne Bras, MD;  Location: [facility];  Service: Radiology;  Laterality: N/A;  . Skin cancer removal face    . TONSILLECTOMY     Family History  Problem Relation Age of Onset  . Bone cancer Mother 66       died  . Heart failure Father 64       died   Social History:  reports that she quit smoking about 21 years ago. Her smoking use included cigarettes. She has a 80.00 pack-year smoking history. She has never used smokeless tobacco. She reports that she does not drink alcohol or use drugs. Allergies:  Allergies  Allergen Reactions  . Codeine     REACTION: sickness  . Contrast Media [Iodinated Diagnostic Agents]   . Metrizamide Other (See Comments)    Upset stomach  . Morphine     REACTION: sickness   Medications Prior to Admission  Medication Sig Dispense Refill  . amLODipine (NORVASC) 10 MG tablet Take 10 mg by mouth daily.    . busPIRone (BUSPAR) 15 MG tablet Take 15 mg by mouth 2 (two) times daily.    . diphenoxylate-atropine (LOMOTIL) 2.5-0.025 MG tablet Take 1 tablet by  mouth 4 (four) times daily as needed for diarrhea or loose stools.     Marland Kitchen ELIQUIS 5 MG TABS tablet Take 5 mg by mouth 2 (two) times daily.     Marland Kitchen gabapentin (NEURONTIN) 400 MG capsule Take 400 mg by mouth every evening.     Marland Kitchen glipiZIDE (GLUCOTROL) 5 MG tablet Take 5 mg by mouth 2 (two) times daily before a meal.    . losartan (COZAAR) 100 MG tablet Take 100 mg by mouth daily.    . magnesium oxide (MAG-OX) 400 MG tablet Take 400 mg by mouth daily.     . metFORMIN (GLUCOPHAGE) 1000 MG  tablet Take 1,000 mg by mouth 2 (two) times daily with a meal.     . metoprolol tartrate (LOPRESSOR) 50 MG tablet Take 200 mg by mouth 2 (two) times daily.    . Omega-3 Fatty Acids (FISH OIL) 1000 MG CAPS Take 2,000 mg by mouth daily.     . pravastatin (PRAVACHOL) 40 MG tablet Take 40 mg by mouth daily.    . ranitidine (ZANTAC) 150 MG tablet Take 150 mg by mouth 2 (two) times daily.     . sertraline (ZOLOFT) 100 MG tablet Take 100 mg by mouth daily.      Home: Home Living Family/patient expects to be discharged to:: Private residence Living Arrangements: Spouse/significant other, Children Available Help at Discharge: Family, Available 24 hours/day Type of Home: Mobile home Home Access: Stairs to enter [employer] of Steps: several Entrance Stairs-Rails: Right, Left Home Layout: One level Bathroom Shower/Tub: Multimedia programmer: Standard Home Equipment: Radio producer - single point, Bedside commode  Lives With: Daughter, Significant other  Functional History: Prior Function Vocation: Retired Comments: pt did have a gait disturbance, but independent in the home with/without AD and cane use outdoors Functional Status:  Mobility:     Ambulation/Gait General Gait Details: step to pivotal steps in the RW to the chair    ADL: ADL Toilet Transfer Details (indicate cue type and reason): simulated transfer to chair   Cognition: Cognition Overall Cognitive Status: Impaired/Different from baseline Arousal/Alertness: Awake/alert Orientation Level: Oriented X4 Attention: Sustained, Selective Sustained Attention: Appears intact Selective Attention: Appears intact Memory: Impaired Memory Impairment: Decreased short term memory, Decreased recall of new information, Other (comment)(working memory) Decreased Short Term Memory: Verbal basic, Functional basic Awareness: Impaired Awareness Impairment: Emergent impairment, Anticipatory impairment Executive Function:  Reasoning, Sequencing Reasoning: Impaired Reasoning Impairment: Verbal basic Sequencing: Impaired Sequencing Impairment: Verbal complex Safety/Judgment: Impaired(decreased awareness of L visual field) Cognition Arousal/Alertness: Awake/alert Behavior During Therapy: WFL for tasks assessed/performed Overall Cognitive Status: Impaired/Different from baseline  Blood pressure (!) 175/69, pulse 87, temperature (!) 97.5 F (36.4 C), temperature source Oral, resp. rate 12, SpO2 96 %. Physical Exam  Results for orders placed or performed during the hospital encounter of 12/28/17 (from the past 24 hour(s))  Glucose, capillary     Status: Abnormal   Collection Time: 12/31/17  4:46 PM  Result Value Ref Range   Glucose-Capillary 134 (H) 70 - 99 mg/dL   Comment 1 Notify RN    Comment 2 Document in Chart   Glucose, capillary     Status: Abnormal   Collection Time: 12/31/17  9:43 PM  Result Value Ref Range   Glucose-Capillary 124 (H) 70 - 99 mg/dL   Comment 1 Notify RN    Comment 2 Document in Chart   CBC     Status: None   Collection Time: 01/01/18  5:55 AM  Result  Value Ref Range   WBC 9.8 4.0 - 10.5 K/uL   RBC 4.29 3.87 - 5.11 MIL/uL   Hemoglobin 12.1 12.0 - 15.0 g/dL   HCT 37.7 36.0 - 46.0 %   MCV 87.9 80.0 - 100.0 fL   MCH 28.2 26.0 - 34.0 pg   MCHC 32.1 30.0 - 36.0 g/dL   RDW 13.6 11.5 - 15.5 %   Platelets 155 150 - 400 K/uL   nRBC 0.0 0.0 - 0.2 %  Basic metabolic panel     Status: Abnormal   Collection Time: 01/01/18  5:55 AM  Result Value Ref Range   Sodium 139 135 - 145 mmol/L   Potassium 3.5 3.5 - 5.1 mmol/L   Chloride 101 98 - 111 mmol/L   CO2 30 22 - 32 mmol/L   Glucose, Bld 151 (H) 70 - 99 mg/dL   BUN 16 8 - 23 mg/dL   Creatinine, Ser 0.76 0.44 - 1.00 mg/dL   Calcium 9.4 8.9 - 10.3 mg/dL   GFR calc non Af Amer >60 >60 mL/min   GFR calc Af Amer >60 >60 mL/min   Anion gap 8 5 - 15  Glucose, capillary     Status: Abnormal   Collection Time: 01/01/18  6:06 AM   Result Value Ref Range   Glucose-Capillary 135 (H) 70 - 99 mg/dL   Comment 1 Notify RN    Comment 2 Document in Chart   Glucose, capillary     Status: Abnormal   Collection Time: 01/01/18 11:42 AM  Result Value Ref Range   Glucose-Capillary 102 (H) 70 - 99 mg/dL   Comment 1 Notify RN    Comment 2 Document in Chart    No results found.  General: No acute distress Mood and affect are appropriate Heart: Regular rate and rhythm no rubs murmurs or extra sounds Lungs: Clear to auscultation, breathing unlabored, no rales or wheezes Abdomen: Positive bowel sounds, soft nontender to palpation, nondistended Extremities: No clubbing, cyanosis, or edema Skin: No evidence of breakdown, no evidence of rash Neurologic: Cranial nerves II through XII intact, motor strength is 5/5 in RIght  deltoid, bicep, tricep, grip, hip flexor, knee extensors, ankle dorsiflexor and plantar flexor, 4/5 in the left deltoid, bicep, tricep, grip, hip flexion, knee extensor, ankle dorsi flexion plantar flexor Left homonymous hemianopsia Severe left visual neglect Sensory exam normal sensation to light touch and proprioception in bilateral upper and lower extremities Cerebellar exam normal finger to nose to finger as well as heel to shin in bilateral upper and lower extremities Musculoskeletal: Full range of motion in all 4 extremities. 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 Does the need for close, 24 hr/day medical supervision in concert with the patient's rehab needs make it unreasonable for this patient to be served in a less intensive setting? Yes 2. Co-Morbidities requiring supervision/potential complications: COPD, hypertension, diabetes type 2 3. Due to bladder management, bowel management, safety, skin/wound care, disease management, medication administration, pain management and patient education, does the  patient require 24 hr/day rehab nursing? Yes 4. Does the patient require coordinated care of a physician, rehab nurse, PT (1-2 hrs/day, 5 days/week), OT (1-2 hrs/day, 5 days/week) and SLP (.5-1 hrs/day, 5 days/week) to address physical and functional deficits in the context of the above medical diagnosis(es)? Yes Addressing deficits in the following areas: balance, endurance, locomotion, strength, transferring, bowel/bladder control, bathing, dressing, feeding, grooming, toileting, cognition and psychosocial support  5. Can the patient actively participate in an intensive therapy program of at least 3 hrs of therapy per day at least 5 days per week? Yes 6. The potential for patient to make measurable gains while on inpatient rehab is excellent 7. Anticipated functional outcomes upon discharge from inpatients are Sup PT, Sup OT, SupSLP 8. Estimated rehab length of stay to reach the above functional goals is: 7-10d 9. Does the patient have adequate social supports to accommodate these discharge functional goals? Yes 10. Anticipated D/C setting: Home 11. Anticipated post D/C treatments: [place] therapy 12. Overall Rehab/Functional Prognosis: excellent  RECOMMENDATIONS: This patient's condition is appropriate for continued rehabilitative care in the following setting: CIR Patient has agreed to participate in recommended program. Yes Note that insurance prior authorization may be required for reimbursement for recommended care.  Comment:   Charlett Blake 01/01/2018

## 2018-01-01 NOTE — Evaluation (Signed)
Speech Language Pathology Evaluation Patient Details Name: Catherine Greene MRN: [id number] DOB: 1940-07-31 Today's Date: 01/01/2018 Time: 1020-1046 SLP Time Calculation (min) (ACUTE ONLY): 26 min  Problem List:  Patient Active Problem List   Diagnosis Date Noted  . Acute ischemic stroke ([place]) 12/28/2017  . B12 deficiency 10/12/2016  . Procedure refused 10/12/2016  . Dysthymia 08/31/2016  . Controlled substance agreement signed 05/03/2016  . URI (upper respiratory infection) 05/03/2016  . Chronic right shoulder pain 02/02/2016  . Abscess of face 09/11/2015  . Nausea 09/11/2015  . Ischemic stroke ([place]) 08/30/2015  . Acute maxillary sinusitis 04/02/2015  . Chronic obstructive pulmonary disease ([place]) 03/04/2015  . Fatigue 02/20/2015  . Hypoxia 02/20/2015  . Left shoulder pain 10/21/2014  . Breast cancer screening, high risk patient 06/20/2014  . Polyneuropathy associated with underlying disease ([place]) 06/20/2014  . Back pain 11/15/2013  . Myalgia 11/15/2013  . Proteinuria 11/15/2013  . Anxiety 07/02/2013  . Colonoscopy refused 05/21/2013  . Diarrhea 05/21/2013  . Weight loss 05/21/2013  . 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 Occlusion and stenosis of carotid artery 07/04/2012  . Peripheral vascular disease ([place]) 07/04/2012  . Scoliosis (and kyphoscoliosis), idiopathic 07/04/2012  . Spondylolisthesis, congenital 07/04/2012  . Hyperlipidemia 07/04/2012  . Type II diabetes mellitus ([place]) 07/04/2012  . DM 03/13/2010  . HYPERLIPIDEMIA-MIXED 03/13/2010  . Essential hypertension 03/13/2010  . CAD (coronary artery disease), native coronary artery 03/13/2010   Past Medical History:  Past Medical History:  Diagnosis Date  . CAD (coronary artery disease)   . Diabetes mellitus    type 2  . Hyperlipidemia   . Hypertension   . Past heart attack 1998   Past Surgical History:  Past Surgical History:  Procedure Laterality Date  . APPENDECTOMY    . BACK SURGERY      x 2  . CESAREAN SECTION     x 3  . IR ANGIO INTRA EXTRACRAN SEL COM CAROTID INNOMINATE BILAT MOD SED  12/28/2017  . IR ANGIO VERTEBRAL SEL SUBCLAVIAN INNOMINATE UNI R MOD SED  12/28/2017  . IR ANGIO VERTEBRAL SEL VERTEBRAL UNI L MOD SED  12/28/2017  . NECK SURGERY    . RADIOLOGY WITH ANESTHESIA N/A 12/28/2017   Procedure: IR WITH ANESTHESIA;  Surgeon: Luanne Bras, MD;  Location: [facility];  Service: Radiology;  Laterality: N/A;  . Skin cancer removal face    . TONSILLECTOMY     HPI:  Pt is a 77 y/o female with pmh significant for CAD, COPD, DM, 2 strokes with residual weakness in both LE's, admitted with worsening left-sided weakness and right gaze preference.  MRI showed scattered infarcts involving the R fontal, parietal, temporal and occipital lobes.   Assessment / Plan / Recommendation Clinical Impression   Pt presents with mild cognitive communication impairment and decreased respiratory support for speech which manifests as low vocal intensity. She has mild memory impairments at baseline, however daughter reports these appear increased since CVA, and pt has been more repetitive in conversations. Daughter also reports having more difficulty hearing pt due to lower vocal intensity. Higher level attention, problem solving, and verbal reasoning are also impaired. She required mod verbal and visual cues for scanning to L to ID images, objects in her left visual field. When putting on her glasses, she initially neglected to extend the left earpiece, requiring mod cues for problem solving in order to place them correctly. I recommend skilled ST to address cognitive deficits and to improve communication; pt motivated for  improvement and is a good candidate for CIR. Will follow acutely.     SLP Assessment  SLP Recommendation/Assessment: Patient needs continued Speech Lanaguage Pathology Services SLP Visit Diagnosis: Dysarthria and anarthria (R47.1);Cognitive communication deficit (R41.841)     Follow Up Recommendations  Inpatient Rehab    Frequency and Duration min 2x/week  2 weeks      SLP Evaluation Cognition  Overall Cognitive Status: Impaired/Different from baseline Arousal/Alertness: Awake/alert Orientation Level: Oriented X4 Attention: Sustained;Selective Sustained Attention: Appears intact Selective Attention: Appears intact Memory: Impaired Memory Impairment: Decreased short term memory;Decreased recall of new information;Other (comment)(working memory) Decreased Short Term Memory: Verbal basic;Functional basic Awareness: Impaired Awareness Impairment: Emergent impairment;Anticipatory impairment Executive Function: Reasoning;Sequencing Reasoning: Impaired Reasoning Impairment: Verbal basic Sequencing: Impaired Sequencing Impairment: Verbal complex Safety/Judgment: Impaired(decreased awareness of L visual field)       Comprehension  Auditory Comprehension Overall Auditory Comprehension: Appears within functional limits for tasks assessed Yes/No Questions: Within Functional Limits Commands: Within Functional Limits Conversation: Simple Visual Recognition/Discrimination Discrimination: Within Function Limits Reading Comprehension Reading Status: Not tested    Expression Expression Primary Mode of Expression: Verbal Verbal Expression Overall Verbal Expression: Appears within functional limits for tasks assessed Initiation: No impairment Automatic Speech: Name;Social Response Level of Generative/Spontaneous Verbalization: Conversation Repetition: No impairment Naming: Impairment Divergent: 75-100% accurate(75%) Written Expression Dominant Hand: Right Written Expression: Not tested   Oral / Motor  Oral Motor/Sensory Function Overall Oral Motor/Sensory Function: Within functional limits Motor Speech Overall Motor Speech: Impaired Respiration: Impaired Level of Impairment: Conversation Phonation: Low vocal intensity Resonance: Within functional  limits Articulation: Within functional limitis Intelligibility: Intelligible Motor Planning: Witnin functional limits Effective Techniques: Increased vocal intensity   GO                   Deneise Lever, MS, [place] Pager: [phone] Office: [phone]  Aliene Altes 01/01/2018, 10:58 AM

## 2018-01-01 NOTE — Progress Notes (Signed)
STROKE TEAM PROGRESS NOTE   SUBJECTIVE (INTERVAL HISTORY) Her husband and daughter are at the bedside. She is lying in bed, complaining of back pain, neck pain and HA, continue to have chronic diarrhea, same as at home. On imodium PRN at home and here. Left arm weakness slowly improving. PT/OT recommend CIR.    OBJECTIVE Temp:  [97.5 F (36.4 C)-98.6 F (37 C)] 97.6 F (36.4 C) (10/13 1224) Pulse Rate:  [72-87] 75 (10/13 1224) Cardiac Rhythm: Normal sinus rhythm (10/13 0700) Resp:  [12-18] 14 (10/13 1224) BP: (130-192)/(51-73) 130/56 (10/13 1224) SpO2:  [96 %-100 %] 96 % (10/13 1224)  Recent Labs  Lab 12/31/17 1133 12/31/17 1646 12/31/17 2143 01/01/18 0606 01/01/18 1142  GLUCAP 145* 134* 124* 135* 102*   Recent Labs  Lab 12/28/17 0827 12/28/17 0853 12/30/17 0337 12/31/17 0348 01/01/18 0555  NA 139 141 142 140 139  K 4.4 3.9 3.9 3.7 3.5  CL 103 103 103 101 101  CO2 27  --  30 29 30   GLUCOSE 131* 138* 141* 147* 151*  BUN 13 14 21 19 16   CREATININE 0.78 0.80 0.71 0.74 0.76  CALCIUM 9.3  --  9.1 9.7 9.4   Recent Labs  Lab 12/28/17 0827  AST 34  ALT 17  ALKPHOS 52  BILITOT 1.4*  PROT 6.6  ALBUMIN 3.9   Recent Labs  Lab 12/28/17 0827 12/28/17 0853 12/30/17 0337 12/31/17 0348 01/01/18 0555  WBC 8.4  --  10.6* 8.9 9.8  NEUTROABS 6.2  --   --   --   --   HGB 13.8 13.6 11.6* 12.6 12.1  HCT 41.5 40.0 37.2 39.9 37.7  MCV 87.9  --  90.1 88.7 87.9  PLT 179  --  164 164 155   No results for input(s): CKTOTAL, CKMB, CKMBINDEX, TROPONINI in the last 168 hours. No results for input(s): LABPROT, INR in the last 72 hours. No results for input(s): COLORURINE, LABSPEC, [place], GLUCOSEU, HGBUR, BILIRUBINUR, KETONESUR, PROTEINUR, UROBILINOGEN, NITRITE, LEUKOCYTESUR in the last 72 hours.  Invalid input(s): APPERANCEUR     Component Value Date/Time   CHOL 205 (H) 12/29/2017 0455   TRIG 350 (H) 12/30/2017 0337   HDL 37 (L) 12/29/2017 0455   CHOLHDL 5.5 12/29/2017  0455   VLDL 61 (H) 12/29/2017 0455   LDLCALC 107 (H) 12/29/2017 0455   Lab Results  Component Value Date   HGBA1C 5.7 (H) 12/29/2017   No results found for: LABOPIA, [place], [place], [place], [place], [place]  Recent Labs  Lab 12/28/17 0827  ETH <10    I have personally reviewed the radiological images below and agree with the radiology interpretations.  Mr Brain Wo Contrast  Result Date: 12/28/2017 CLINICAL DATA:  Acute onset of right-sided weakness. Previous infarcts. Diabetes. Acute onset of symptoms beginning 10 hours ago. EXAM: MRI HEAD WITHOUT CONTRAST MRA HEAD WITHOUT CONTRAST TECHNIQUE: Multiplanar, multiecho pulse sequences of the brain and surrounding structures were obtained without intravenous contrast. Angiographic images of the head were obtained using MRA technique without contrast. COMPARISON:  CT head without contrast from the same day. Cerebral arteriogram from the same day. FINDINGS: MRI HEAD FINDINGS Brain: The diffusion-weighted images demonstrate areas of acute cortical infarction involving the right middle frontal gyrus and the precentral gyrus. There are areas of involvement in the right parietal lobe in occluding the right postcentral gyrus. Scattered foci of restricted diffusion are present in the right temporal and occipital lobe along a watershed distribution. A more remote right occipital pole  infarct is present. No hemorrhage is present. T2 signal changes are present within the infarct areas. There is moderate diffuse T2 signal change or bilaterally. Advanced atrophy is present. Dilated perivascular spaces are present in the corona radiata bilaterally. White matter changes extend into the brainstem. Vascular: Abnormal signal is present in the left internal carotid artery to the cavernous segment. This is consistent with the known occlusion. Right ICA flow and posterior circulation flow is present. Skull and upper cervical spine: The skull base is normal. The  craniocervical junction is normal. There is slight retrolisthesis at C3-4 with disc disease at C3-4 and C4-5. Sinuses/Orbits: The paranasal sinuses are clear. Bilateral mastoid effusions are present. No obstructing nasopharyngeal lesion is present. Globes and orbits are within normal limits. MRA HEAD FINDINGS Left ICA occlusion is noted. There is marked decreased signal in the reconstituted right IC left ICA terminus. Decreased signal extends into the left a 2 segment with significant signal loss in left MCA vessels. Right A1 scratched at the right internal carotid artery is within normal limits. There is a mild to moderate stenosis at the anterior genu. The right ICA terminus is normal. A1 and M1 segments are normal. There is some attenuation of distal MCA branch vessels without a significant proximal occlusion. The left vertebral artery is the dominant vessel. Left PICA origin is visualized. The right PICA is not visualized. Basilar artery is normal. Both posterior cerebral arteries originate from the basilar tip. PCA branch vessels are within normal limits. IMPRESSION: 1. Scattered foci of acute/subacute cortical infarcts involving the right frontal, parietal, temporal, and occipital lobes. These may be due to proximal emboli. Watershed infarcts are also considered. 2. More remote cortical infarct involves the right occipital pole. 3. Advanced white matter disease reflects underlying microvascular ischemia. 4. 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 Decreased flow to the left ICA terminus, left ACA, and left MCA vessels. 5. Mild to moderate narrowing at the genu of the cavernous right internal carotid artery. 6. No other significant proximal stenosis on the right to account for the areas of infarction. No emergent large vessel occlusion on the right. 7. The posterior circulation is unremarkable. Electronically Signed   By: San Morelle M.D.   On:  12/28/2017 16:48   Dg Chest Port 1 View  Result Date: 12/28/2017 CLINICAL DATA:  Acute ischemic stroke. EXAM: PORTABLE CHEST 1 VIEW COMPARISON:  09/04/2017 FINDINGS: Shallow inspiration. Heart size and pulmonary vascularity are normal. Minimal linear atelectasis in the lung bases. No focal consolidation or edema. No blunting of costophrenic angles. No pneumothorax. Mediastinal contours appear intact. Surgical clips in the base of the neck. Degenerative changes in the spine and shoulders. Old rib fractures. IMPRESSION: Shallow inspiration with minimal linear atelectasis in the lung bases. No focal consolidation or edema. Electronically Signed   By: Lucienne Capers M.D.   On: 12/28/2017 21:16   Mr Jodene Nam Head Wo Contrast  Result Date: 12/28/2017 CLINICAL DATA:  Acute onset of right-sided weakness. Previous infarcts. Diabetes. Acute onset of symptoms beginning 10 hours ago. EXAM: MRI HEAD WITHOUT CONTRAST MRA HEAD WITHOUT CONTRAST TECHNIQUE: Multiplanar, multiecho pulse sequences of the brain and surrounding structures were obtained without intravenous contrast. Angiographic images of the head were obtained using MRA technique without contrast. COMPARISON:  CT head without contrast from the same day. Cerebral arteriogram from the same day. FINDINGS: MRI HEAD FINDINGS Brain: The diffusion-weighted images demonstrate areas of acute cortical infarction involving the right middle frontal  gyrus and the precentral gyrus. There are areas of involvement in the right parietal lobe in occluding the right postcentral gyrus. Scattered foci of restricted diffusion are present in the right temporal and occipital lobe along a watershed distribution. A more remote right occipital pole infarct is present. No hemorrhage is present. T2 signal changes are present within the infarct areas. There is moderate diffuse T2 signal change or bilaterally. Advanced atrophy is present. Dilated perivascular spaces are present in the corona  radiata bilaterally. White matter changes extend into the brainstem. Vascular: Abnormal signal is present in the left internal carotid artery to the cavernous segment. This is consistent with the known occlusion. Right ICA flow and posterior circulation flow is present. Skull and upper cervical spine: The skull base is normal. The craniocervical junction is normal. There is slight retrolisthesis at C3-4 with disc disease at C3-4 and C4-5. Sinuses/Orbits: The paranasal sinuses are clear. Bilateral mastoid effusions are present. No obstructing nasopharyngeal lesion is present. Globes and orbits are within normal limits. MRA HEAD FINDINGS Left ICA occlusion is noted. There is marked decreased signal in the reconstituted right IC left ICA terminus. Decreased signal extends into the left a 2 segment with significant signal loss in left MCA vessels. Right A1 scratched at the right internal carotid artery is within normal limits. There is a mild to moderate stenosis at the anterior genu. The right ICA terminus is normal. A1 and M1 segments are normal. There is some attenuation of distal MCA branch vessels without a significant proximal occlusion. The left vertebral artery is the dominant vessel. Left PICA origin is visualized. The right PICA is not visualized. Basilar artery is normal. Both posterior cerebral arteries originate from the basilar tip. PCA branch vessels are within normal limits. IMPRESSION: 1. Scattered foci of acute/subacute cortical infarcts involving the right frontal, parietal, temporal, and occipital lobes. These may be due to proximal emboli. Watershed infarcts are also considered. 2. More remote cortical infarct involves the right occipital pole. 3. Advanced white matter disease reflects underlying microvascular ischemia. 4. 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 Decreased flow to the left ICA terminus, left ACA, and left MCA vessels. 5. Mild to  moderate narrowing at the genu of the cavernous right internal carotid artery. 6. No other significant proximal stenosis on the right to account for the areas of infarction. No emergent large vessel occlusion on the right. 7. The posterior circulation is unremarkable. Electronically Signed   By: San Morelle M.D.   On: 12/28/2017 16:48   Ct Head Code Stroke Wo Contrast  Result Date: 12/28/2017 CLINICAL DATA:  Code stroke. Acute onset of left-sided weakness and right-sided gaze. Focal neuro deficit of less than 6 hours. EXAM: CT HEAD WITHOUT CONTRAST TECHNIQUE: Contiguous axial images were obtained from the base of the skull through the vertex without intravenous contrast. COMPARISON:  CT head without contrast 10/21/2009. FINDINGS: Brain: Mild atrophy and moderate white matter disease has progressed since the prior exam. No acute anterior circulation infarct is present. Asymmetric ischemic changes of the right basal ganglia are stable. A new right occipital pole infarct is present. This is age indeterminate. No acute hemorrhage or mass lesion is present. The brainstem and cerebellum are normal. Ventricles are proportionate to the degree of atrophy. No significant extra-axial fluid collection is present. Vascular: Atherosclerotic calcifications are present in the cavernous internal carotid arteries. No significant asymmetric hyperdensity is evident. Skull: Calvarium is intact. No focal lytic or blastic lesions  are present. No significant extracranial soft tissue lesion is present. Sinuses/Orbits: The paranasal sinuses and mastoid air cells are clear. ASPECTS [facility] Stroke Program Early CT Score) - Ganglionic level infarction (caudate, lentiform nuclei, internal capsule, insula, M1-M3 cortex): 7/7 - Supraganglionic infarction (M4-M6 cortex): 3/3 Total score (0-10 with 10 being normal): 10/10 IMPRESSION: 1. Right occipital lobe infarct is new since the prior exam. This is age indeterminate. Acute infarct  is not excluded. 2. No acute anterior circulation infarct. 3. Stable remote ischemic changes of the basal ganglia, right greater than left. 4. Progressive atrophy and white matter disease. This likely reflects the sequela of chronic microvascular ischemia. 5. Atherosclerotic changes without a hyperdense vessel. 6. ASPECTS is 10/10 These results were called by telephone at the time of interpretation on 12/28/2017 at 8:40am to Dr. Rory Percy, who verbally acknowledged these results. Electronically Signed   By: San Morelle M.D.   On: 12/28/2017 08:49   DSA RT CFA appeoach.Findings. 1.No occlusions,stenosis,or filling defects or dissections involving RT anterior circulation. 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 3.Occluded  Lt ICA prox, with modest reconstitution of Lt ICA cavernous seg and Lt MCA and ACA from Lt ECA branches via  ophthalmic  Artery.  TTE  - Left ventricle: The cavity size was normal. There was moderate   concentric hypertrophy. Systolic function was vigorous. The   estimated ejection fraction was in the range of 65% to 70%. Wall   motion was normal; there were no regional wall motion   abnormalities. There was an increased relative contribution of   atrial contraction to ventricular filling. Doppler parameters are   consistent with abnormal left ventricular relaxation (grade 1   diastolic dysfunction). Doppler parameters are consistent with   high ventricular filling pressure. - Aortic valve: Mild focal calcification involving the right   coronary cusp. - Mitral valve: Calcified annulus.   PHYSICAL EXAM  Temp:  [97.5 F (36.4 C)-98.6 F (37 C)] 97.6 F (36.4 C) (10/13 1224) Pulse Rate:  [72-87] 75 (10/13 1224) Resp:  [12-18] 14 (10/13 1224) BP: (130-192)/(51-73) 130/56 (10/13 1224) SpO2:  [96 %-100 %] 96 % (10/13 1224)  General - Well nourished, well developed, in no apparent distress.  Ophthalmologic - fundi not visualized due to noncooperation.  Cardiovascular -  Regular rate and rhythm, not in afib.  Mental Status -  Level of arousal and orientation to time, place, and person were intact. Language including expression, naming, repetition, comprehension was assessed and found intact.    Cranial Nerves II - XII - II - Visual field intact OU III, IV, VI - Extraocular movements intact.  V - Facial sensation intact bilaterally. VII - left nasolabial fold mild flattening. VIII - Hearing & vestibular intact bilaterally. X - Palate elevates symmetrically. XI - Chin turning & shoulder shrug intact bilaterally. XII - Tongue protrusion intact.  Motor Strength - The patient's strength was 3/5 LUE, 4+/5 RUE, LLE 4/5 proximal and 5/5 distal, RLE 4+/5 proximal and 5/5 distal.  Bulk was normal and fasciculations were absent.   Motor Tone - Muscle tone was assessed at the neck and appendages and was normal.  Reflexes - The patient's reflexes were symmetrical in all extremities and she had no pathological reflexes.  Sensory - Light touch, temperature/pinprick were assessed and were decreased on the left, about 50% of the right.     Coordination - The patient had normal movements in the right hands with no ataxia or dysmetria, not able to test left hand due to  weakness.  Tremor was absent.  Gait and Station - deferred.   ASSESSMENT/PLAN Ms. Catherine Greene is a 77 y.o. female with history of CAD, DM, HTN, HLD, hx of PE on eliquis, left ICA chronic occlusion, right CEA, and strokes with BLE weakness walking with cane at baseline admitted for left sided weakness and right gaze. No tPA given due to on eliquis.    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 Decreased flow to the left ICA terminus, left ACA, and left MCA vessels  DSA - Occluded  Lt ICA prox, with modest reconstitution  of Lt ICA cavernous seg and Lt MCA and ACA from Lt ECA branches via  ophthalmic  Artery  2D Echo  EF 65-70%  LDL 107  HgbA1c 5.7  Heparin subq for VTE prophylaxis  NPO for now  aspirin 81 mg daily and Eliquis (apixaban) daily prior to admission, now on ASA 81mg  and eliquis 5mg  bid.  Patient counseled to be compliant with her antithrombotic medications  Ongoing aggressive stroke risk factor management  Therapy recommendations:  CIR  Disposition:  Pending  Hx of PE on eliquis  As per med list, she is on eliquis 5mg  bid and ASA 81 at home  no PAF documented afib in pt note  Pt stated that she is on eliquis due to hx of PE  Resumed eliquis 5mg  bid - given current embolic pattern stroke, will continue eliquis  Carotid stenosis/occlusion  Right ICA CEA in the past  Left ICA chronic occlusion with decreased from left tICA, MCA and ACA  On ASA 81 and eliquis at home  Now resumed ASA 66  Continue eliquis   Diabetes  HgbA1c 5.7 goal < 7.0  Controlled  Home meds - glipizide and metformin  Metformin resumed, glipizide on hold   No significant hyperglycemia  CBG monitoring  SSI  close PCP follow up  Hypertension . Stable on the high end . Resumed norvasc, losartan and metoprolol  Long term BP goal 130-160 given left ICA occlusion with decreased flow at left MCA, ACA, tICA  Hyperlipidemia  Home meds:  pravastatin 40  LDL 107, goal < 70  Now on pravastatin 80  Continue statin at discharge  Other Stroke Risk Factors  Advanced age  Coronary artery disease  Other Active Problems  Elevated TG  Chronic diarrhea - PRN home lomotil - d/c laxatives   Chronic LBP and neck pain - tylenol PRN  Chronic HA - likely due to neck pain - fioricet PRN  CBC and Bmet today normal except elevated glucose.  Hospital day # 4        To contact Stroke Continuity provider, please refer to [email or web address] After hours, contact General Neurology

## 2018-01-02 ENCOUNTER — Inpatient Hospital Stay (HOSPITAL_COMMUNITY): Payer: Medicare Other

## 2018-01-02 DIAGNOSIS — I639 Cerebral infarction, unspecified: Secondary | ICD-10-CM

## 2018-01-02 LAB — GLUCOSE, CAPILLARY
GLUCOSE-CAPILLARY: 140 mg/dL — AB (ref 70–99)
GLUCOSE-CAPILLARY: 82 mg/dL (ref 70–99)
Glucose-Capillary: 179 mg/dL — ABNORMAL HIGH (ref 70–99)
Glucose-Capillary: 152 mg/dL — ABNORMAL HIGH (ref 70–99)
Glucose-Capillary: 140 mg/dL — ABNORMAL HIGH (ref 70–99)

## 2018-01-02 NOTE — Care Management Important Message (Signed)
Important Message  Patient Details  Name: Catherine Greene MRN: [id number] Date of Birth: 1940-04-14   Medicare Important Message Given:  Yes    Barb Merino Lasharn Bufkin 01/02/2018, 2:35 PM

## 2018-01-02 NOTE — Progress Notes (Signed)
Physical Therapy Treatment Patient Details Name: Catherine Greene MRN: [id number] DOB: [birth year] Today's Date: 01/02/2018    History of Present Illness Pt is a 77 y/o female with pmh significant for CAD, COPD, DM, 2 strokes with residual weakness in both LE's, admitted with worsening left-sided weakness and right gaze preference.  MRI showed scattered infarcts involving the R fontal, parietal, temporal and occipital lobes.    PT Comments    Patient seen for activity progression. Tolerated increased physical activity with both disciplines today. Patient was able to perform UE and LE exercises prior to mobility and functional task performance. Ambulated with 2 person HHA. Patient continues to show good motivation and participation. Based on current functional status continue to feel CIR is most appropriate disposition. Will continue to see and progress as tolerated.   Follow Up Recommendations  CIR     Equipment Recommendations  None recommended by PT    Recommendations for Other Services Rehab consult     Precautions / Restrictions Precautions Precautions: Fall Restrictions Weight Bearing Restrictions: No    Mobility  Bed Mobility Overal bed mobility: Needs Assistance Bed Mobility: Rolling;Sit to Sidelying Rolling: Min guard       Sit to sidelying: Min assist General bed mobility comments: assist to elevate LEs back to bed  Transfers Overall transfer level: Needs assistance Equipment used: Rolling walker (2 wheeled) Transfers: Sit to/from [facility] Sit to Stand: Min assist Stand pivot transfers: Min assist       General transfer comment: Vcs for hand placement and postioing, min assist to power up to standing in increased time to power from varying surfaces  Ambulation/Gait Ambulation/Gait assistance: Mod assist;+2 physical assistance Gait Distance (Feet): 20 Feet Assistive device: 2 person hand held assist Gait Pattern/deviations: Step-through  pattern;Decreased stride length;Decreased weight shift to left;Trunk flexed;Drifts right/left     General Gait Details: patient with noted posterior instability and left sided fatigue during mobility.    Stairs             Wheelchair Mobility    Modified Rankin (Stroke Patients Only) Modified Rankin (Stroke Patients Only) Pre-Morbid Rankin Score: Slight disability Modified Rankin: Moderately severe disability     Balance Overall balance assessment: Needs assistance Sitting-balance support: Feet supported Sitting balance-Leahy Scale: Fair Sitting balance - Comments: needing external assist   Standing balance support: Bilateral upper extremity supported Standing balance-Leahy Scale: Poor Standing balance comment: reliant on external assist for stability due to posterior bias                            Cognition Arousal/Alertness: Awake/alert Behavior During Therapy: WFL for tasks assessed/performed Overall Cognitive Status: Impaired/Different from baseline Area of Impairment: Problem solving;Following commands                       Following Commands: Follows one step commands inconsistently;Follows multi-step commands inconsistently     Problem Solving: Requires verbal cues;Requires tactile cues        Exercises Other Exercises Other Exercises: Bilateral UE shoulder flexion/ elbow flexion extention Other Exercises: Bilateral LE LAQs and Ankle pumps    General Comments        Pertinent Vitals/Pain Pain Assessment: Faces Faces Pain Scale: Hurts even more Pain Location: back Pain Descriptors / Indicators: Aching Pain Intervention(s): Monitored during session    Home Living  Prior Function            PT Goals (current goals can now be found in the care plan section) Acute Rehab PT Goals Patient Stated Goal: to get off this bed pan PT Goal Formulation: With patient Time For Goal Achievement:  01/13/18 Potential to Achieve Goals: Good Progress towards PT goals: Progressing toward goals    Frequency    Min 4X/week      PT Plan Current plan remains appropriate    Co-evaluation PT/OT/SLP Co-Evaluation/Treatment: Yes Reason for Co-Treatment: For patient/therapist safety PT goals addressed during session: Mobility/safety with mobility OT goals addressed during session: ADL's and self-care      AM-PAC PT "6 Clicks" Daily Activity  Outcome Measure  Difficulty turning over in bed (including adjusting bedclothes, sheets and blankets)?: A Little Difficulty moving from lying on back to sitting on the side of the bed? : Unable Difficulty sitting down on and standing up from a chair with arms (e.g., wheelchair, bedside commode, etc,.)?: Unable Help needed moving to and from a bed to chair (including a wheelchair)?: A Little Help needed walking in hospital room?: A Little Help needed climbing 3-5 steps with a railing? : A Lot 6 Click Score: 13    End of Session Equipment Utilized During Treatment: Gait belt Activity Tolerance: Patient tolerated treatment well Patient left: in bed;with call bell/phone within reach;with bed alarm set;with family/visitor present Nurse Communication: Mobility status PT Visit Diagnosis: Unsteadiness on feet (R26.81);Other abnormalities of gait and mobility (R26.89);Hemiplegia and hemiparesis Hemiplegia - Right/Left: Left Hemiplegia - dominant/non-dominant: Non-dominant Hemiplegia - caused by: Cerebral infarction     Time: 5374-8270 PT Time Calculation (min) (ACUTE ONLY): 25 min  Charges:  $Therapeutic Activity: 8-22 mins                     Alben Deeds, PT DPT  Board Certified Neurologic Specialist Acute Rehabilitation Services Pager [phone] Office [place] 01/02/2018, 12:24 PM

## 2018-01-02 NOTE — Progress Notes (Signed)
Pt requested pain medication for chronic back pain. Due to pt being allergic to codeine and morphine, MD requested to give Extra Strength Tylenol.

## 2018-01-02 NOTE — Progress Notes (Signed)
TCD bubble study completed.  No HITS heard at rest. Positive HITS heard during valsalva. Trivial PFO. Clinically insignificant.    Landry Mellow RDMS RVT [place] Landry Mellow (RDMS RVT) 01/02/18 2:47 PM

## 2018-01-02 NOTE — Progress Notes (Signed)
STROKE TEAM PROGRESS NOTE   SUBJECTIVE (INTERVAL HISTORY) Her husband and daughter are at the bedside. She is lying in bed, no complaints. She had been compliant with her eliquis and not missed any doses  OBJECTIVE Temp:  [97.6 F (36.4 C)-98.5 F (36.9 C)] 98.5 F (36.9 C) (10/14 1335) Pulse Rate:  [69-93] 73 (10/14 1335) Cardiac Rhythm: Normal sinus rhythm (10/14 0705) Resp:  [15-18] 18 (10/14 1335) BP: (133-153)/(49-70) 138/51 (10/14 1335) SpO2:  [88 %-98 %] 96 % (10/14 1335)  Recent Labs  Lab 01/01/18 1142 01/01/18 1655 01/01/18 2110 01/02/18 0624 01/02/18 1119  GLUCAP 102* 136* 179* 140* 152*   Recent Labs  Lab 12/28/17 0827 12/28/17 0853 12/30/17 0337 12/31/17 0348 01/01/18 0555  NA 139 141 142 140 139  K 4.4 3.9 3.9 3.7 3.5  CL 103 103 103 101 101  CO2 27  --  30 29 30   GLUCOSE 131* 138* 141* 147* 151*  BUN 13 14 21 19 16   CREATININE 0.78 0.80 0.71 0.74 0.76  CALCIUM 9.3  --  9.1 9.7 9.4   Recent Labs  Lab 12/28/17 0827  AST 34  ALT 17  ALKPHOS 52  BILITOT 1.4*  PROT 6.6  ALBUMIN 3.9   Recent Labs  Lab 12/28/17 0827 12/28/17 0853 12/30/17 0337 12/31/17 0348 01/01/18 0555  WBC 8.4  --  10.6* 8.9 9.8  NEUTROABS 6.2  --   --   --   --   HGB 13.8 13.6 11.6* 12.6 12.1  HCT 41.5 40.0 37.2 39.9 37.7  MCV 87.9  --  90.1 88.7 87.9  PLT 179  --  164 164 155   No results for input(s): CKTOTAL, CKMB, CKMBINDEX, TROPONINI in the last 168 hours. No results for input(s): LABPROT, INR in the last 72 hours. No results for input(s): COLORURINE, LABSPEC, [place], GLUCOSEU, HGBUR, BILIRUBINUR, KETONESUR, PROTEINUR, UROBILINOGEN, NITRITE, LEUKOCYTESUR in the last 72 hours.  Invalid input(s): APPERANCEUR     Component Value Date/Time   CHOL 205 (H) 12/29/2017 0455   TRIG 350 (H) 12/30/2017 0337   HDL 37 (L) 12/29/2017 0455   CHOLHDL 5.5 12/29/2017 0455   VLDL 61 (H) 12/29/2017 0455   LDLCALC 107 (H) 12/29/2017 0455   Lab Results  Component Value Date    HGBA1C 5.7 (H) 12/29/2017   No results found for: LABOPIA, [place], [place], [place], [place], [place]  Recent Labs  Lab 12/28/17 0827  ETH <10    I have personally reviewed the radiological images below and agree with the radiology interpretations.  Catherine Greene Wo Contrast  Result Date: 12/28/2017 CLINICAL DATA:  Acute onset of right-sided weakness. Previous infarcts. Diabetes. Acute onset of symptoms beginning 10 hours ago. EXAM: MRI HEAD WITHOUT CONTRAST MRA HEAD WITHOUT CONTRAST TECHNIQUE: Multiplanar, multiecho pulse sequences of the Greene and surrounding structures were obtained without intravenous contrast. Angiographic images of the head were obtained using MRA technique without contrast. COMPARISON:  CT head without contrast from the same day. Cerebral arteriogram from the same day. FINDINGS: MRI HEAD FINDINGS Greene: The diffusion-weighted images demonstrate areas of acute cortical infarction involving the right middle frontal gyrus and the precentral gyrus. There are areas of involvement in the right parietal lobe in occluding the right postcentral gyrus. Scattered foci of restricted diffusion are present in the right temporal and occipital lobe along a watershed distribution. A more remote right occipital pole infarct is present. No hemorrhage is present. T2 signal changes are present within the infarct areas. There is moderate diffuse  T2 signal change or bilaterally. Advanced atrophy is present. Dilated perivascular spaces are present in the corona radiata bilaterally. White matter changes extend into the brainstem. Vascular: Abnormal signal is present in the left internal carotid artery to the cavernous segment. This is consistent with the known occlusion. Right ICA flow and posterior circulation flow is present. Skull and upper cervical spine: The skull base is normal. The craniocervical junction is normal. There is slight retrolisthesis at C3-4 with disc disease at C3-4 and C4-5.  Sinuses/Orbits: The paranasal sinuses are clear. Bilateral mastoid effusions are present. No obstructing nasopharyngeal lesion is present. Globes and orbits are within normal limits. MRA HEAD FINDINGS Left ICA occlusion is noted. There is marked decreased signal in the reconstituted right IC left ICA terminus. Decreased signal extends into the left a 2 segment with significant signal loss in left MCA vessels. Right A1 scratched at the right internal carotid artery is within normal limits. There is a mild to moderate stenosis at the anterior genu. The right ICA terminus is normal. A1 and M1 segments are normal. There is some attenuation of distal MCA branch vessels without a significant proximal occlusion. The left vertebral artery is the dominant vessel. Left PICA origin is visualized. The right PICA is not visualized. Basilar artery is normal. Both posterior cerebral arteries originate from the basilar tip. PCA branch vessels are within normal limits. IMPRESSION: 1. Scattered foci of acute/subacute cortical infarcts involving the right frontal, parietal, temporal, and occipital lobes. These may be due to proximal emboli. Watershed infarcts are also considered. 2. More remote cortical infarct involves the right occipital pole. 3. Advanced white matter disease reflects underlying microvascular ischemia. 4. 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 Decreased flow to the left ICA terminus, left ACA, and left MCA vessels. 5. Mild to moderate narrowing at the genu of the cavernous right internal carotid artery. 6. No other significant proximal stenosis on the right to account for the areas of infarction. No emergent large vessel occlusion on the right. 7. The posterior circulation is unremarkable. Electronically Signed   By: San Morelle M.D.   On: 12/28/2017 16:48   Dg Chest Port 1 View  Result Date: 12/28/2017 CLINICAL DATA:  Acute ischemic stroke. EXAM:  PORTABLE CHEST 1 VIEW COMPARISON:  09/04/2017 FINDINGS: Shallow inspiration. Heart size and pulmonary vascularity are normal. Minimal linear atelectasis in the lung bases. No focal consolidation or edema. No blunting of costophrenic angles. No pneumothorax. Mediastinal contours appear intact. Surgical clips in the base of the neck. Degenerative changes in the spine and shoulders. Old rib fractures. IMPRESSION: Shallow inspiration with minimal linear atelectasis in the lung bases. No focal consolidation or edema. Electronically Signed   By: Lucienne Capers M.D.   On: 12/28/2017 21:16   Catherine Jodene Nam Head Wo Contrast  Result Date: 12/28/2017 CLINICAL DATA:  Acute onset of right-sided weakness. Previous infarcts. Diabetes. Acute onset of symptoms beginning 10 hours ago. EXAM: MRI HEAD WITHOUT CONTRAST MRA HEAD WITHOUT CONTRAST TECHNIQUE: Multiplanar, multiecho pulse sequences of the Greene and surrounding structures were obtained without intravenous contrast. Angiographic images of the head were obtained using MRA technique without contrast. COMPARISON:  CT head without contrast from the same day. Cerebral arteriogram from the same day. FINDINGS: MRI HEAD FINDINGS Greene: The diffusion-weighted images demonstrate areas of acute cortical infarction involving the right middle frontal gyrus and the precentral gyrus. There are areas of involvement in the right parietal lobe in occluding the right postcentral  gyrus. Scattered foci of restricted diffusion are present in the right temporal and occipital lobe along a watershed distribution. A more remote right occipital pole infarct is present. No hemorrhage is present. T2 signal changes are present within the infarct areas. There is moderate diffuse T2 signal change or bilaterally. Advanced atrophy is present. Dilated perivascular spaces are present in the corona radiata bilaterally. White matter changes extend into the brainstem. Vascular: Abnormal signal is present in the left  internal carotid artery to the cavernous segment. This is consistent with the known occlusion. Right ICA flow and posterior circulation flow is present. Skull and upper cervical spine: The skull base is normal. The craniocervical junction is normal. There is slight retrolisthesis at C3-4 with disc disease at C3-4 and C4-5. Sinuses/Orbits: The paranasal sinuses are clear. Bilateral mastoid effusions are present. No obstructing nasopharyngeal lesion is present. Globes and orbits are within normal limits. MRA HEAD FINDINGS Left ICA occlusion is noted. There is marked decreased signal in the reconstituted right IC left ICA terminus. Decreased signal extends into the left a 2 segment with significant signal loss in left MCA vessels. Right A1 scratched at the right internal carotid artery is within normal limits. There is a mild to moderate stenosis at the anterior genu. The right ICA terminus is normal. A1 and M1 segments are normal. There is some attenuation of distal MCA branch vessels without a significant proximal occlusion. The left vertebral artery is the dominant vessel. Left PICA origin is visualized. The right PICA is not visualized. Basilar artery is normal. Both posterior cerebral arteries originate from the basilar tip. PCA branch vessels are within normal limits. IMPRESSION: 1. Scattered foci of acute/subacute cortical infarcts involving the right frontal, parietal, temporal, and occipital lobes. These may be due to proximal emboli. Watershed infarcts are also considered. 2. More remote cortical infarct involves the right occipital pole. 3. Advanced white matter disease reflects underlying microvascular ischemia. 4. 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 Decreased flow to the left ICA terminus, left ACA, and left MCA vessels. 5. Mild to moderate narrowing at the genu of the cavernous right internal carotid artery. 6. No other significant proximal  stenosis on the right to account for the areas of infarction. No emergent large vessel occlusion on the right. 7. The posterior circulation is unremarkable. Electronically Signed   By: San Morelle M.D.   On: 12/28/2017 16:48   Ct Head Code Stroke Wo Contrast  Result Date: 12/28/2017 CLINICAL DATA:  Code stroke. Acute onset of left-sided weakness and right-sided gaze. Focal neuro deficit of less than 6 hours. EXAM: CT HEAD WITHOUT CONTRAST TECHNIQUE: Contiguous axial images were obtained from the base of the skull through the vertex without intravenous contrast. COMPARISON:  CT head without contrast 10/21/2009. FINDINGS: Greene: Mild atrophy and moderate white matter disease has progressed since the prior exam. No acute anterior circulation infarct is present. Asymmetric ischemic changes of the right basal ganglia are stable. A new right occipital pole infarct is present. This is age indeterminate. No acute hemorrhage or mass lesion is present. The brainstem and cerebellum are normal. Ventricles are proportionate to the degree of atrophy. No significant extra-axial fluid collection is present. Vascular: Atherosclerotic calcifications are present in the cavernous internal carotid arteries. No significant asymmetric hyperdensity is evident. Skull: Calvarium is intact. No focal lytic or blastic lesions are present. No significant extracranial soft tissue lesion is present. Sinuses/Orbits: The paranasal sinuses and mastoid air cells are clear.  ASPECTS [facility] Stroke Program Early CT Score) - Ganglionic level infarction (caudate, lentiform nuclei, internal capsule, insula, M1-M3 cortex): 7/7 - Supraganglionic infarction (M4-M6 cortex): 3/3 Total score (0-10 with 10 being normal): 10/10 IMPRESSION: 1. Right occipital lobe infarct is new since the prior exam. This is age indeterminate. Acute infarct is not excluded. 2. No acute anterior circulation infarct. 3. Stable remote ischemic changes of the basal  ganglia, right greater than left. 4. Progressive atrophy and white matter disease. This likely reflects the sequela of chronic microvascular ischemia. 5. Atherosclerotic changes without a hyperdense vessel. 6. ASPECTS is 10/10 These results were called by telephone at the time of interpretation on 12/28/2017 at 8:40am to Dr. Rory Percy, who verbally acknowledged these results. Electronically Signed   By: San Morelle M.D.   On: 12/28/2017 08:49   DSA RT CFA appeoach.Findings. 1.No occlusions,stenosis,or filling defects or dissections involving RT anterior circulation. 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 3.Occluded  Lt ICA prox, with modest reconstitution of Lt ICA cavernous seg and Lt MCA and ACA from Lt ECA branches via  ophthalmic  Artery.  TTE  - Left ventricle: The cavity size was normal. There was moderate   concentric hypertrophy. Systolic function was vigorous. The   estimated ejection fraction was in the range of 65% to 70%. Wall   motion was normal; there were no regional wall motion   abnormalities. There was an increased relative contribution of   atrial contraction to ventricular filling. Doppler parameters are   consistent with abnormal left ventricular relaxation (grade 1   diastolic dysfunction). Doppler parameters are consistent with   high ventricular filling pressure. - Aortic valve: Mild focal calcification involving the right   coronary cusp. - Mitral valve: Calcified annulus.   PHYSICAL EXAM  Temp:  [97.6 F (36.4 C)-98.5 F (36.9 C)] 98.5 F (36.9 C) (10/14 1335) Pulse Rate:  [69-93] 73 (10/14 1335) Resp:  [15-18] 18 (10/14 1335) BP: (133-153)/(49-70) 138/51 (10/14 1335) SpO2:  [88 %-98 %] 96 % (10/14 1335)  General - frail elderly caucasian lady, in no apparent distress.  Ophthalmologic - fundi not visualized due to noncooperation.  Cardiovascular - Regular rate and rhythm, not in afib.  Mental Status -  Level of arousal and orientation to time, place, and  person were intact. Language including expression, naming, repetition, comprehension was assessed and found intact.    Cranial Nerves II - XII - II - Visual field intact OU III, IV, VI - Extraocular movements intact.  V - Facial sensation intact bilaterally. VII - left nasolabial fold mild flattening. VIII - Hearing & vestibular intact bilaterally. X - Palate elevates symmetrically. XI - Chin turning & shoulder shrug intact bilaterally. XII - Tongue protrusion intact.  Motor Strength - The patient's strength was 4/5 LUE with weak left grip, 4+/5 RUE, LLE 4/5 proximal and 5/5 distal, RLE 4+/5 proximal and 5/5 distal.  Bulk was normal and fasciculations were absent.   Motor Tone - Muscle tone was assessed at the neck and appendages and was normal.  Reflexes - The patient's reflexes were symmetrical in all extremities and she had no pathological reflexes.  Sensory - Light touch, temperature/pinprick were assessed and were decreased on the left, about 50% of the right.     Coordination - The patient had normal movements in the right hands with no ataxia or dysmetria, not able to test left hand due to weakness.  Tremor was absent.  Gait and Station - deferred.   ASSESSMENT/PLAN Ms. Catherine  Greene is a 77 y.o. female with history of CAD, DM, HTN, HLD, hx of PE on eliquis, left ICA chronic occlusion, right CEA, and strokes with BLE weakness walking with cane at baseline admitted for left sided weakness and right gaze. No tPA given due to on eliquis.    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 Decreased flow to the left ICA terminus, left ACA, and left MCA vessels  DSA - Occluded  Lt ICA prox, with modest reconstitution of Lt ICA cavernous seg and Lt MCA and ACA from Lt ECA branches via   ophthalmic  Artery  2D Echo  EF 65-70%  LDL 107  HgbA1c 5.7  Heparin subq for VTE prophylaxis  NPO for now  aspirin 81 mg daily and Eliquis (apixaban) daily prior to admission, now on ASA 81mg  and eliquis 5mg  bid.  Patient counseled to be compliant with her antithrombotic medications  Ongoing aggressive stroke risk factor management  Therapy recommendations:  CIR  Disposition:  Pending  Hx of PE on eliquis  As per med list, she is on eliquis 5mg  bid and ASA 81 at home  no PAF documented afib in pt note  Pt stated that she is on eliquis due to hx of PE  Resumed eliquis 5mg  bid - given current embolic pattern stroke, will continue eliquis  Carotid stenosis/occlusion  Right ICA CEA in the past  Left ICA chronic occlusion with decreased from left tICA, MCA and ACA  On ASA 81 and eliquis at home  Now resumed ASA 66  Continue eliquis   Diabetes  HgbA1c 5.7 goal < 7.0  Controlled  Home meds - glipizide and metformin  Metformin resumed, glipizide on hold   No significant hyperglycemia  CBG monitoring  SSI  close PCP follow up  Hypertension . Stable on the high end . Resumed norvasc, losartan and metoprolol  Long term BP goal 130-160 given left ICA occlusion with decreased flow at left MCA, ACA, tICA  Hyperlipidemia  Home meds:  pravastatin 40  LDL 107, goal < 70  Now on pravastatin 80  Continue statin at discharge  Other Stroke Risk Factors  Advanced age  Coronary artery disease  Other Active Problems  Elevated TG  Chronic diarrhea - PRN home lomotil - d/c laxatives   Chronic LBP and neck pain - tylenol PRN  Chronic HA - likely due to neck pain - fioricet PRN     Hospital day # 5 I have personally examined this patient, reviewed notes, independently viewed imaging studies, participated in medical decision making and plan of care.ROS completed by me personally and pertinent positives fully documented  I have made any additions or  clarifications directly to the above note. Plan check TCD bubble study to look for right-to-left shunt and paradoxical embolism. Long discussion with patient and family at the bedside and answered questions. Greater than 50% time during this 25 minute visit was spent on counseling and coordination of care about her embolic stroke and discussion about plan of care and answered questions. Antony Contras, MD Medical Director [facility] Pager: [phone] 01/02/2018 2:47 PM        To contact Stroke Continuity provider, please refer to [email or web address] After hours, contact General Neurology

## 2018-01-02 NOTE — Progress Notes (Signed)
Inpatient Rehabilitation Admissions Coordinator  I met with pt, her significant other, daughter and son at bedside. We discussed goals and expectations of an inpt rehab admit. They are in agreement. I will begin insurance authorization with [facility] for a possible admit pending insurance approval. I will follow up tomorrow.  Danne Baxter, RN, MSN Rehab Admissions Coordinator [phone] 01/02/2018 11:57 AM

## 2018-01-02 NOTE — Progress Notes (Signed)
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  MRI showed scattered infarcts involving the R fontal, parietal, temporal and occipital lobes.   OT comments  Pt progressing towards established OT goals and is very motivated to participate in therapy. Pt performing toileting with Mod A. Pt performing hand hygiene at the sink with Mod A for standing balance and Min A to facilitate movement of LUE. Pt presenting with decreased vision tracking to left and visual deficits. Continue to recommend dc to CIR and will continue to follow acutely as admitted.    Follow Up Recommendations  CIR    Equipment Recommendations  Other (comment)(Defer to next venue)    Recommendations for Other Services Other (comment)(Palliative consult)    Precautions / Restrictions Precautions Precautions: Fall Restrictions Weight Bearing Restrictions: No       Mobility Bed Mobility Overal bed mobility: Needs Assistance Bed Mobility: Rolling;Sit to Sidelying Rolling: Min guard       Sit to sidelying: Min assist General bed mobility comments: assist to elevate LEs back to bed  Transfers Overall transfer level: Needs assistance Equipment used: Rolling walker (2 wheeled) Transfers: Sit to/from Stand Sit to Stand: Min assist Stand pivot transfers: Min assist       General transfer comment: Vcs for hand placement and postioing, min assist to power up to standing in increased time to power from varying surfaces    Balance Overall balance assessment: Needs assistance Sitting-balance support: Feet supported Sitting balance-Leahy Scale: Fair Sitting balance - Comments: needing external assist   Standing balance support:  Bilateral upper extremity supported Standing balance-Leahy Scale: Poor Standing balance comment: reliant on external assist for stability due to posterior bias                           ADL either performed or assessed with clinical judgement   ADL Overall ADL's : Needs assistance/impaired     Grooming: Moderate assistance;Wash/dry hands;Standing Grooming Details (indicate cue type and reason): Pt performing hand hygiene at sink with Mod A for standing balance. Requiring Min A to faciltiate normalized movement of LUE when reaching (i.e. for soap or turn on faucet.)             Lower Body Dressing: Moderate assistance;Sit to/from stand Lower Body Dressing Details (indicate cue type and reason): Pt bending forward to pull up socks. using RUE only and not initating LUE towards socks. Mod A for standing balance Toilet Transfer: Minimal assistance;+2 for physical assistance;Ambulation;BSC Toilet Transfer Details (indicate cue type and reason): Min A +2 for power up into standing and then to guide to [facility]. Pt presenting with weakness and decreased balance Toileting- Clothing Manipulation and Hygiene: Moderate assistance;Sit to/from stand Toileting - Clothing Manipulation Details (indicate cue type and reason): Mod A for maintaining balance and then pt performing peri care     Functional mobility during ADLs: Moderate assistance;+2 for physical assistance General ADL Comments: Pt highly motivated. Continues to present with decreased balance, strength, vision, and functional use of LUE.     Vision   Vision Assessment?: Yes Eye Alignment: Impaired (comment)(with movement; left eye not tracking with right) Ocular Range of Motion: Impaired-to be further tested in functional context Alignment/Gaze Preference:  Head turned(Slight head turn to right throughout session) Tracking/Visual Pursuits: Requires cues, head turns, or add eye shifts to track;Unable to hold eye position out of  midline;Impaired - to be further tested in functional context(Not tracking laterally to left) Diplopia Assessment: Other (comment)(Pt denies double vision; however, closes right eye) Additional Comments: Pt with decreased scanning and tracking to left. Pt reporting "my left eye just ain't seeing things."   Perception     Praxis      Cognition Arousal/Alertness: Awake/alert Behavior During Therapy: WFL for tasks assessed/performed Overall Cognitive Status: Impaired/Different from baseline Area of Impairment: Problem solving;Following commands                       Following Commands: Follows one step commands inconsistently;Follows multi-step commands inconsistently     Problem Solving: Requires verbal cues;Requires tactile cues General Comments: Pt requiring increased time and cues during session. Very motivated to participate in therapy        Exercises Exercises: General Upper Extremity General Exercises - Upper Extremity Shoulder Flexion: AROM;Both;5 reps;Seated Elbow Flexion: AROM;Both;10 reps;Seated Elbow Extension: AROM;Both;10 reps;Seated Wrist Flexion: PROM;Left;10 reps;Seated Wrist Extension: PROM;Left;10 reps;Seated    Shoulder Instructions       General Comments Significant other and daughter arriving at end of session    Pertinent Vitals/ Pain       Pain Assessment: Faces Faces Pain Scale: Hurts even more Pain Location: back Pain Descriptors / Indicators: Aching Pain Intervention(s): Monitored during session;Limited activity within patient's tolerance;Repositioned  Home Living                                          Prior Functioning/Environment              Frequency  Min 3X/week        Progress Toward Goals  OT Goals(current goals can now be found in the care plan section)  Progress towards OT goals: Progressing toward goals  Acute Rehab OT Goals Patient Stated Goal: to get off this bed pan OT Goal  Formulation: With patient Time For Goal Achievement: 01/13/18 Potential to Achieve Goals: Good ADL Goals Pt Will Perform Grooming: with set-up;sitting Pt Will Perform Upper Body Bathing: with min assist;sitting Pt Will Transfer to Toilet: with mod assist;bedside commode;ambulating Pt/caregiver will Perform Home Exercise Program: Increased strength;Left upper extremity;With minimal assist;With written HEP provided Additional ADL Goal #1: Pt will complete bed mobility min (A) as precursor to adls  Plan Discharge plan remains appropriate    Co-evaluation    PT/OT/SLP Co-Evaluation/Treatment: Yes Reason for Co-Treatment: For patient/therapist safety;To address functional/ADL transfers PT goals addressed during session: Mobility/safety with mobility OT goals addressed during session: ADL's and self-care      AM-PAC PT "6 Clicks" Daily Activity     Outcome Measure   Help from another person eating meals?: A Little Help from another person taking care of personal grooming?: A Lot Help from another person toileting, which includes using toliet, bedpan, or urinal?: A Lot Help from another person bathing (including washing, rinsing, drying)?: A Lot Help from another person to put on and taking off regular upper body clothing?: A Little Help from another person to put on and taking off regular lower body clothing?: A Lot 6 Click Score: 14    End of Session Equipment Utilized During Treatment: Gait belt  OT Visit Diagnosis: Unsteadiness on feet (  R26.81);Muscle weakness (generalized) (M62.81);Hemiplegia and hemiparesis Hemiplegia - Right/Left: Left Hemiplegia - dominant/non-dominant: Non-Dominant Hemiplegia - caused by: Cerebral infarction   Activity Tolerance Patient tolerated treatment well   Patient Left in bed;with call bell/phone within reach;with family/visitor present   Nurse Communication Mobility status;Precautions        Time: 0964-3838 OT Time Calculation (min): 25  min  Charges: OT General Charges $OT Visit: 1 Visit OT Treatments $Self Care/Home Management : 8-22 mins  [place], OTR/L Acute Rehab Pager: [phone] Office: [place] 01/02/2018, 12:43 PM

## 2018-01-03 ENCOUNTER — Encounter (HOSPITAL_COMMUNITY): Payer: Self-pay | Admitting: Interventional Radiology

## 2018-01-03 ENCOUNTER — Inpatient Hospital Stay (HOSPITAL_COMMUNITY)
Admission: RE | Admit: 2018-01-03 | Discharge: 2018-01-19 | DRG: 057 | Disposition: A | Discharge status: Home Health | Payer: Medicare Other | Source: Intra-hospital | Attending: Physical Medicine & Rehabilitation | Admitting: Physical Medicine & Rehabilitation

## 2018-01-03 ENCOUNTER — Other Ambulatory Visit: Payer: Self-pay

## 2018-01-03 DIAGNOSIS — I2782 Chronic pulmonary embolism: Secondary | ICD-10-CM

## 2018-01-03 DIAGNOSIS — M75102 Unspecified rotator cuff tear or rupture of left shoulder, not specified as traumatic: Secondary | ICD-10-CM

## 2018-01-03 DIAGNOSIS — I252 Old myocardial infarction: Secondary | ICD-10-CM | POA: Diagnosis not present

## 2018-01-03 DIAGNOSIS — I6521 Occlusion and stenosis of right carotid artery: Secondary | ICD-10-CM

## 2018-01-03 DIAGNOSIS — F410 Panic disorder [episodic paroxysmal anxiety] without agoraphobia: Secondary | ICD-10-CM | POA: Diagnosis present

## 2018-01-03 DIAGNOSIS — E1151 Type 2 diabetes mellitus with diabetic peripheral angiopathy without gangrene: Secondary | ICD-10-CM | POA: Diagnosis present

## 2018-01-03 DIAGNOSIS — E785 Hyperlipidemia, unspecified: Secondary | ICD-10-CM | POA: Diagnosis present

## 2018-01-03 DIAGNOSIS — Z79899 Other long term (current) drug therapy: Secondary | ICD-10-CM | POA: Diagnosis not present

## 2018-01-03 DIAGNOSIS — I69398 Other sequelae of cerebral infarction: Secondary | ICD-10-CM | POA: Diagnosis not present

## 2018-01-03 DIAGNOSIS — I2510 Atherosclerotic heart disease of native coronary artery without angina pectoris: Secondary | ICD-10-CM | POA: Diagnosis present

## 2018-01-03 DIAGNOSIS — M19032 Primary osteoarthritis, left wrist: Secondary | ICD-10-CM | POA: Diagnosis not present

## 2018-01-03 DIAGNOSIS — IMO0002 Reserved for concepts with insufficient information to code with codable children: Secondary | ICD-10-CM | POA: Diagnosis present

## 2018-01-03 DIAGNOSIS — I10 Essential (primary) hypertension: Secondary | ICD-10-CM

## 2018-01-03 DIAGNOSIS — E1165 Type 2 diabetes mellitus with hyperglycemia: Secondary | ICD-10-CM | POA: Diagnosis present

## 2018-01-03 DIAGNOSIS — M19031 Primary osteoarthritis, right wrist: Secondary | ICD-10-CM | POA: Diagnosis present

## 2018-01-03 DIAGNOSIS — I69354 Hemiplegia and hemiparesis following cerebral infarction affecting left non-dominant side: Principal | ICD-10-CM

## 2018-01-03 DIAGNOSIS — J449 Chronic obstructive pulmonary disease, unspecified: Secondary | ICD-10-CM

## 2018-01-03 DIAGNOSIS — Z7901 Long term (current) use of anticoagulants: Secondary | ICD-10-CM

## 2018-01-03 DIAGNOSIS — E1142 Type 2 diabetes mellitus with diabetic polyneuropathy: Secondary | ICD-10-CM | POA: Diagnosis present

## 2018-01-03 DIAGNOSIS — K5900 Constipation, unspecified: Secondary | ICD-10-CM | POA: Diagnosis present

## 2018-01-03 DIAGNOSIS — Z833 Family history of diabetes mellitus: Secondary | ICD-10-CM

## 2018-01-03 DIAGNOSIS — R52 Pain, unspecified: Secondary | ICD-10-CM | POA: Diagnosis not present

## 2018-01-03 DIAGNOSIS — Z86711 Personal history of pulmonary embolism: Secondary | ICD-10-CM | POA: Diagnosis not present

## 2018-01-03 DIAGNOSIS — Z87891 Personal history of nicotine dependence: Secondary | ICD-10-CM | POA: Diagnosis not present

## 2018-01-03 DIAGNOSIS — I63511 Cerebral infarction due to unspecified occlusion or stenosis of right middle cerebral artery: Secondary | ICD-10-CM

## 2018-01-03 DIAGNOSIS — Z7984 Long term (current) use of oral hypoglycemic drugs: Secondary | ICD-10-CM | POA: Diagnosis not present

## 2018-01-03 DIAGNOSIS — E119 Type 2 diabetes mellitus without complications: Secondary | ICD-10-CM | POA: Diagnosis not present

## 2018-01-03 DIAGNOSIS — Z23 Encounter for immunization: Secondary | ICD-10-CM

## 2018-01-03 DIAGNOSIS — M1120 Other chondrocalcinosis, unspecified site: Secondary | ICD-10-CM | POA: Diagnosis present

## 2018-01-03 LAB — GLUCOSE, CAPILLARY
GLUCOSE-CAPILLARY: 125 mg/dL — AB (ref 70–99)
Glucose-Capillary: 140 mg/dL — ABNORMAL HIGH (ref 70–99)
Glucose-Capillary: 117 mg/dL — ABNORMAL HIGH (ref 70–99)
Glucose-Capillary: 164 mg/dL — ABNORMAL HIGH (ref 70–99)

## 2018-01-03 LAB — CBC WITH DIFFERENTIAL/PLATELET
Abs Immature Granulocytes: 0.07 10*3/uL (ref 0.00–0.07)
BASOS PCT: 0 %
Basophils Absolute: 0 10*3/uL (ref 0.0–0.1)
EOS ABS: 0.3 10*3/uL (ref 0.0–0.5)
Eosinophils Relative: 2 %
HCT: 35.6 % — ABNORMAL LOW (ref 36.0–46.0)
Hemoglobin: 11.3 g/dL — ABNORMAL LOW (ref 12.0–15.0)
Immature Granulocytes: 1 %
Lymphocytes Relative: 7 %
Lymphs Abs: 1 10*3/uL (ref 0.7–4.0)
MCH: 28.4 pg (ref 26.0–34.0)
MCHC: 31.7 g/dL (ref 30.0–36.0)
MCV: 89.4 fL (ref 80.0–100.0)
MONO ABS: 1.9 10*3/uL — AB (ref 0.1–1.0)
MONOS PCT: 14 %
Neutro Abs: 10 10*3/uL — ABNORMAL HIGH (ref 1.7–7.7)
Neutrophils Relative %: 76 %
PLATELETS: 183 10*3/uL (ref 150–400)
RBC: 3.98 MIL/uL (ref 3.87–5.11)
RDW: 13.3 % (ref 11.5–15.5)
WBC: 13.3 10*3/uL — ABNORMAL HIGH (ref 4.0–10.5)
nRBC: 0 % (ref 0.0–0.2)

## 2018-01-03 LAB — URINALYSIS, ROUTINE W REFLEX MICROSCOPIC
BACTERIA UA: NONE SEEN
Bilirubin Urine: NEGATIVE
Glucose, UA: NEGATIVE mg/dL
HGB URINE DIPSTICK: NEGATIVE
KETONES UR: NEGATIVE mg/dL
NITRITE: NEGATIVE
PROTEIN: NEGATIVE mg/dL
Specific Gravity, Urine: 1.025 (ref 1.005–1.030)
pH: 5 (ref 5.0–8.0)

## 2018-01-03 LAB — COMPREHENSIVE METABOLIC PANEL
ALT: 16 U/L (ref 0–44)
AST: 21 U/L (ref 15–41)
Albumin: 3.4 g/dL — ABNORMAL LOW (ref 3.5–5.0)
Alkaline Phosphatase: 52 U/L (ref 38–126)
Anion gap: 9 (ref 5–15)
BUN: 21 mg/dL (ref 8–23)
CHLORIDE: 100 mmol/L (ref 98–111)
CO2: 28 mmol/L (ref 22–32)
Calcium: 9.2 mg/dL (ref 8.9–10.3)
Creatinine, Ser: 0.97 mg/dL (ref 0.44–1.00)
GFR, EST NON AFRICAN AMERICAN: 54 mL/min — AB (ref >60)
Glucose, Bld: 151 mg/dL — ABNORMAL HIGH (ref 70–99)
Potassium: 3.8 mmol/L (ref 3.5–5.1)
Sodium: 137 mmol/L (ref 135–145)
Total Bilirubin: 1.1 mg/dL (ref 0.3–1.2)
Total Protein: 6.4 g/dL — ABNORMAL LOW (ref 6.5–8.1)

## 2018-01-03 MED ORDER — APIXABAN 5 MG PO TABS
5.0000 mg | ORAL_TABLET | Freq: Two times a day (BID) | ORAL | Status: DC
  Administered 2018-01-03 – 2018-01-19 (×32): 5 mg via ORAL
  Filled 2018-01-03 (×33): qty 1

## 2018-01-03 MED ORDER — INSULIN ASPART 100 UNIT/ML ~~LOC~~ SOLN: 0.0000 [IU] | Freq: Three times a day (TID) | SUBCUTANEOUS | 11 refills | Status: DC

## 2018-01-03 MED ORDER — BUSPIRONE HCL 5 MG PO TABS
15.0000 mg | ORAL_TABLET | Freq: Two times a day (BID) | ORAL | Status: DC
  Administered 2018-01-03 – 2018-01-19 (×32): 15 mg via ORAL
  Filled 2018-01-03 (×32): qty 3

## 2018-01-03 MED ORDER — SERTRALINE HCL 100 MG PO TABS
100.0000 mg | ORAL_TABLET | Freq: Every day | ORAL | Status: DC
  Administered 2018-01-04 – 2018-01-19 (×16): 100 mg via ORAL
  Filled 2018-01-03 (×16): qty 1

## 2018-01-03 MED ORDER — OXYCODONE HCL 5 MG PO TABS: 5.0000 mg | ORAL_TABLET | Freq: Four times a day (QID) | ORAL | Status: DC | PRN

## 2018-01-03 MED ORDER — ORAL CARE MOUTH RINSE
15.0000 mL | Freq: Two times a day (BID) | OROMUCOSAL | Status: DC
  Administered 2018-01-03 – 2018-01-18 (×18): 15 mL via OROMUCOSAL

## 2018-01-03 MED ORDER — METFORMIN HCL 500 MG PO TABS
1000.0000 mg | ORAL_TABLET | Freq: Two times a day (BID) | ORAL | Status: DC
  Administered 2018-01-03 – 2018-01-19 (×32): 1000 mg via ORAL
  Filled 2018-01-03 (×33): qty 2

## 2018-01-03 MED ORDER — ASPIRIN EC 81 MG PO TBEC
81.0000 mg | DELAYED_RELEASE_TABLET | Freq: Every day | ORAL | Status: DC
  Administered 2018-01-04 – 2018-01-19 (×16): 81 mg via ORAL
  Filled 2018-01-03 (×16): qty 1

## 2018-01-03 MED ORDER — GABAPENTIN 400 MG PO CAPS
400.0000 mg | ORAL_CAPSULE | Freq: Every day | ORAL | Status: DC
  Administered 2018-01-04 – 2018-01-19 (×16): 400 mg via ORAL
  Filled 2018-01-03 (×16): qty 1

## 2018-01-03 MED ORDER — PRAVASTATIN SODIUM 80 MG PO TABS: 80.0000 mg | ORAL_TABLET | Freq: Every day | ORAL | Status: DC

## 2018-01-03 MED ORDER — ACETAMINOPHEN 325 MG PO TABS
650.0000 mg | ORAL_TABLET | ORAL | Status: DC | PRN
  Filled 2018-01-03: qty 2

## 2018-01-03 MED ORDER — PRAVASTATIN SODIUM 40 MG PO TABS
80.0000 mg | ORAL_TABLET | Freq: Every day | ORAL | Status: DC
  Administered 2018-01-04 – 2018-01-18 (×15): 80 mg via ORAL
  Filled 2018-01-03 (×15): qty 2

## 2018-01-03 MED ORDER — ASPIRIN 81 MG PO TBEC: 81.0000 mg | DELAYED_RELEASE_TABLET | Freq: Every day | ORAL | Status: AC

## 2018-01-03 MED ORDER — INFLUENZA VAC SPLIT HIGH-DOSE 0.5 ML IM SUSY
0.5000 mL | PREFILLED_SYRINGE | INTRAMUSCULAR | Status: AC
  Administered 2018-01-04: 0.5 mL via INTRAMUSCULAR
  Filled 2018-01-03: qty 0.5

## 2018-01-03 MED ORDER — AMLODIPINE BESYLATE 10 MG PO TABS
10.0000 mg | ORAL_TABLET | Freq: Every day | ORAL | Status: DC
  Administered 2018-01-04 – 2018-01-19 (×16): 10 mg via ORAL
  Filled 2018-01-03 (×16): qty 1

## 2018-01-03 MED ORDER — METOPROLOL TARTRATE 50 MG PO TABS
100.0000 mg | ORAL_TABLET | Freq: Two times a day (BID) | ORAL | Status: DC
  Administered 2018-01-03 – 2018-01-19 (×32): 100 mg via ORAL
  Filled 2018-01-03 (×32): qty 2

## 2018-01-03 MED ORDER — LOSARTAN POTASSIUM 50 MG PO TABS
100.0000 mg | ORAL_TABLET | Freq: Every day | ORAL | Status: DC
  Administered 2018-01-04 – 2018-01-17 (×14): 100 mg via ORAL
  Filled 2018-01-03 (×14): qty 2

## 2018-01-03 MED ORDER — OXYCODONE-ACETAMINOPHEN 5-325 MG PO TABS: 1.0000 | ORAL_TABLET | Freq: Four times a day (QID) | ORAL | 0 refills | Status: DC | PRN

## 2018-01-03 MED ORDER — DIPHENOXYLATE-ATROPINE 2.5-0.025 MG PO TABS
1.0000 | ORAL_TABLET | Freq: Four times a day (QID) | ORAL | Status: DC | PRN
  Administered 2018-01-07 – 2018-01-13 (×4): 1 via ORAL
  Filled 2018-01-03 (×4): qty 1

## 2018-01-03 MED ORDER — OXYCODONE-ACETAMINOPHEN 5-325 MG PO TABS
1.0000 | ORAL_TABLET | Freq: Four times a day (QID) | ORAL | Status: DC | PRN
  Administered 2018-01-03 – 2018-01-19 (×37): 1 via ORAL
  Filled 2018-01-03 (×37): qty 1

## 2018-01-03 MED ORDER — BUTALBITAL-APAP-CAFFEINE 50-325-40 MG PO TABS: 1.0000 | ORAL_TABLET | Freq: Two times a day (BID) | ORAL | 0 refills | Status: DC | PRN

## 2018-01-03 MED ORDER — SORBITOL 70 % SOLN: 30.0000 mL | Freq: Every day | Status: DC | PRN

## 2018-01-03 MED ORDER — INSULIN ASPART 100 UNIT/ML ~~LOC~~ SOLN
0.0000 [IU] | Freq: Three times a day (TID) | SUBCUTANEOUS | Status: DC
  Administered 2018-01-04 – 2018-01-09 (×9): 1 [IU] via SUBCUTANEOUS
  Administered 2018-01-10: 2 [IU] via SUBCUTANEOUS
  Administered 2018-01-11: 1 [IU] via SUBCUTANEOUS
  Administered 2018-01-11: 2 [IU] via SUBCUTANEOUS
  Administered 2018-01-12 (×3): 1 [IU] via SUBCUTANEOUS
  Administered 2018-01-13 – 2018-01-14 (×2): 2 [IU] via SUBCUTANEOUS
  Administered 2018-01-14 – 2018-01-15 (×2): 1 [IU] via SUBCUTANEOUS
  Administered 2018-01-15: 2 [IU] via SUBCUTANEOUS
  Administered 2018-01-16 – 2018-01-19 (×7): 1 [IU] via SUBCUTANEOUS

## 2018-01-03 MED ORDER — OXYCODONE-ACETAMINOPHEN 5-325 MG PO TABS
1.0000 | ORAL_TABLET | Freq: Four times a day (QID) | ORAL | Status: DC | PRN
  Administered 2018-01-03: 1 via ORAL
  Filled 2018-01-03: qty 1

## 2018-01-03 MED ORDER — METOPROLOL TARTRATE 100 MG PO TABS: 100.0000 mg | ORAL_TABLET | Freq: Two times a day (BID) | ORAL | Status: DC

## 2018-01-03 MED ORDER — BUTALBITAL-APAP-CAFFEINE 50-325-40 MG PO TABS: 1.0000 | ORAL_TABLET | Freq: Two times a day (BID) | ORAL | Status: DC | PRN

## 2018-01-03 NOTE — Care Management Note (Signed)
Case Management Note  Patient Details  Name: Catherine Greene MRN: [id number] Date of Birth: April 17, 1940  Subjective/Objective:                    Action/Plan: Pt discharging to CIR today. CM signing off.   Expected Discharge Date:  01/03/18               Expected Discharge Plan:  [place]  In-House Referral:     Discharge planning Services  CM Consult  Post Acute Care Choice:    Choice offered to:     DME Arranged:    DME Agency:     HH Arranged:    HH Agency:     Status of Service:  Completed, signed off  If discussed at [employer], dates discussed:    Additional Comments:  Pollie Friar, RN 01/03/2018, 1:59 PM

## 2018-01-03 NOTE — Progress Notes (Signed)
Catherine Gong, RN  Rehab Admission Coordinator  Physical Medicine and Rehabilitation  PMR Pre-admission  Signed  Date of Service:  01/03/2018 1:49 PM       Related encounter: ED to Hosp-Admission (Discharged) from 12/28/2017 in [facility]      Signed         Show:Clear all [x] Manual[x] Template[x] Copied  Added by: [x] Catherine Gong, RN  [] Hover for details PMR Admission Coordinator Pre-Admission Assessment  Patient: Catherine Greene is an 77 y.o., female MRN: [id number] DOB: 1940-11-16 Height:   Weight:                                                                                                                                                    Insurance Information HMO: yes    PPO:      PCP:      IPA:      80/20:      OTHER: medicare advantage plan PRIMARY: U[employer] Medicare      Policy#: [id number]      Subscriber: pt CM Name: Catherine Greene      Phone#: [phone]     Fax#: [phone] Pre-Cert#: [id number]  Approved for 7 days with f/u Catherine Greene phone [phone] fax [phone]    Employer: retired Benefits:  Phone #: [phone]     Name: 01/03/2018 Eff. Date: 03/22/2017     Deduct: none      Out of Pocket Max: $4400      Life Max: none CIR: $345 co pay per day days 1 until 5      SNF: no co pay days 1 until 20: $160 co pay per day days 21 until 48; no co pay days 49 until 100 Outpatient: $40 co pay per visit     Co-Pay: visits per medical neccesity Home Health: 100%      Co-Pay: visits per medical neccesity DME: 80%     Co-Pay: 20% Providers: in network  SECONDARY: none        Medicaid Application Date:       Case Manager:  Disability Application Date:       Case Worker:   [employer] copy Information    Name Relation Home Work Mobile   Catherine Greene Daughter [phone]  [phone]   No name specified         Current Medical History  Patient Admitting Diagnosis: right MCA  infarct  History of Present Illness:HPI:Catherine Greene a 77 year old right-handed female with history of CAD, COPD with remote tobacco abuse, diabetes mellitus, 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 Presented 12/28/2017 with complaints of left-sided weakness. Cranial CT scan showed right occipital lobe infarction new since prior exam.  No acute anterior circulation infarction. MRI/MRA showed scattered foci of acute subacute cortical infarcts involving the right frontal, parietal, temporal and occipital lobes. 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 Echocardiogram with ejection fraction of 70% no wall motion abnormalities grade 1 diastolic dysfunction. Underwent partial revascularization of left ICA terminus per interventional radiology. TCD bubble study completed clinically insignificant findings of trivial PFO. Neurology follow-up currently maintained on aspirin as well as Eliquis for CVA prophylaxis. Tolerating a regular diet.   Complete NIHSS TOTAL: 3  Past Medical History      Past Medical History:  Diagnosis Date  . CAD (coronary artery disease)   . Diabetes mellitus    type 2  . Hyperlipidemia   . Hypertension   . Past heart attack 1998    Family History  family history includes Bone cancer (age of onset: 1) in her mother; Heart failure (age of onset: 39) in her father.  Prior Rehab/Hospitalizations:  Has the patient had major surgery during 100 days prior to admission? No  Current Medications   Current Facility-Administered Medications:  .  acetaminophen (TYLENOL) tablet 650 mg, 650 mg, Oral, Q4H PRN, 650 mg at 01/02/18 1814 **OR** [DISCONTINUED] acetaminophen (TYLENOL) solution 650 mg, 650 mg, Per Tube, Q4H PRN **OR** [DISCONTINUED] acetaminophen (TYLENOL) suppository 650 mg,  650 mg, Rectal, Q4H PRN, Amie Portland, MD, 650 mg at 12/28/17 1949 .  amLODipine (NORVASC) tablet 10 mg, 10 mg, Oral, Daily, Rosalin Hawking, MD, 10 mg at 01/03/18 0956 .  apixaban (ELIQUIS) tablet 5 mg, 5 mg, Oral, BID, Rosalin Hawking, MD, 5 mg at 01/03/18 0955 .  aspirin EC tablet 81 mg, 81 mg, Oral, Daily, Rosalin Hawking, MD, 81 mg at 01/03/18 0955 .  busPIRone (BUSPAR) tablet 15 mg, 15 mg, Oral, BID, Rosalin Hawking, MD, 15 mg at 01/03/18 0956 .  butalbital-acetaminophen-caffeine (FIORICET, ESGIC) 50-325-40 MG per tablet 1 tablet, 1 tablet, Oral, Q12H PRN, Rosalin Hawking, MD, 1 tablet at 01/01/18 2219 .  diphenoxylate-atropine (LOMOTIL) 2.5-0.025 MG per tablet 1 tablet, 1 tablet, Oral, QID PRN, Rosalin Hawking, MD, 1 tablet at 01/02/18 2831 .  gabapentin (NEURONTIN) capsule 400 mg, 400 mg, Oral, Daily, Amie Portland, MD, 400 mg at 01/03/18 0956 .  insulin aspart (novoLOG) injection 0-9 Units, 0-9 Units, Subcutaneous, TID WC, Amie Portland, MD, 1 Units at 01/03/18 1229 .  losartan (COZAAR) tablet 100 mg, 100 mg, Oral, Daily, Rosalin Hawking, MD, 100 mg at 01/03/18 0956 .  MEDLINE mouth rinse, 15 mL, Mouth Rinse, BID, Amie Portland, MD, 15 mL at 01/03/18 1001 .  metFORMIN (GLUCOPHAGE) tablet 1,000 mg, 1,000 mg, Oral, BID WC, Rosalin Hawking, MD, 1,000 mg at 01/03/18 0820 .  metoprolol tartrate (LOPRESSOR) tablet 100 mg, 100 mg, Oral, BID, Rosalin Hawking, MD, 100 mg at 01/03/18 0955 .  oxyCODONE-acetaminophen (PERCOCET/ROXICET) 5-325 MG per tablet 1 tablet, 1 tablet, Oral, Q6H PRN, Donzetta Starch, NP, 1 tablet at 01/03/18 1136 .  pravastatin (PRAVACHOL) tablet 80 mg, 80 mg, Oral, Daily, Rosalin Hawking, MD, 80 mg at 01/03/18 0955 .  sertraline (ZOLOFT) tablet 100 mg, 100 mg, Oral, Daily, Rosalin Hawking, MD, 100 mg at 01/03/18 0955 .  sodium chloride flush (NS) 0.9 % injection 3 mL, 3 mL, Intravenous, Q12H, Rosalin Hawking, MD, 3 mL at 01/03/18 1003 .  sodium chloride flush (NS) 0.9 % injection 3 mL, 3 mL, Intravenous, PRN, Rosalin Hawking,  MD  Patients Current Diet:     Diet Order  Diet heart healthy/carb modified Room service appropriate? Yes; Fluid consistency: Thin  Diet effective now               Precautions / Restrictions Precautions Precautions: Fall Restrictions Weight Bearing Restrictions: No   Has the patient had 2 or more falls or a fall with injury in the past year?No  Prior Activity Level Community (5-7x/wk): declining health since June, 2019. Was Mod I with cane over past couple months  [place] Devices/Equipment: Radio producer (specify quad or straight) Home Equipment: Cane - single point, Bedside commode  Prior Device Use: Indicate devices/aids used by the patient prior to current illness, exacerbation or injury? cane  Prior Functional Level Prior Function Level of Independence: Independent with assistive device(s) Comments: pt did have a gait disturbance, but independent in the home with/without AD and cane use outdoors  Self Care: Did the patient need help bathing, dressing, using the toilet or eating?  Independent  Indoor Mobility: Did the patient need assistance with walking from room to room (with or without device)? Independent  Stairs: Did the patient need assistance with internal or external stairs (with or without device)? Needed some help  Functional Cognition: Did the patient need help planning regular tasks such as shopping or remembering to take medications? Needed some help  Current Functional Level Cognition  Arousal/Alertness: Awake/alert Overall Cognitive Status: Impaired/Different from baseline Orientation Level: Oriented X4 Following Commands: Follows one step commands inconsistently, Follows multi-step commands inconsistently General Comments: Pt requiring increased time and cues during session. Very motivated to participate in therapy Attention: Sustained, Selective Sustained Attention: Appears  intact Selective Attention: Appears intact Memory: Impaired Memory Impairment: Decreased short term memory, Decreased recall of new information, Other (comment)(working memory) Decreased Short Term Memory: Verbal basic, Functional basic Awareness: Impaired Awareness Impairment: Emergent impairment, Anticipatory impairment Executive Function: Reasoning, Sequencing Reasoning: Impaired Reasoning Impairment: Verbal basic Sequencing: Impaired Sequencing Impairment: Verbal complex Safety/Judgment: Impaired(decreased awareness of L visual field)    Extremity Assessment (includes Sensation/Coordination)  Upper Extremity Assessment: LUE deficits/detail RUE Deficits / Details: demonstrates decr shoulder flexion and AROM to 80 degrees. pt noted to have incr efforts at 80 degrees, bicep and tricep 4 out 5 MMT LUE Deficits / Details: Decreased shoulder flexion to ~30 degrees. Poor finger flexion/extension, grasp strength, and dexerity. Pt with decreased initation of movement at LUE LUE Coordination: decreased fine motor, decreased gross motor  Lower Extremity Assessment: Defer to PT evaluation    ADLs  Overall ADL's : Needs assistance/impaired Eating/Feeding: Minimal assistance, Sitting Grooming: Moderate assistance, Wash/dry hands, Standing Grooming Details (indicate cue type and reason): Pt performing hand hygiene at sink with Mod A for standing balance. Requiring Min A to faciltiate normalized movement of LUE when reaching (i.e. for soap or turn on faucet.) Upper Body Bathing: Moderate assistance Lower Body Bathing: Maximal assistance Lower Body Dressing: Moderate assistance, Sit to/from stand Lower Body Dressing Details (indicate cue type and reason): Pt bending forward to pull up socks. using RUE only and not initating LUE towards socks. Mod A for standing balance Toilet Transfer: Minimal assistance, +2 for physical assistance, Ambulation, BSC Toilet Transfer Details (indicate cue type and  reason): Min A +2 for power up into standing and then to guide to [facility]. Pt presenting with weakness and decreased balance Toileting- Clothing Manipulation and Hygiene: Moderate assistance, Sit to/from stand Toileting - Clothing Manipulation Details (indicate cue type and reason): Mod A for maintaining balance and then pt performing peri care Functional mobility  during ADLs: Moderate assistance, +2 for physical assistance General ADL Comments: Pt highly motivated. Continues to present with decreased balance, strength, vision, and functional use of LUE.    Mobility  Overal bed mobility: Needs Assistance Bed Mobility: Rolling, Sit to Sidelying Rolling: Min guard Supine to sit: Min guard Sit to sidelying: Min assist General bed mobility comments: assist to elevate LEs back to bed    Transfers  Overall transfer level: Needs assistance Equipment used: Rolling walker (2 wheeled) Transfers: Sit to/from Stand Sit to Stand: Min assist Stand pivot transfers: Min assist General transfer comment: Vcs for hand placement and postioing, min assist to power up to standing in increased time to power from varying surfaces    Ambulation / Gait / Stairs / Wheelchair Mobility  Ambulation/Gait Ambulation/Gait assistance: Mod assist, +2 physical assistance Gait Distance (Feet): 20 Feet Assistive device: 2 person hand held assist Gait Pattern/deviations: Step-through pattern, Decreased stride length, Decreased weight shift to left, Trunk flexed, Drifts right/left General Gait Details: patient with noted posterior instability and left sided fatigue during mobility.     Posture / Balance Dynamic Sitting Balance Sitting balance - Comments: needing external assist Balance Overall balance assessment: Needs assistance Sitting-balance support: Feet supported Sitting balance-Leahy Scale: Fair Sitting balance - Comments: needing external assist Standing balance support: Bilateral upper extremity  supported Standing balance-Leahy Scale: Poor Standing balance comment: reliant on external assist for stability due to posterior bias    Special needs/care consideration BiPAP/CPAP n/a CPM n/a Continuous Drip IV n/a Dialysis n/a Life Vest n/a Oxygen n/a Special Bed n/a Trach Size n/a Wound Vac n/a Skin right and left femoral caths sites with dressing; ecchymosis to right thigh Bowel mgmt: continent LBM 10/15 Bladder mgmt: continent Diabetic mgmt Hgb A1c 5.7   Previous Home Environment Living Arrangements: (boyfriend, Gwyndolyn Saxon "[employer]" with her 24/7, daughter Juliann Pulse v)  Lives With: Significant other, Daughter Available Help at Discharge: Family, Available 24 hours/day Type of Home: Mobile home Home Layout: One level Home Access: Stairs to enter Entrance Stairs-Rails: Right, Left Entrance Stairs-Number of Steps: several Bathroom Shower/Tub: Multimedia programmer: Standard Bathroom Accessibility: Yes How Accessible: Accessible via walker Home Care Services: (has used [facility] in the past)  Discharge Living Setting Plans for Discharge Living Setting: Patient's home, Mobile Home, Lives with (comment)(daughter and pt's boyfriend) Type of Home at Discharge: Mobile home Discharge Home Layout: One level Discharge Home Access: Stairs to enter Entrance Stairs-Rails: Right, Left Entrance Stairs-Number of Steps: several Discharge Bathroom Shower/Tub: Walk-in shower Discharge Bathroom Toilet: Standard Discharge Bathroom Accessibility: Yes How Accessible: Accessible via walker Does the patient have any problems obtaining your medications?: No  Social/Family/Support Systems Patient Roles: Partner, Parent Contact Information: Juliann Pulse, daughter requests to be first contact Anticipated Caregiver: daughter and pt's boyfriend Anticipated Caregiver's Contact Information: see above Ability/Limitations of Caregiver: none Caregiver Availability: 24/7 Discharge Plan  Discussed with Primary Caregiver: Yes Is Caregiver In Agreement with Plan?: Yes Does Caregiver/Family have Issues with Lodging/Transportation while Pt is in Rehab?: No  Goals/Additional Needs Patient/Family Goal for Rehab: supervision PT, OT, and SLP Expected length of stay: [facility]OS 7 to 10 days Pt/Family Agrees to Admission and willing to participate: Yes Program Orientation Provided & Reviewed with Pt/Caregiver Including Roles  & Responsibilities: Yes  Decrease burden of Care through IP rehab admission: n/a  Possible need for SNF placement upon discharge: not anticipated  Patient Condition: This patient's medical and functional status has changed since the consult dated: 01/01/2018  in which  the Rehabilitation Physician determined and documented that the patient's condition is appropriate for intensive rehabilitative care in an inpatient rehabilitation facility. See "History of Present Illness" (above) for medical update. Functional changes are: overall min to mod assist. Patient's medical and functional status update has been discussed with the Rehabilitation physician and patient remains appropriate for inpatient rehabilitation. Will admit to inpatient rehab today.  Preadmission Screen Completed By:  Cleatrice Burke, 01/03/2018 1:49 PM ______________________________________________________________________   Discussed status with Dr. Naaman Plummer on 01/03/2018 at  1357 and received telephone approval for admission today.  Admission Coordinator:  Cleatrice Burke, time 9914 Date 01/03/2018           Cosigned by: Meredith Staggers, MD at 01/03/2018 3:05 PM  Revision History

## 2018-01-03 NOTE — H&P (Signed)
Physical Medicine and Rehabilitation Admission H&P       Chief Complaint  Patient presents with  . 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  Per chart review patient lives with spouse.  Mobile home with 5 steps to entry.  Presented 12/28/2017 with complaints of left-sided weakness.  Cranial CT scan showed right occipital lobe infarction new since prior exam.  No acute anterior circulation infarction.  MRI/MRA showed scattered foci of acute subacute cortical infarcts involving the right frontal, parietal, temporal and occipital lobes.  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  Echocardiogram with ejection fraction of 70% no wall motion abnormalities grade 1 diastolic dysfunction.  Underwent partial revascularization of left ICA terminus per interventional radiology.  TCD bubble study completed clinically insignificant findings of trivial PFO.  Neurology follow-up currently maintained on aspirin as well as Eliquis for CVA prophylaxis.  Tolerating a regular diet.  Therapy evaluations completed with recommendations of physical medicine rehab consult.  Patient was admitted for a comprehensive rehab program.  Review of Systems  Constitutional: Negative for chills and fever.  HENT: Negative for hearing loss.   Eyes: Positive for blurred vision. Negative for double vision.  Respiratory: Negative for cough and shortness of breath.   Cardiovascular: Positive for palpitations. Negative for leg swelling.  Gastrointestinal: Positive for constipation. Negative for nausea.  Genitourinary: Negative for dysuria, flank pain and hematuria.  Skin: Negative for rash.  Neurological: Positive  for focal weakness.  Psychiatric/Behavioral: Positive for depression.       Panic attacks  All other systems reviewed and are negative.      Past Medical History:  Diagnosis Date  . CAD (coronary artery disease)   . Diabetes mellitus    type 2  . Hyperlipidemia   . Hypertension   . Past heart attack 1998        Past Surgical History:  Procedure Laterality Date  . APPENDECTOMY    . BACK SURGERY     x 2  . CESAREAN SECTION     x 3  . IR ANGIO INTRA EXTRACRAN SEL COM CAROTID INNOMINATE BILAT MOD SED  12/28/2017  . IR ANGIO VERTEBRAL SEL SUBCLAVIAN INNOMINATE UNI R MOD SED  12/28/2017  . IR ANGIO VERTEBRAL SEL VERTEBRAL UNI L MOD SED  12/28/2017  . NECK SURGERY    . RADIOLOGY WITH ANESTHESIA N/A 12/28/2017   Procedure: IR WITH ANESTHESIA;  Surgeon: Luanne Bras, MD;  Location: [facility];  Service: Radiology;  Laterality: N/A;  . Skin cancer removal face    . TONSILLECTOMY          Family History  Problem Relation Age of Onset  . Bone cancer Mother 71       died  . Heart failure Father 48       died   Social History:  reports that she quit smoking about 21 years ago. Her smoking use included cigarettes. She has a 80.00 pack-year smoking history. She has never used smokeless tobacco. She reports that she does not drink alcohol or use drugs. Allergies:       Allergies  Allergen Reactions  . Codeine     REACTION: sickness  . Contrast Media [Iodinated Diagnostic Agents]   .  Metrizamide Other (See Comments)    Upset stomach  . Morphine     REACTION: sickness         Medications Prior to Admission  Medication Sig Dispense Refill  . amLODipine (NORVASC) 10 MG tablet Take 10 mg by mouth daily.    . busPIRone (BUSPAR) 15 MG tablet Take 15 mg by mouth 2 (two) times daily.    . diphenoxylate-atropine (LOMOTIL) 2.5-0.025 MG tablet Take 1 tablet by mouth 4 (four) times daily as needed for diarrhea or loose stools.     Marland Kitchen ELIQUIS 5 MG  TABS tablet Take 5 mg by mouth 2 (two) times daily.     Marland Kitchen gabapentin (NEURONTIN) 400 MG capsule Take 400 mg by mouth every evening.     Marland Kitchen glipiZIDE (GLUCOTROL) 5 MG tablet Take 5 mg by mouth 2 (two) times daily before a meal.    . losartan (COZAAR) 100 MG tablet Take 100 mg by mouth daily.    . magnesium oxide (MAG-OX) 400 MG tablet Take 400 mg by mouth daily.     . metFORMIN (GLUCOPHAGE) 1000 MG tablet Take 1,000 mg by mouth 2 (two) times daily with a meal.     . metoprolol tartrate (LOPRESSOR) 50 MG tablet Take 200 mg by mouth 2 (two) times daily.    . Omega-3 Fatty Acids (FISH OIL) 1000 MG CAPS Take 2,000 mg by mouth daily.     . pravastatin (PRAVACHOL) 40 MG tablet Take 40 mg by mouth daily.    . ranitidine (ZANTAC) 150 MG tablet Take 150 mg by mouth 2 (two) times daily.     . sertraline (ZOLOFT) 100 MG tablet Take 100 mg by mouth daily.      Drug Regimen Review Drug regimen was reviewed and remains appropriate with no significant issues identified  Home: Home Living Family/patient expects to be discharged to:: Private residence Living Arrangements: Spouse/significant other, Children Available Help at Discharge: Family, Available 24 hours/day Type of Home: Mobile home Home Access: Stairs to enter [employer] of Steps: several Entrance Stairs-Rails: Right, Left Home Layout: One level Bathroom Shower/Tub: Multimedia programmer: Standard Home Equipment: Radio producer - single point, Bedside commode  Lives With: Daughter, Significant other   Functional History: Prior Function Level of Independence: Independent with assistive device(s) Comments: pt did have a gait disturbance, but independent in the home with/without AD and cane use outdoors  Functional Status:  Mobility: Bed Mobility Overal bed mobility: Needs Assistance Bed Mobility: Rolling, Sit to Sidelying Rolling: Min guard Supine to sit: Min guard Sit to sidelying: Min  assist General bed mobility comments: assist to elevate LEs back to bed Transfers Overall transfer level: Needs assistance Equipment used: Rolling walker (2 wheeled) Transfers: Sit to/from Stand Sit to Stand: Min assist Stand pivot transfers: Min assist General transfer comment: Vcs for hand placement and postioing, min assist to power up to standing in increased time to power from varying surfaces Ambulation/Gait Ambulation/Gait assistance: Mod assist, +2 physical assistance Gait Distance (Feet): 20 Feet Assistive device: 2 person hand held assist Gait Pattern/deviations: Step-through pattern, Decreased stride length, Decreased weight shift to left, Trunk flexed, Drifts right/left General Gait Details: patient with noted posterior instability and left sided fatigue during mobility.   ADL: ADL Overall ADL's : Needs assistance/impaired Eating/Feeding: Minimal assistance, Sitting Grooming: Moderate assistance, Wash/dry hands, Standing Grooming Details (indicate cue type and reason): Pt performing hand hygiene at sink with Mod A for standing balance. Requiring Min A to faciltiate normalized movement of  LUE when reaching (i.e. for soap or turn on faucet.) Upper Body Bathing: Moderate assistance Lower Body Bathing: Maximal assistance Lower Body Dressing: Moderate assistance, Sit to/from stand Lower Body Dressing Details (indicate cue type and reason): Pt bending forward to pull up socks. using RUE only and not initating LUE towards socks. Mod A for standing balance Toilet Transfer: Minimal assistance, +2 for physical assistance, Ambulation, BSC Toilet Transfer Details (indicate cue type and reason): Min A +2 for power up into standing and then to guide to [facility]. Pt presenting with weakness and decreased balance Toileting- Clothing Manipulation and Hygiene: Moderate assistance, Sit to/from stand Toileting - Clothing Manipulation Details (indicate cue type and reason): Mod A for maintaining  balance and then pt performing peri care Functional mobility during ADLs: Moderate assistance, +2 for physical assistance General ADL Comments: Pt highly motivated. Continues to present with decreased balance, strength, vision, and functional use of LUE.  Cognition: Cognition Overall Cognitive Status: Impaired/Different from baseline Arousal/Alertness: Awake/alert Orientation Level: Oriented X4 Attention: Sustained, Selective Sustained Attention: Appears intact Selective Attention: Appears intact Memory: Impaired Memory Impairment: Decreased short term memory, Decreased recall of new information, Other (comment)(working memory) Decreased Short Term Memory: Verbal basic, Functional basic Awareness: Impaired Awareness Impairment: Emergent impairment, Anticipatory impairment Executive Function: Reasoning, Sequencing Reasoning: Impaired Reasoning Impairment: Verbal basic Sequencing: Impaired Sequencing Impairment: Verbal complex Safety/Judgment: Impaired(decreased awareness of L visual field) Cognition Arousal/Alertness: Awake/alert Behavior During Therapy: WFL for tasks assessed/performed Overall Cognitive Status: Impaired/Different from baseline Area of Impairment: Problem solving, Following commands Following Commands: Follows one step commands inconsistently, Follows multi-step commands inconsistently Problem Solving: Requires verbal cues, Requires tactile cues General Comments: Pt requiring increased time and cues during session. Very motivated to participate in therapy  Physical Exam: Blood pressure (!) 152/52, pulse 84, temperature 98 F (36.7 C), temperature source Oral, resp. rate 18, SpO2 99 %. Physical Exam  Constitutional: No distress.  HENT:  Right Ear: External ear normal.  Left Ear: External ear normal.  Eyes: Pupils are equal, round, and reactive to light.  Neck: Normal range of motion.  Cardiovascular: Normal rate. Exam reveals no friction rub.  No murmur  heard. Respiratory: Effort normal. No respiratory distress. She has no wheezes. She has no rales.  GI: Soft. She exhibits no distension. There is no tenderness.  Neurological:  Alert.  Flat affect. Fair insight and awareness. Normal language.   Has a right gaze preference with some left-sided inattention. Likely left HH as well. LUE 2-3/5 prox to distal. LLE 1-2/5 prox to distal. Decreased LT left hand and foot.   Skin: She is not diaphoretic.    LabResultsLast48Hours       Results for orders placed or performed during the hospital encounter of 12/28/17 (from the past 48 hour(s))  Glucose, capillary     Status: Abnormal   Collection Time: 01/01/18 11:42 AM  Result Value Ref Range   Glucose-Capillary 102 (H) 70 - 99 mg/dL   Comment 1 Notify RN    Comment 2 Document in Chart   Glucose, capillary     Status: Abnormal   Collection Time: 01/01/18  4:55 PM  Result Value Ref Range   Glucose-Capillary 136 (H) 70 - 99 mg/dL   Comment 1 Notify RN    Comment 2 Document in Chart   Glucose, capillary     Status: Abnormal   Collection Time: 01/01/18  9:10 PM  Result Value Ref Range   Glucose-Capillary 179 (H) 70 - 99 mg/dL  Glucose, capillary  Status: Abnormal   Collection Time: 01/02/18  6:24 AM  Result Value Ref Range   Glucose-Capillary 140 (H) 70 - 99 mg/dL  Glucose, capillary     Status: Abnormal   Collection Time: 01/02/18 11:19 AM  Result Value Ref Range   Glucose-Capillary 152 (H) 70 - 99 mg/dL  Glucose, capillary     Status: None   Collection Time: 01/02/18  3:48 PM  Result Value Ref Range   Glucose-Capillary 82 70 - 99 mg/dL  Glucose, capillary     Status: Abnormal   Collection Time: 01/02/18  9:17 PM  Result Value Ref Range   Glucose-Capillary 140 (H) 70 - 99 mg/dL   Comment 1 Notify RN    Comment 2 Document in Chart      ImagingResults(Last48hours)  No results found.       Medical Problem List and Plan: 1.  Left-sided  weakness secondary to right MCA scattered infarct embolic.  Status post partial revascularization of left ICA terminus as well as history of CVA             -admit to inpatient rehab 2.  DVT Prophylaxis/Anticoagulation: Eliquis 3. Pain Management: Neurontin 400 mg daily, Fioricet for headaches as needed 4. Mood: BuSpar 15 mg twice daily, Zoloft 100 mg daily 5. Neuropsych: This patient is capable of making decisions on her own behalf. 6. Skin/Wound Care: Routine skin checks 7. Fluids/Electrolytes/Nutrition: Routine in and outs with follow-up chemistries. Encourage PO 8.  Hypertension.  Lopressor 100 mg twice daily, Cozaar 100 mg daily, Norvasc 10 mg daily 9.  Diabetes mellitus with peripheral neuropathy.  Hemoglobin A1c 5.7.  Glucophage 1000 mg twice daily.  Check blood sugars before meals and at bedtime 10.  COPD with remote tobacco abuse.  No shortness of breath reported 11.  Hyperlipidemia.  Pravachol   Post Admission Physician Evaluation: 1. Functional deficits secondary  to Right MCA infarct. 2. Patient is admitted to receive collaborative, interdisciplinary care between the physiatrist, rehab nursing staff, and therapy team. 3. Patient's level of medical complexity and substantial therapy needs in context of that medical necessity cannot be provided at a lesser intensity of care such as a SNF. 4. Patient has experienced substantial functional loss from his/her baseline which was documented above under the "Functional History" and "Functional Status" headings.  Judging by the patient's diagnosis, physical exam, and functional history, the patient has potential for functional progress which will result in measurable gains while on inpatient rehab.  These gains will be of substantial and practical use upon discharge  in facilitating mobility and self-care at the household level. 5. Physiatrist will provide 24 hour management of medical needs as well as oversight of the therapy plan/treatment  and provide guidance as appropriate regarding the interaction of the two. 6. The Preadmission Screening has been reviewed and patient status is unchanged unless otherwise stated above. 7. 24 hour rehab nursing will assist with bladder management, bowel management, safety, skin/wound care, disease management, medication administration and patient education  and help integrate therapy concepts, techniques,education, etc. 8. PT will assess and treat for/with: Lower extremity strength, range of motion, stamina, balance, functional mobility, safety, adaptive techniques and equipment , NMR, visual-spatial awareness, family education.   Goals are: supervision. 9. OT will assess and treat for/with: ADL's, functional mobility, safety, upper extremity strength, adaptive techniques and equipment, NMR, visual-spatial awareness, family ed.   Goals are: supervision. Therapy may proceed with showering this patient. 10. SLP will assess and treat for/with: cognition, commuication.  Goals are: supervision. 11. Case Management and Social Worker will assess and treat for psychological issues and discharge planning. 12. Team conference will be held weekly to assess progress toward goals and to determine barriers to discharge. 13. Patient will receive at least 3 hours of therapy per day at least 5 days per week. 14. ELOS: 7-10 days       15. Prognosis:  excellent   I have personally performed a face to face diagnostic evaluation of this patient and formulated the key components of the plan.  Additionally, I have personally reviewed laboratory data, imaging studies, as well as relevant notes and concur with the physician assistant's documentation above.  Meredith Staggers, MD, Mellody Drown    Lavon Paganini [place], PA-C 01/03/2018               The patient's status has not changed. The original post admission physician evaluation remains appropriate, and any changes from the pre-admission screening or documentation from  the acute chart are noted above.  Meredith Staggers, MD 01/03/2018

## 2018-01-03 NOTE — Progress Notes (Signed)
STROKE TEAM PROGRESS NOTE   SUBJECTIVE (INTERVAL HISTORY) Her husband and daughter are at the bedside. She is lying in bed, no complaints. She is sleepy per RN but appeared unchanged during my exam OBJECTIVE Temp:  [97.8 F (36.6 C)-99.9 F (37.7 C)] 99.9 F (37.7 C) (10/15 1117) Pulse Rate:  [73-95] 80 (10/15 1117) Cardiac Rhythm: Normal sinus rhythm (10/15 0725) Resp:  [16-20] 20 (10/15 1117) BP: (134-157)/(46-56) 134/46 (10/15 1117) SpO2:  [96 %-100 %] 99 % (10/15 1117)  Recent Labs  Lab 01/02/18 1119 01/02/18 1548 01/02/18 2117 01/03/18 0600 01/03/18 1130  GLUCAP 152* 82 140* 140* 125*   Recent Labs  Lab 12/28/17 0827 12/28/17 0853 12/30/17 0337 12/31/17 0348 01/01/18 0555  NA 139 141 142 140 139  K 4.4 3.9 3.9 3.7 3.5  CL 103 103 103 101 101  CO2 27  --  30 29 30   GLUCOSE 131* 138* 141* 147* 151*  BUN 13 14 21 19 16   CREATININE 0.78 0.80 0.71 0.74 0.76  CALCIUM 9.3  --  9.1 9.7 9.4   Recent Labs  Lab 12/28/17 0827  AST 34  ALT 17  ALKPHOS 52  BILITOT 1.4*  PROT 6.6  ALBUMIN 3.9   Recent Labs  Lab 12/28/17 0827 12/28/17 0853 12/30/17 0337 12/31/17 0348 01/01/18 0555  WBC 8.4  --  10.6* 8.9 9.8  NEUTROABS 6.2  --   --   --   --   HGB 13.8 13.6 11.6* 12.6 12.1  HCT 41.5 40.0 37.2 39.9 37.7  MCV 87.9  --  90.1 88.7 87.9  PLT 179  --  164 164 155   No results for input(s): CKTOTAL, CKMB, CKMBINDEX, TROPONINI in the last 168 hours. No results for input(s): LABPROT, INR in the last 72 hours. No results for input(s): COLORURINE, LABSPEC, [place], GLUCOSEU, HGBUR, BILIRUBINUR, KETONESUR, PROTEINUR, UROBILINOGEN, NITRITE, LEUKOCYTESUR in the last 72 hours.  Invalid input(s): APPERANCEUR     Component Value Date/Time   CHOL 205 (H) 12/29/2017 0455   TRIG 350 (H) 12/30/2017 0337   HDL 37 (L) 12/29/2017 0455   CHOLHDL 5.5 12/29/2017 0455   VLDL 61 (H) 12/29/2017 0455   LDLCALC 107 (H) 12/29/2017 0455   Lab Results  Component Value Date   HGBA1C  5.7 (H) 12/29/2017   No results found for: LABOPIA, [place], [place], [place], [place], [place]  Recent Labs  Lab 12/28/17 0827  ETH <10    I have personally reviewed the radiological images below and agree with the radiology interpretations.  Mr Brain Wo Contrast  Result Date: 12/28/2017 CLINICAL DATA:  Acute onset of right-sided weakness. Previous infarcts. Diabetes. Acute onset of symptoms beginning 10 hours ago. EXAM: MRI HEAD WITHOUT CONTRAST MRA HEAD WITHOUT CONTRAST TECHNIQUE: Multiplanar, multiecho pulse sequences of the brain and surrounding structures were obtained without intravenous contrast. Angiographic images of the head were obtained using MRA technique without contrast. COMPARISON:  CT head without contrast from the same day. Cerebral arteriogram from the same day. FINDINGS: MRI HEAD FINDINGS Brain: The diffusion-weighted images demonstrate areas of acute cortical infarction involving the right middle frontal gyrus and the precentral gyrus. There are areas of involvement in the right parietal lobe in occluding the right postcentral gyrus. Scattered foci of restricted diffusion are present in the right temporal and occipital lobe along a watershed distribution. A more remote right occipital pole infarct is present. No hemorrhage is present. T2 signal changes are present within the infarct areas. There is moderate diffuse T2 signal  change or bilaterally. Advanced atrophy is present. Dilated perivascular spaces are present in the corona radiata bilaterally. White matter changes extend into the brainstem. Vascular: Abnormal signal is present in the left internal carotid artery to the cavernous segment. This is consistent with the known occlusion. Right ICA flow and posterior circulation flow is present. Skull and upper cervical spine: The skull base is normal. The craniocervical junction is normal. There is slight retrolisthesis at C3-4 with disc disease at C3-4 and C4-5. Sinuses/Orbits:  The paranasal sinuses are clear. Bilateral mastoid effusions are present. No obstructing nasopharyngeal lesion is present. Globes and orbits are within normal limits. MRA HEAD FINDINGS Left ICA occlusion is noted. There is marked decreased signal in the reconstituted right IC left ICA terminus. Decreased signal extends into the left a 2 segment with significant signal loss in left MCA vessels. Right A1 scratched at the right internal carotid artery is within normal limits. There is a mild to moderate stenosis at the anterior genu. The right ICA terminus is normal. A1 and M1 segments are normal. There is some attenuation of distal MCA branch vessels without a significant proximal occlusion. The left vertebral artery is the dominant vessel. Left PICA origin is visualized. The right PICA is not visualized. Basilar artery is normal. Both posterior cerebral arteries originate from the basilar tip. PCA branch vessels are within normal limits. IMPRESSION: 1. Scattered foci of acute/subacute cortical infarcts involving the right frontal, parietal, temporal, and occipital lobes. These may be due to proximal emboli. Watershed infarcts are also considered. 2. More remote cortical infarct involves the right occipital pole. 3. Advanced white matter disease reflects underlying microvascular ischemia. 4. 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 Decreased flow to the left ICA terminus, left ACA, and left MCA vessels. 5. Mild to moderate narrowing at the genu of the cavernous right internal carotid artery. 6. No other significant proximal stenosis on the right to account for the areas of infarction. No emergent large vessel occlusion on the right. 7. The posterior circulation is unremarkable. Electronically Signed   By: San Morelle M.D.   On: 12/28/2017 16:48   Dg Chest Port 1 View  Result Date: 12/28/2017 CLINICAL DATA:  Acute ischemic stroke. EXAM: PORTABLE CHEST 1  VIEW COMPARISON:  09/04/2017 FINDINGS: Shallow inspiration. Heart size and pulmonary vascularity are normal. Minimal linear atelectasis in the lung bases. No focal consolidation or edema. No blunting of costophrenic angles. No pneumothorax. Mediastinal contours appear intact. Surgical clips in the base of the neck. Degenerative changes in the spine and shoulders. Old rib fractures. IMPRESSION: Shallow inspiration with minimal linear atelectasis in the lung bases. No focal consolidation or edema. Electronically Signed   By: Lucienne Capers M.D.   On: 12/28/2017 21:16   Mr Jodene Nam Head Wo Contrast  Result Date: 12/28/2017 CLINICAL DATA:  Acute onset of right-sided weakness. Previous infarcts. Diabetes. Acute onset of symptoms beginning 10 hours ago. EXAM: MRI HEAD WITHOUT CONTRAST MRA HEAD WITHOUT CONTRAST TECHNIQUE: Multiplanar, multiecho pulse sequences of the brain and surrounding structures were obtained without intravenous contrast. Angiographic images of the head were obtained using MRA technique without contrast. COMPARISON:  CT head without contrast from the same day. Cerebral arteriogram from the same day. FINDINGS: MRI HEAD FINDINGS Brain: The diffusion-weighted images demonstrate areas of acute cortical infarction involving the right middle frontal gyrus and the precentral gyrus. There are areas of involvement in the right parietal lobe in occluding the right postcentral gyrus. Scattered  foci of restricted diffusion are present in the right temporal and occipital lobe along a watershed distribution. A more remote right occipital pole infarct is present. No hemorrhage is present. T2 signal changes are present within the infarct areas. There is moderate diffuse T2 signal change or bilaterally. Advanced atrophy is present. Dilated perivascular spaces are present in the corona radiata bilaterally. White matter changes extend into the brainstem. Vascular: Abnormal signal is present in the left internal carotid  artery to the cavernous segment. This is consistent with the known occlusion. Right ICA flow and posterior circulation flow is present. Skull and upper cervical spine: The skull base is normal. The craniocervical junction is normal. There is slight retrolisthesis at C3-4 with disc disease at C3-4 and C4-5. Sinuses/Orbits: The paranasal sinuses are clear. Bilateral mastoid effusions are present. No obstructing nasopharyngeal lesion is present. Globes and orbits are within normal limits. MRA HEAD FINDINGS Left ICA occlusion is noted. There is marked decreased signal in the reconstituted right IC left ICA terminus. Decreased signal extends into the left a 2 segment with significant signal loss in left MCA vessels. Right A1 scratched at the right internal carotid artery is within normal limits. There is a mild to moderate stenosis at the anterior genu. The right ICA terminus is normal. A1 and M1 segments are normal. There is some attenuation of distal MCA branch vessels without a significant proximal occlusion. The left vertebral artery is the dominant vessel. Left PICA origin is visualized. The right PICA is not visualized. Basilar artery is normal. Both posterior cerebral arteries originate from the basilar tip. PCA branch vessels are within normal limits. IMPRESSION: 1. Scattered foci of acute/subacute cortical infarcts involving the right frontal, parietal, temporal, and occipital lobes. These may be due to proximal emboli. Watershed infarcts are also considered. 2. More remote cortical infarct involves the right occipital pole. 3. Advanced white matter disease reflects underlying microvascular ischemia. 4. 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 Decreased flow to the left ICA terminus, left ACA, and left MCA vessels. 5. Mild to moderate narrowing at the genu of the cavernous right internal carotid artery. 6. No other significant proximal stenosis on the right  to account for the areas of infarction. No emergent large vessel occlusion on the right. 7. The posterior circulation is unremarkable. Electronically Signed   By: San Morelle M.D.   On: 12/28/2017 16:48   Ct Head Code Stroke Wo Contrast  Result Date: 12/28/2017 CLINICAL DATA:  Code stroke. Acute onset of left-sided weakness and right-sided gaze. Focal neuro deficit of less than 6 hours. EXAM: CT HEAD WITHOUT CONTRAST TECHNIQUE: Contiguous axial images were obtained from the base of the skull through the vertex without intravenous contrast. COMPARISON:  CT head without contrast 10/21/2009. FINDINGS: Brain: Mild atrophy and moderate white matter disease has progressed since the prior exam. No acute anterior circulation infarct is present. Asymmetric ischemic changes of the right basal ganglia are stable. A new right occipital pole infarct is present. This is age indeterminate. No acute hemorrhage or mass lesion is present. The brainstem and cerebellum are normal. Ventricles are proportionate to the degree of atrophy. No significant extra-axial fluid collection is present. Vascular: Atherosclerotic calcifications are present in the cavernous internal carotid arteries. No significant asymmetric hyperdensity is evident. Skull: Calvarium is intact. No focal lytic or blastic lesions are present. No significant extracranial soft tissue lesion is present. Sinuses/Orbits: The paranasal sinuses and mastoid air cells are clear. ASPECTS [facility]  Stroke Program Early CT Score) - Ganglionic level infarction (caudate, lentiform nuclei, internal capsule, insula, M1-M3 cortex): 7/7 - Supraganglionic infarction (M4-M6 cortex): 3/3 Total score (0-10 with 10 being normal): 10/10 IMPRESSION: 1. Right occipital lobe infarct is new since the prior exam. This is age indeterminate. Acute infarct is not excluded. 2. No acute anterior circulation infarct. 3. Stable remote ischemic changes of the basal ganglia, right greater than  left. 4. Progressive atrophy and white matter disease. This likely reflects the sequela of chronic microvascular ischemia. 5. Atherosclerotic changes without a hyperdense vessel. 6. ASPECTS is 10/10 These results were called by telephone at the time of interpretation on 12/28/2017 at 8:40am to Dr. Rory Percy, who verbally acknowledged these results. Electronically Signed   By: San Morelle M.D.   On: 12/28/2017 08:49   DSA RT CFA appeoach.Findings. 1.No occlusions,stenosis,or filling defects or dissections involving RT anterior circulation. 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 3.Occluded  Lt ICA prox, with modest reconstitution of Lt ICA cavernous seg and Lt MCA and ACA from Lt ECA branches via  ophthalmic  Artery.  TTE  - Left ventricle: The cavity size was normal. There was moderate   concentric hypertrophy. Systolic function was vigorous. The   estimated ejection fraction was in the range of 65% to 70%. Wall   motion was normal; there were no regional wall motion   abnormalities. There was an increased relative contribution of   atrial contraction to ventricular filling. Doppler parameters are   consistent with abnormal left ventricular relaxation (grade 1   diastolic dysfunction). Doppler parameters are consistent with   high ventricular filling pressure. - Aortic valve: Mild focal calcification involving the right   coronary cusp. - Mitral valve: Calcified annulus.  TCD Bubble study : positive for trivial right-to-left shunt with Valsalva only which is clinically insignificant  PHYSICAL EXAM  Temp:  [97.8 F (36.6 C)-99.9 F (37.7 C)] 99.9 F (37.7 C) (10/15 1117) Pulse Rate:  [73-95] 80 (10/15 1117) Resp:  [16-20] 20 (10/15 1117) BP: (134-157)/(46-56) 134/46 (10/15 1117) SpO2:  [96 %-100 %] 99 % (10/15 1117)  General - frail elderly caucasian lady, in no apparent distress.  Ophthalmologic - fundi not visualized due to noncooperation.  Cardiovascular - Regular rate and rhythm,  not in afib.  Mental Status -  Level of arousal and orientation to time, place, and person were intact. Language including expression, naming, repetition, comprehension was assessed and found intact.    Cranial Nerves II - XII - II - Visual field intact OU III, IV, VI - Extraocular movements intact.  V - Facial sensation intact bilaterally. VII - left nasolabial fold mild flattening. VIII - Hearing & vestibular intact bilaterally. X - Palate elevates symmetrically. XI - Chin turning & shoulder shrug intact bilaterally. XII - Tongue protrusion intact.  Motor Strength - The patient's strength was 4/5 LUE with weak left grip, 4+/5 RUE, LLE 4/5 proximal and 5/5 distal, RLE 4+/5 proximal and 5/5 distal.  Bulk was normal and fasciculations were absent.   Motor Tone - Muscle tone was assessed at the neck and appendages and was normal.  Reflexes - The patient's reflexes were symmetrical in all extremities and she had no pathological reflexes.  Sensory - Light touch, temperature/pinprick were assessed and were decreased on the left, about 50% of the right.     Coordination - The patient had normal movements in the right hands with no ataxia or dysmetria, not able to test left hand due to weakness.  Tremor was absent.  Gait and Station - deferred.   ASSESSMENT/PLAN Ms. Catherine Greene is a 77 y.o. female with history of CAD, DM, HTN, HLD, hx of PE on eliquis, left ICA chronic occlusion, right CEA, and strokes with BLE weakness walking with cane at baseline admitted for left sided weakness and right gaze. No tPA given due to on eliquis.    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 Decreased flow to the left ICA terminus, left ACA, and left MCA vessels  DSA - Occluded  Lt ICA prox, with modest  reconstitution of Lt ICA cavernous seg and Lt MCA and ACA from Lt ECA branches via  ophthalmic  Artery  2D Echo  EF 65-70%  LDL 107  HgbA1c 5.7  Heparin subq for VTE prophylaxis  NPO for now  aspirin 81 mg daily and Eliquis (apixaban) daily prior to admission, now on ASA 81mg  and eliquis 5mg  bid.  Patient counseled to be compliant with her antithrombotic medications  Ongoing aggressive stroke risk factor management  Therapy recommendations:  CIR  Disposition:  Pending  Hx of PE on eliquis  As per med list, she is on eliquis 5mg  bid and ASA 81 at home  no PAF documented afib in pt note  Pt stated that she is on eliquis due to hx of PE  Resumed eliquis 5mg  bid - given current embolic pattern stroke, will continue eliquis  Carotid stenosis/occlusion  Right ICA CEA in the past  Left ICA chronic occlusion with decreased from left tICA, MCA and ACA  On ASA 81 and eliquis at home  Now resumed ASA 50  Continue eliquis   Diabetes  HgbA1c 5.7 goal < 7.0  Controlled  Home meds - glipizide and metformin  Metformin resumed, glipizide on hold   No significant hyperglycemia  CBG monitoring  SSI  close PCP follow up  Hypertension . Stable on the high end . Resumed norvasc, losartan and metoprolol  Long term BP goal 130-160 given left ICA occlusion with decreased flow at left MCA, ACA, tICA  Hyperlipidemia  Home meds:  pravastatin 40  LDL 107, goal < 70  Now on pravastatin 80  Continue statin at discharge  Other Stroke Risk Factors  Advanced age  Coronary artery disease  Other Active Problems  Elevated TG  Chronic diarrhea - PRN home lomotil - d/c laxatives   Chronic LBP and neck pain - tylenol PRN  Chronic HA - likely due to neck pain - fioricet PRN     Hospital day # 6 I have personally examined this patient, reviewed notes, independently viewed imaging studies, participated in medical decision making and plan of care.ROS completed by  me personally and pertinent positives fully documented  I have made any additions or clarifications directly to the above note. Plan  Check UA, CBC and CMP panel labs. Patient medically stable for transfer to inpatient rehabilitation when bed available. Long discussion with patient and family at the bedside and answered questions. Greater than 50% time during this 25 minute visit was spent on counseling and coordination of care about her embolic stroke and discussion about plan of care and answered questions. Antony Contras, MD Medical Director [facility] Pager: [phone] 01/03/2018 12:16 PM        To contact Stroke Continuity provider,  please refer to [email or web address] After hours, contact General Neurology

## 2018-01-03 NOTE — Progress Notes (Signed)
  Speech Language Pathology Treatment: Cognitive-Linquistic  Patient Details Name: Annalyse Langlais MRN: [id number] DOB: 03/07/1941 Today's Date: 01/03/2018 Time: 7092-9574 SLP Time Calculation (min) (ACUTE ONLY): 11 min  Assessment / Plan / Recommendation Clinical Impression  Pt participated in menu reading task, requiring Max cues to find items on the left side side of each page and to find the entire left page. Pt kept reporting that she "can't see" but could easily read items on the right side of the right page. Mod cues were given for working memory and selective attention. Min cues were provided for increased breath support to maximize volume and therefore intelligibility of speech. Recommend CIR for f/u post-d/c.   HPI HPI: Pt is a 77 y/o female with pmh significant for CAD, COPD, DM, 2 strokes with residual weakness in both LE's, admitted with worsening left-sided weakness and right gaze preference.  MRI showed scattered infarcts involving the R fontal, parietal, temporal and occipital lobes.      SLP Plan  Continue with current plan of care       Recommendations                   Follow up Recommendations: Inpatient Rehab SLP Visit Diagnosis: Dysarthria and anarthria (R47.1);Cognitive communication deficit (B34.037) Plan: Continue with current plan of care       GO                Germain Osgood 01/03/2018, 10:22 AM  Germain Osgood, M.A. [place] [facility] Environmental education officer [phone] Office [phone]

## 2018-01-03 NOTE — PMR Pre-admission (Signed)
PMR Admission Coordinator Pre-Admission Assessment  Patient: Catherine Greene is an 77 y.o., female MRN: [id number] DOB: 27-Dec-1940 Height:   Weight:                Insurance Information HMO: yes    PPO:      PCP:      IPA:      80/20:      OTHER: medicare advantage plan PRIMARY: [employer] Medicare      Policy#: [id number]      Subscriber: pt CM Name: Anderson Malta      Phone#: [phone]     Fax#: [phone] Pre-Cert#: [id number]  Approved for 7 days with f/u Dorthula Nettles phone [phone] fax [phone]    Employer: retired Benefits:  Phone #: [phone]     Name: 01/03/2018 Eff. Date: 03/22/2017     Deduct: none      Out of Pocket Max: $4400      Life Max: none CIR: $345 co pay per day days 1 until 5      SNF: no co pay days 1 until 20: $160 co pay per day days 21 until 48; no co pay days 49 until 100 Outpatient: $40 co pay per visit     Co-Pay: visits per medical neccesity Home Health: 100%      Co-Pay: visits per medical neccesity DME: 80%     Co-Pay: 20% Providers: in network  SECONDARY: none        Medicaid Application Date:       Case Manager:  Disability Application Date:       Case Worker:   Emergency Facilities manager Information    Name Relation Home Work Mobile   [place],Kathy Daughter [phone]  [phone]   No name specified         Current Medical History  Patient Admitting Diagnosis: right MCA infarct  History of Present Illness:HPI: Catherine Greene is a 77 year old right-handed female with history of CAD, COPD with remote tobacco abuse, diabetes mellitus, 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  Presented 12/28/2017 with complaints of left-sided weakness.  Cranial CT scan showed right occipital lobe infarction new since prior exam.  No acute anterior circulation infarction.  MRI/MRA showed scattered foci of acute subacute cortical infarcts  involving the right frontal, parietal, temporal and occipital lobes.  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  Echocardiogram with ejection fraction of 70% no wall motion abnormalities grade 1 diastolic dysfunction.  Underwent partial revascularization of left ICA terminus per interventional radiology.  TCD bubble study completed clinically insignificant findings of trivial PFO.  Neurology follow-up currently maintained on aspirin as well as Eliquis for CVA prophylaxis.  Tolerating a regular diet.    Complete NIHSS TOTAL: 3    Past Medical History  Past Medical History:  Diagnosis Date  . CAD (coronary artery disease)   . Diabetes mellitus    type 2  . Hyperlipidemia   . Hypertension   . Past heart attack 1998    Family History  family history includes Bone cancer (age of onset: 66) in her mother; Heart failure (age of onset: 67) in her father.  Prior Rehab/Hospitalizations:  Has the patient had major surgery during 100 days prior to admission? No  Current Medications   Current Facility-Administered Medications:  .  acetaminophen (TYLENOL) tablet 650 mg, 650 mg, Oral, Q4H PRN,  650 mg at 01/02/18 1814 **OR** [DISCONTINUED] acetaminophen (TYLENOL) solution 650 mg, 650 mg, Per Tube, Q4H PRN **OR** [DISCONTINUED] acetaminophen (TYLENOL) suppository 650 mg, 650 mg, Rectal, Q4H PRN, Amie Portland, MD, 650 mg at 12/28/17 1949 .  amLODipine (NORVASC) tablet 10 mg, 10 mg, Oral, Daily, Rosalin Hawking, MD, 10 mg at 01/03/18 0956 .  apixaban (ELIQUIS) tablet 5 mg, 5 mg, Oral, BID, Rosalin Hawking, MD, 5 mg at 01/03/18 0955 .  aspirin EC tablet 81 mg, 81 mg, Oral, Daily, Rosalin Hawking, MD, 81 mg at 01/03/18 0955 .  busPIRone (BUSPAR) tablet 15 mg, 15 mg, Oral, BID, Rosalin Hawking, MD, 15 mg at 01/03/18 0956 .  butalbital-acetaminophen-caffeine (FIORICET, ESGIC) 50-325-40 MG per tablet 1 tablet, 1 tablet, Oral, Q12H PRN, Rosalin Hawking, MD, 1 tablet at 01/01/18  2219 .  diphenoxylate-atropine (LOMOTIL) 2.5-0.025 MG per tablet 1 tablet, 1 tablet, Oral, QID PRN, Rosalin Hawking, MD, 1 tablet at 01/02/18 2831 .  gabapentin (NEURONTIN) capsule 400 mg, 400 mg, Oral, Daily, Amie Portland, MD, 400 mg at 01/03/18 0956 .  insulin aspart (novoLOG) injection 0-9 Units, 0-9 Units, Subcutaneous, TID WC, Amie Portland, MD, 1 Units at 01/03/18 1229 .  losartan (COZAAR) tablet 100 mg, 100 mg, Oral, Daily, Rosalin Hawking, MD, 100 mg at 01/03/18 0956 .  MEDLINE mouth rinse, 15 mL, Mouth Rinse, BID, Amie Portland, MD, 15 mL at 01/03/18 1001 .  metFORMIN (GLUCOPHAGE) tablet 1,000 mg, 1,000 mg, Oral, BID WC, Rosalin Hawking, MD, 1,000 mg at 01/03/18 0820 .  metoprolol tartrate (LOPRESSOR) tablet 100 mg, 100 mg, Oral, BID, Rosalin Hawking, MD, 100 mg at 01/03/18 0955 .  oxyCODONE-acetaminophen (PERCOCET/ROXICET) 5-325 MG per tablet 1 tablet, 1 tablet, Oral, Q6H PRN, Donzetta Starch, NP, 1 tablet at 01/03/18 1136 .  pravastatin (PRAVACHOL) tablet 80 mg, 80 mg, Oral, Daily, Rosalin Hawking, MD, 80 mg at 01/03/18 0955 .  sertraline (ZOLOFT) tablet 100 mg, 100 mg, Oral, Daily, Rosalin Hawking, MD, 100 mg at 01/03/18 0955 .  sodium chloride flush (NS) 0.9 % injection 3 mL, 3 mL, Intravenous, Q12H, Rosalin Hawking, MD, 3 mL at 01/03/18 1003 .  sodium chloride flush (NS) 0.9 % injection 3 mL, 3 mL, Intravenous, PRN, Rosalin Hawking, MD  Patients Current Diet:  Diet Order            Diet heart healthy/carb modified Room service appropriate? Yes; Fluid consistency: Thin  Diet effective now              Precautions / Restrictions Precautions Precautions: Fall Restrictions Weight Bearing Restrictions: No   Has the patient had 2 or more falls or a fall with injury in the past year?No  Prior Activity Level Community (5-7x/wk): declining health since June, 2019. Was Mod I with cane over past couple months  [place] Devices/Equipment: Radio producer (specify quad or  straight) Home Equipment: Cane - single point, Bedside commode  Prior Device Use: Indicate devices/aids used by the patient prior to current illness, exacerbation or injury? cane  Prior Functional Level Prior Function Level of Independence: Independent with assistive device(s) Comments: pt did have a gait disturbance, but independent in the home with/without AD and cane use outdoors  Self Care: Did the patient need help bathing, dressing, using the toilet or eating?  Independent  Indoor Mobility: Did the patient need assistance with walking from room to room (with or without device)? Independent  Stairs: Did the patient need assistance with internal or external stairs (with or  without device)? Needed some help  Functional Cognition: Did the patient need help planning regular tasks such as shopping or remembering to take medications? Needed some help  Current Functional Level Cognition  Arousal/Alertness: Awake/alert Overall Cognitive Status: Impaired/Different from baseline Orientation Level: Oriented X4 Following Commands: Follows one step commands inconsistently, Follows multi-step commands inconsistently General Comments: Pt requiring increased time and cues during session. Very motivated to participate in therapy Attention: Sustained, Selective Sustained Attention: Appears intact Selective Attention: Appears intact Memory: Impaired Memory Impairment: Decreased short term memory, Decreased recall of new information, Other (comment)(working memory) Decreased Short Term Memory: Verbal basic, Functional basic Awareness: Impaired Awareness Impairment: Emergent impairment, Anticipatory impairment Executive Function: Reasoning, Sequencing Reasoning: Impaired Reasoning Impairment: Verbal basic Sequencing: Impaired Sequencing Impairment: Verbal complex Safety/Judgment: Impaired(decreased awareness of L visual field)    Extremity Assessment (includes Sensation/Coordination)   Upper Extremity Assessment: LUE deficits/detail RUE Deficits / Details: demonstrates decr shoulder flexion and AROM to 80 degrees. pt noted to have incr efforts at 80 degrees, bicep and tricep 4 out 5 MMT LUE Deficits / Details: Decreased shoulder flexion to ~30 degrees. Poor finger flexion/extension, grasp strength, and dexerity. Pt with decreased initation of movement at LUE LUE Coordination: decreased fine motor, decreased gross motor  Lower Extremity Assessment: Defer to PT evaluation    ADLs  Overall ADL's : Needs assistance/impaired Eating/Feeding: Minimal assistance, Sitting Grooming: Moderate assistance, Wash/dry hands, Standing Grooming Details (indicate cue type and reason): Pt performing hand hygiene at sink with Mod A for standing balance. Requiring Min A to faciltiate normalized movement of LUE when reaching (i.e. for soap or turn on faucet.) Upper Body Bathing: Moderate assistance Lower Body Bathing: Maximal assistance Lower Body Dressing: Moderate assistance, Sit to/from stand Lower Body Dressing Details (indicate cue type and reason): Pt bending forward to pull up socks. using RUE only and not initating LUE towards socks. Mod A for standing balance Toilet Transfer: Minimal assistance, +2 for physical assistance, Ambulation, BSC Toilet Transfer Details (indicate cue type and reason): Min A +2 for power up into standing and then to guide to [facility]. Pt presenting with weakness and decreased balance Toileting- Clothing Manipulation and Hygiene: Moderate assistance, Sit to/from stand Toileting - Clothing Manipulation Details (indicate cue type and reason): Mod A for maintaining balance and then pt performing peri care Functional mobility during ADLs: Moderate assistance, +2 for physical assistance General ADL Comments: Pt highly motivated. Continues to present with decreased balance, strength, vision, and functional use of LUE.    Mobility  Overal bed mobility: Needs Assistance Bed  Mobility: Rolling, Sit to Sidelying Rolling: Min guard Supine to sit: Min guard Sit to sidelying: Min assist General bed mobility comments: assist to elevate LEs back to bed    Transfers  Overall transfer level: Needs assistance Equipment used: Rolling walker (2 wheeled) Transfers: Sit to/from Stand Sit to Stand: Min assist Stand pivot transfers: Min assist General transfer comment: Vcs for hand placement and postioing, min assist to power up to standing in increased time to power from varying surfaces    Ambulation / Gait / Stairs / Wheelchair Mobility  Ambulation/Gait Ambulation/Gait assistance: Mod assist, +2 physical assistance Gait Distance (Feet): 20 Feet Assistive device: 2 person hand held assist Gait Pattern/deviations: Step-through pattern, Decreased stride length, Decreased weight shift to left, Trunk flexed, Drifts right/left General Gait Details: patient with noted posterior instability and left sided fatigue during mobility.     Posture / Balance Dynamic Sitting Balance Sitting balance - Comments: needing external assist  Balance Overall balance assessment: Needs assistance Sitting-balance support: Feet supported Sitting balance-Leahy Scale: Fair Sitting balance - Comments: needing external assist Standing balance support: Bilateral upper extremity supported Standing balance-Leahy Scale: Poor Standing balance comment: reliant on external assist for stability due to posterior bias    Special needs/care consideration BiPAP/CPAP n/a CPM n/a Continuous Drip IV n/a Dialysis n/a Life Vest n/a Oxygen n/a Special Bed n/a Trach Size n/a Wound Vac n/a Skin right and left femoral caths sites with dressing; ecchymosis to right thigh Bowel mgmt: continent LBM 10/15 Bladder mgmt: continent Diabetic mgmt Hgb A1c 5.7   Previous Home Environment Living Arrangements: (boyfriend, Gwyndolyn Saxon "[employer]" with her 24/7, daughter Juliann Pulse v)  Lives With: Significant other,  Daughter Available Help at Discharge: Family, Available 24 hours/day Type of Home: Mobile home Home Layout: One level Home Access: Stairs to enter Entrance Stairs-Rails: Right, Left Entrance Stairs-Number of Steps: several Bathroom Shower/Tub: Multimedia programmer: Standard Bathroom Accessibility: Yes How Accessible: Accessible via walker Home Care Services: (has used [facility] in the past)  Discharge Living Setting Plans for Discharge Living Setting: Patient's home, Mobile Home, Lives with (comment)(daughter and pt's boyfriend) Type of Home at Discharge: Mobile home Discharge Home Layout: One level Discharge Home Access: Stairs to enter Entrance Stairs-Rails: Right, Left Entrance Stairs-Number of Steps: several Discharge Bathroom Shower/Tub: Walk-in shower Discharge Bathroom Toilet: Standard Discharge Bathroom Accessibility: Yes How Accessible: Accessible via walker Does the patient have any problems obtaining your medications?: No  Social/Family/Support Systems Patient Roles: Partner, Parent Contact Information: Juliann Pulse, daughter requests to be first contact Anticipated Caregiver: daughter and pt's boyfriend Anticipated Caregiver's Contact Information: see above Ability/Limitations of Caregiver: none Caregiver Availability: 24/7 Discharge Plan Discussed with Primary Caregiver: Yes Is Caregiver In Agreement with Plan?: Yes Does Caregiver/Family have Issues with Lodging/Transportation while Pt is in Rehab?: No  Goals/Additional Needs Patient/Family Goal for Rehab: supervision PT, OT, and SLP Expected length of stay: ELOS 7 to 10 days Pt/Family Agrees to Admission and willing to participate: Yes Program Orientation Provided & Reviewed with Pt/Caregiver Including Roles  & Responsibilities: Yes  Decrease burden of Care through IP rehab admission: n/a  Possible need for SNF placement upon discharge: not anticipated  Patient Condition: This patient's medical  and functional status has changed since the consult dated: 01/01/2018  in which the Rehabilitation Physician determined and documented that the patient's condition is appropriate for intensive rehabilitative care in an inpatient rehabilitation facility. See "History of Present Illness" (above) for medical update. Functional changes are: overall min to mod assist. Patient's medical and functional status update has been discussed with the Rehabilitation physician and patient remains appropriate for inpatient rehabilitation. Will admit to inpatient rehab today.  Preadmission Screen Completed By:  Cleatrice Burke, 01/03/2018 1:49 PM ______________________________________________________________________   Discussed status with Dr. Naaman Plummer on 01/03/2018 at  1357 and received telephone approval for admission today.  Admission Coordinator:  Cleatrice Burke, time 2202 Date 01/03/2018

## 2018-01-03 NOTE — Progress Notes (Signed)
Inpatient Rehabilitation Admissions Coordinator  I have insurance approval and bed available to admit pt to rehab today. I met with pt and her boyfriend at bedside as well as spoke with her daughter, Kathy, by phone. They are in agreement. I contacted Sharon Biby, GNP, RN CM and SW. I will make the arrangements to admit today.   , RN, MSN Rehab Admissions Coordinator [phone] 01/03/2018 1:03 PM  

## 2018-01-03 NOTE — Discharge Summary (Addendum)
Stroke Discharge Summary  Patient ID: Zeva Leber   MRN: [id number]      DOB: 11/09/1940  Date of Admission: 12/28/2017 Date of Discharge: 01/03/2018  Attending Physician:  Garvin Fila, MD, Stroke MD Consultant(s):   Alysia Penna, MD (Physical Medicine & Rehabtilitation)  Patient's PCP:  Coy Saunas, MD  Discharge Diagnoses:  Principal Problem:   Acute ischemic stroke [facility])- embolic of cryptogenic etiology Active Problems:   Diabetes mellitus, type II ([place])   Essential hypertension   CAD (coronary artery disease), native coronary artery   Back pain   Diarrhea   Carotid occlusion, left   Hyperlipidemia   Chronic pulmonary embolism (HCC)   Carotid stenosis, right  Past Medical History:  Diagnosis Date  . CAD (coronary artery disease)   . Diabetes mellitus    type 2  . Hyperlipidemia   . Hypertension   . Past heart attack 1998   Past Surgical History:  Procedure Laterality Date  . APPENDECTOMY    . BACK SURGERY     x 2  . CESAREAN SECTION     x 3  . IR ANGIO INTRA EXTRACRAN SEL COM CAROTID INNOMINATE BILAT MOD SED  12/28/2017  . IR ANGIO VERTEBRAL SEL SUBCLAVIAN INNOMINATE UNI R MOD SED  12/28/2017  . IR ANGIO VERTEBRAL SEL VERTEBRAL UNI L MOD SED  12/28/2017  . NECK SURGERY    . RADIOLOGY WITH ANESTHESIA N/A 12/28/2017   Procedure: IR WITH ANESTHESIA;  Surgeon: Luanne Bras, MD;  Location: [place];  Service: Radiology;  Laterality: N/A;  . Skin cancer removal face    . TONSILLECTOMY      Medications to be continued on Rehab Allergies as of 01/03/2018      Reactions   Codeine    REACTION: sickness   Contrast Media [iodinated Diagnostic Agents]    Metrizamide Other (See Comments)   Upset stomach   Morphine    REACTION: sickness      Medication List    TAKE these medications   amLODipine 10 MG tablet Commonly known as:  NORVASC Take 10 mg by mouth daily.   aspirin 81 MG EC tablet Take 1 tablet (81 mg total) by mouth daily. Start taking  on:  01/04/2018   busPIRone 15 MG tablet Commonly known as:  BUSPAR Take 15 mg by mouth 2 (two) times daily.   butalbital-acetaminophen-caffeine 50-325-40 MG tablet Commonly known as:  FIORICET, ESGIC Take 1 tablet by mouth every 12 (twelve) hours as needed for headache or migraine.   diphenoxylate-atropine 2.5-0.025 MG tablet Commonly known as:  LOMOTIL Take 1 tablet by mouth 4 (four) times daily as needed for diarrhea or loose stools.   ELIQUIS 5 MG Tabs tablet Generic drug:  apixaban Take 5 mg by mouth 2 (two) times daily.   Fish Oil 1000 MG Caps Take 2,000 mg by mouth daily.   gabapentin 400 MG capsule Commonly known as:  NEURONTIN Take 400 mg by mouth every evening.   glipiZIDE 5 MG tablet Commonly known as:  GLUCOTROL Take 5 mg by mouth 2 (two) times daily before a meal.   insulin aspart 100 UNIT/ML injection Commonly known as:  novoLOG Inject 0-9 Units into the skin 3 (three) times daily with meals.   losartan 100 MG tablet Commonly known as:  COZAAR Take 100 mg by mouth daily.   magnesium oxide 400 MG tablet Commonly known as:  MAG-OX Take 400 mg by mouth daily.   metFORMIN 1000  MG tablet Commonly known as:  GLUCOPHAGE Take 1,000 mg by mouth 2 (two) times daily with a meal.   metoprolol tartrate 100 MG tablet Commonly known as:  LOPRESSOR Take 1 tablet (100 mg total) by mouth 2 (two) times daily. What changed:    medication strength  how much to take   oxyCODONE-acetaminophen 5-325 MG tablet Commonly known as:  PERCOCET/ROXICET Take 1 tablet by mouth every 6 (six) hours as needed for moderate pain (back pain).   pravastatin 80 MG tablet Commonly known as:  PRAVACHOL Take 1 tablet (80 mg total) by mouth daily. Start taking on:  01/04/2018 What changed:    medication strength  how much to take   ranitidine 150 MG tablet Commonly known as:  ZANTAC Take 150 mg by mouth 2 (two) times daily.   sertraline 100 MG tablet Commonly known as:   ZOLOFT Take 100 mg by mouth daily.       LABORATORY STUDIES CBC    Component Value Date/Time   WBC 13.3 (H) 01/03/2018 1225   RBC 3.98 01/03/2018 1225   HGB 11.3 (L) 01/03/2018 1225   HCT 35.6 (L) 01/03/2018 1225   PLT 183 01/03/2018 1225   MCV 89.4 01/03/2018 1225   MCH 28.4 01/03/2018 1225   MCHC 31.7 01/03/2018 1225   RDW 13.3 01/03/2018 1225   LYMPHSABS 1.0 01/03/2018 1225   MONOABS 1.9 (H) 01/03/2018 1225   EOSABS 0.3 01/03/2018 1225   BASOSABS 0.0 01/03/2018 1225   CMP    Component Value Date/Time   NA 139 01/01/2018 0555   K 3.5 01/01/2018 0555   CL 101 01/01/2018 0555   CO2 30 01/01/2018 0555   GLUCOSE 151 (H) 01/01/2018 0555   BUN 16 01/01/2018 0555   CREATININE 0.76 01/01/2018 0555   CALCIUM 9.4 01/01/2018 0555   PROT 6.6 12/28/2017 0827   ALBUMIN 3.9 12/28/2017 0827   AST 34 12/28/2017 0827   ALT 17 12/28/2017 0827   ALKPHOS 52 12/28/2017 0827   BILITOT 1.4 (H) 12/28/2017 0827   GFRNONAA >60 01/01/2018 0555   GFRAA >60 01/01/2018 0555   COAGS Lab Results  Component Value Date   INR 1.25 12/28/2017   Lipid Panel    Component Value Date/Time   CHOL 205 (H) 12/29/2017 0455   TRIG 350 (H) 12/30/2017 0337   HDL 37 (L) 12/29/2017 0455   CHOLHDL 5.5 12/29/2017 0455   VLDL 61 (H) 12/29/2017 0455   LDLCALC 107 (H) 12/29/2017 0455   HgbA1C  Lab Results  Component Value Date   HGBA1C 5.7 (H) 12/29/2017   Urinalysis No results found for: COLORURINE, APPEARANCEUR, LABSPEC, PHURINE, GLUCOSEU, HGBUR, BILIRUBINUR, [place], [place], [place], NITRITE, LEUKOCYTESUR Urine Drug Screen No results found for: [place], [place], [place], AMPHETMU, THCU, LABBARB   Alcohol Level    Component Value Date/Time   ETH <10 12/28/2017 0827    HISTORY OF PRESENT ILLNESS Toshiko Kemler is a 77 y.o. female past medical history of coronary artery disease, COPD, DM, 2 strokes in the past with residual weakness in both lower extremities using cane to walk,  right carotid endarterectomy, recent diagnosis of atrial fibrillation started on Eliquis last dose yesterday night, presented to the emergency room last known normal 6:30 AM 12/28/2017 while sitting on the toilet when she started noticing left-sided weakness.  EMS was called. Normal CBC and vitals per EMS.  On the way to the hospital, she started having right gaze preference, and worsening left-sided weakness. She was evaluated in the CT  scanner and had exam findings as detailed below consistent with right MCA syndrome. She was not administered IV tPA as she was on Eliquis with her last dose less than 12 hours ago. She is allergic to IV contrast hence a CTA was not performed.  She listed her allergy as passing out and having needed to be revived last time she got the contrast. Because of her severe allergy and the time required for prep, decision was made to take her straight for the thrombectomy because of the exam as well as noncontrast head CT-that showed no evidence of bleed, aspects 10 and possible evolving right cerebral hemispheric early hypodensity concerning for a right cerebral stroke. Family arrived later and was updated on the situation and explained the procedure by Dr. Estanislado Pandy prior to initiation of the procedure. Premorbid modified Rankin scale (mRS): 2.   HOSPITAL COURSE Ms. Ileane Sando is a 78 y.o. female with history of CAD, DM, HTN, HLD, hx of PE on eliquis, left ICA chronic occlusion, right CEA, and strokes with BLE weakness walking with cane at baseline admitted for left sided weakness and right gaze. No tPA given due to on eliquis. Angio without intervenable lesion. admitted to the ICU post procedure. Infarct felt to be embolic, though no source found. Was on eliquis d/t recent PE during the summer. Continue aspirin and eliquis for secondary stroke prevention. Had back pain PTA, resumed home percocet prn. Needs rehab, transferred to CIR.   Stroke:  right MCA scattered infarct embolic  on eliquis, source crptogenic  MRI  Right MCA scattered infarts  MRA Occluded left ICA with partial revascularization of the left ICA terminus. Decreased flow to the left ICA terminus, left ACA, and left MCA vessels  DSA - Occluded Lt ICA prox, with modest reconstitution of Lt ICA cavernous seg and Lt MCA and ACA from Lt ECA branches via ophthalmic Artery  2D Echo  EF 65-70%  LDL 107  HgbA1c 5.7  aspirin 81 mg daily and Eliquis (apixaban) daily prior to admission, resumed ASA 81mg  and eliquis 5mg  bid.  Therapy recommendations:  CIR  Disposition:  CIR  Hx of PE on eliquis  As per med list, she is on eliquis 5mg  bid and ASA 81 at home  no PAF / documented afib in pt note  Pt stated that she is on eliquis due to hx of PE during the summer  Resumed eliquis 5mg  bid - given current embolic pattern stroke, will continue eliquis  Carotid stenosis/occlusion  Right ICA CEA in the past  Left ICA chronic occlusion with decreased from left tICA, MCA and ACA  On ASA 81 and eliquis at home  Resumed ASA 81 and eliquis   Diabetes  HgbA1c 5.7 goal < 7.0  Controlled  Home meds - glipizide and metformin  Metformin resumed, glipizide on hold   No significant hyperglycemia  Hypertension  Stable 130-150s  Resumed norvasc, losartan and metoprolol  Long term BP goal 130-160 given left ICA occlusion with decreased flow at left MCA, ACA, tICA  Hyperlipidemia  Home meds:  pravastatin 40  LDL 107, goal < 70  Now on pravastatin 80  Continue statin at discharge  Other Stroke Risk Factors  Advanced age  Coronary artery disease  Other Active Problems  Elevated TG  Chronic diarrhea - PRN home lomotil - d/c laxatives   Chronic LBP and neck pain - tylenol PRN, percocet prn  Chronic HA - likely due to neck pain - fioricet PRN    SIGNIFICANT  DIAGNOSTIC STUDIES Mr Brain Wo Contrast  Result Date: 12/28/2017 CLINICAL DATA:  Acute onset of right-sided weakness.  Previous infarcts. Diabetes. Acute onset of symptoms beginning 10 hours ago. EXAM: MRI HEAD WITHOUT CONTRAST MRA HEAD WITHOUT CONTRAST TECHNIQUE: Multiplanar, multiecho pulse sequences of the brain and surrounding structures were obtained without intravenous contrast. Angiographic images of the head were obtained using MRA technique without contrast. COMPARISON:  CT head without contrast from the same day. Cerebral arteriogram from the same day. FINDINGS: MRI HEAD FINDINGS Brain: The diffusion-weighted images demonstrate areas of acute cortical infarction involving the right middle frontal gyrus and the precentral gyrus. There are areas of involvement in the right parietal lobe in occluding the right postcentral gyrus. Scattered foci of restricted diffusion are present in the right temporal and occipital lobe along a watershed distribution. A more remote right occipital pole infarct is present. No hemorrhage is present. T2 signal changes are present within the infarct areas. There is moderate diffuse T2 signal change or bilaterally. Advanced atrophy is present. Dilated perivascular spaces are present in the corona radiata bilaterally. White matter changes extend into the brainstem. Vascular: Abnormal signal is present in the left internal carotid artery to the cavernous segment. This is consistent with the known occlusion. Right ICA flow and posterior circulation flow is present. Skull and upper cervical spine: The skull base is normal. The craniocervical junction is normal. There is slight retrolisthesis at C3-4 with disc disease at C3-4 and C4-5. Sinuses/Orbits: The paranasal sinuses are clear. Bilateral mastoid effusions are present. No obstructing nasopharyngeal lesion is present. Globes and orbits are within normal limits. MRA HEAD FINDINGS Left ICA occlusion is noted. There is marked decreased signal in the reconstituted right IC left ICA terminus. Decreased signal extends into the left a 2 segment with  significant signal loss in left MCA vessels. Right A1 scratched at the right internal carotid artery is within normal limits. There is a mild to moderate stenosis at the anterior genu. The right ICA terminus is normal. A1 and M1 segments are normal. There is some attenuation of distal MCA branch vessels without a significant proximal occlusion. The left vertebral artery is the dominant vessel. Left PICA origin is visualized. The right PICA is not visualized. Basilar artery is normal. Both posterior cerebral arteries originate from the basilar tip. PCA branch vessels are within normal limits. IMPRESSION: 1. Scattered foci of acute/subacute cortical infarcts involving the right frontal, parietal, temporal, and occipital lobes. These may be due to proximal emboli. Watershed infarcts are also considered. 2. More remote cortical infarct involves the right occipital pole. 3. Advanced white matter disease reflects underlying microvascular ischemia. 4. 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 Decreased flow to the left ICA terminus, left ACA, and left MCA vessels. 5. Mild to moderate narrowing at the genu of the cavernous right internal carotid artery. 6. No other significant proximal stenosis on the right to account for the areas of infarction. No emergent large vessel occlusion on the right. 7. The posterior circulation is unremarkable. Electronically Signed   By: San Morelle M.D.   On: 12/28/2017 16:48   Dg Chest Port 1 View  Result Date: 12/28/2017 CLINICAL DATA:  Acute ischemic stroke. EXAM: PORTABLE CHEST 1 VIEW COMPARISON:  09/04/2017 FINDINGS: Shallow inspiration. Heart size and pulmonary vascularity are normal. Minimal linear atelectasis in the lung bases. No focal consolidation or edema. No blunting of costophrenic angles. No pneumothorax. Mediastinal contours appear intact. Surgical clips in the  base of the neck. Degenerative changes in the spine  and shoulders. Old rib fractures. IMPRESSION: Shallow inspiration with minimal linear atelectasis in the lung bases. No focal consolidation or edema. Electronically Signed   By: Lucienne Capers M.D.   On: 12/28/2017 21:16   Mr Jodene Nam Head Wo Contrast  Result Date: 12/28/2017 CLINICAL DATA:  Acute onset of right-sided weakness. Previous infarcts. Diabetes. Acute onset of symptoms beginning 10 hours ago. EXAM: MRI HEAD WITHOUT CONTRAST MRA HEAD WITHOUT CONTRAST TECHNIQUE: Multiplanar, multiecho pulse sequences of the brain and surrounding structures were obtained without intravenous contrast. Angiographic images of the head were obtained using MRA technique without contrast. COMPARISON:  CT head without contrast from the same day. Cerebral arteriogram from the same day. FINDINGS: MRI HEAD FINDINGS Brain: The diffusion-weighted images demonstrate areas of acute cortical infarction involving the right middle frontal gyrus and the precentral gyrus. There are areas of involvement in the right parietal lobe in occluding the right postcentral gyrus. Scattered foci of restricted diffusion are present in the right temporal and occipital lobe along a watershed distribution. A more remote right occipital pole infarct is present. No hemorrhage is present. T2 signal changes are present within the infarct areas. There is moderate diffuse T2 signal change or bilaterally. Advanced atrophy is present. Dilated perivascular spaces are present in the corona radiata bilaterally. White matter changes extend into the brainstem. Vascular: Abnormal signal is present in the left internal carotid artery to the cavernous segment. This is consistent with the known occlusion. Right ICA flow and posterior circulation flow is present. Skull and upper cervical spine: The skull base is normal. The craniocervical junction is normal. There is slight retrolisthesis at C3-4 with disc disease at C3-4 and C4-5. Sinuses/Orbits: The paranasal sinuses are  clear. Bilateral mastoid effusions are present. No obstructing nasopharyngeal lesion is present. Globes and orbits are within normal limits. MRA HEAD FINDINGS Left ICA occlusion is noted. There is marked decreased signal in the reconstituted right IC left ICA terminus. Decreased signal extends into the left a 2 segment with significant signal loss in left MCA vessels. Right A1 scratched at the right internal carotid artery is within normal limits. There is a mild to moderate stenosis at the anterior genu. The right ICA terminus is normal. A1 and M1 segments are normal. There is some attenuation of distal MCA branch vessels without a significant proximal occlusion. The left vertebral artery is the dominant vessel. Left PICA origin is visualized. The right PICA is not visualized. Basilar artery is normal. Both posterior cerebral arteries originate from the basilar tip. PCA branch vessels are within normal limits. IMPRESSION: 1. Scattered foci of acute/subacute cortical infarcts involving the right frontal, parietal, temporal, and occipital lobes. These may be due to proximal emboli. Watershed infarcts are also considered. 2. More remote cortical infarct involves the right occipital pole. 3. Advanced white matter disease reflects underlying microvascular ischemia. 4. 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 Decreased flow to the left ICA terminus, left ACA, and left MCA vessels. 5. Mild to moderate narrowing at the genu of the cavernous right internal carotid artery. 6. No other significant proximal stenosis on the right to account for the areas of infarction. No emergent large vessel occlusion on the right. 7. The posterior circulation is unremarkable. Electronically Signed   By: San Morelle M.D.   On: 12/28/2017 16:48   Ct Head Code Stroke Wo Contrast  Result Date: 12/28/2017 CLINICAL DATA:  Code stroke.  Acute onset of left-sided weakness and  right-sided gaze. Focal neuro deficit of less than 6 hours. EXAM: CT HEAD WITHOUT CONTRAST TECHNIQUE: Contiguous axial images were obtained from the base of the skull through the vertex without intravenous contrast. COMPARISON:  CT head without contrast 10/21/2009. FINDINGS: Brain: Mild atrophy and moderate white matter disease has progressed since the prior exam. No acute anterior circulation infarct is present. Asymmetric ischemic changes of the right basal ganglia are stable. A new right occipital pole infarct is present. This is age indeterminate. No acute hemorrhage or mass lesion is present. The brainstem and cerebellum are normal. Ventricles are proportionate to the degree of atrophy. No significant extra-axial fluid collection is present. Vascular: Atherosclerotic calcifications are present in the cavernous internal carotid arteries. No significant asymmetric hyperdensity is evident. Skull: Calvarium is intact. No focal lytic or blastic lesions are present. No significant extracranial soft tissue lesion is present. Sinuses/Orbits: The paranasal sinuses and mastoid air cells are clear. ASPECTS [facility] Stroke Program Early CT Score) - Ganglionic level infarction (caudate, lentiform nuclei, internal capsule, insula, M1-M3 cortex): 7/7 - Supraganglionic infarction (M4-M6 cortex): 3/3 Total score (0-10 with 10 being normal): 10/10 IMPRESSION: 1. Right occipital lobe infarct is new since the prior exam. This is age indeterminate. Acute infarct is not excluded. 2. No acute anterior circulation infarct. 3. Stable remote ischemic changes of the basal ganglia, right greater than left. 4. Progressive atrophy and white matter disease. This likely reflects the sequela of chronic microvascular ischemia. 5. Atherosclerotic changes without a hyperdense vessel. 6. ASPECTS is 10/10 These results were called by telephone at the time of interpretation on 12/28/2017 at 8:40am to Dr. Rory Percy, who verbally acknowledged these  results. Electronically Signed   By: San Morelle M.D.   On: 12/28/2017 08:49   Ir Angio Intra Extracran Sel Com Carotid Innominate Bilat Mod Sed  Result Date: 12/30/2017 CLINICAL DATA:  Left-sided hemiparesis, right gaze deviation and left-sided numbness. EXAM: BILATERAL COMMON CAROTID AND INNOMINATE ANGIOGRAPHY; IR ANGIO VERTEBRAL SEL SUBCLAVIAN INNOMINATE UNI RIGHT MOD SED; IR ANGIO VERTEBRAL SEL VERTEBRAL UNI LEFT MOD SED COMPARISON:  CT scan of the brain of 12/28/2017. MEDICATIONS: Heparin none; Ancef 2 g IV antibiotic was administered within 1 hour of the procedure. 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 ANESTHESIA/SEDATION: General anesthesia CONTRAST:  Isovue 300 approximately 60 mL FLUOROSCOPY TIME:  Fluoroscopy Time: 10 minutes 6 seconds (4726 mGy). COMPLICATIONS: None immediate. TECHNIQUE: Informed written consent was obtained from the patient after a thorough discussion of the procedural risks, benefits and alternatives. All questions were addressed. Maximal Sterile Barrier Technique was utilized including caps, mask, sterile gowns, sterile gloves, sterile drape, hand hygiene and skin antiseptic. A timeout was performed prior to the initiation of the procedure. The right groin was prepped and draped in the usual sterile fashion. Thereafter using modified Seldinger technique, transfemoral access into the right common femoral artery was obtained without difficulty. Over a 0.035 inch guidewire, a 5 French Pinnacle sheath was inserted. Through this, and also over 0.035 inch guidewire, a 5 [place] JB 1 catheter was advanced to the aortic arch region and selectively positioned in the right common carotid artery, the right subclavian artery, the left common carotid artery and the left vertebral artery. FINDINGS: The right subclavian arteriogram demonstrates the origin of the right vertebral artery to be mildly narrowed.  The vessel is, otherwise, seen to opacify to the cranial skull base.  Wide patency is seen of the right vertebrobasilar junction and the right posterior-inferior cerebellar artery. Also demonstrated are muscular branches from the distal right vertebral artery retrogradely opacifying the occipital artery and subsequently the right external carotid artery distribution. Opacification is noted into the basilar artery with the opacified portions of the posterior cerebral arteries, superior cerebellar arteries to be widely patent. Unopacified blood is seen in the basilar artery from the contralateral vertebral artery. The right common carotid arteriogram demonstrates a stump at the site of the occlusion of the right external carotid artery. The right internal carotid artery at the bulb to the cranial skull base demonstrates wide patency. The petrous, cavernous and supraclinoid segments demonstrate wide patency. A right posterior communicating artery infundibulum is seen with a vessel emanating at its apex. The right middle cerebral artery and the right anterior cerebral artery opacify into the capillary and venous phases. A prominent right anterior choroidal artery is also noted. The left common carotid arteriogram demonstrates mild narrowing at the origin of the left external carotid artery. Its branches are, otherwise, normally opacified. 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 There is moderate reconstitution of the left internal carotid artery in the cavernous segment via the ipsilateral enlarged ophthalmic artery from multiple branches emanating from the internal maxillary artery, and the nasolacrimal arteries. There is associated mild stenosis of the supraclinoid segment of the left internal carotid artery. Opacification into the left middle cerebral artery and the left anterior cerebral artery distributions is noted. The left vertebral artery origin is widely patent.  There is a mild tortuosity just distal to this. More distally, the vessel opacifies to the cranial skull base. Wide patency is seen of the left vertebrobasilar junction and the left posterior-inferior cerebellar artery. The basilar artery, the posterior cerebral arteries, the superior cerebellar arteries and the anterior-inferior cerebellar arteries demonstrate opacification into the capillary and venous phases. There are prominent leptomeningeal collaterals arising from the P3 segment of the left posterior cerebral artery, and also from the posterior temporal branch of the proximal posterior cerebral artery reconstitution partially the left middle cerebral artery distribution cortically and subcortically in its posterior 1/2. IMPRESSION: Angiographically no evidence of occlusions, stenosis, dissections or of intraluminal filling defects involving the right middle cerebral artery and the right anterior cerebral artery distributions. 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 Partial reconstitution of the right external carotid artery branches retroareolar via muscular branches arising from the distal right vertebral artery via the right occipital artery. 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 Mild-to-moderate reconstitution of the left middle cerebral artery posterior half cortical and subcortically from the P3 branches and the P1 P2 branches of the left posterior cerebral artery. PLAN: Patient extubated without difficulty without changes in neurological symptoms. Patient then transferred to neuro ICU for further stroke workup. Electronically Signed   By: Luanne Bras M.D.   On: 12/28/2017 12:38    Ir Angio Vertebral Sel Subclavian Innominate Uni R Mod Sed  Result Date: 12/30/2017 CLINICAL DATA:  Left-sided hemiparesis, right gaze deviation and left-sided numbness. EXAM: BILATERAL COMMON CAROTID AND INNOMINATE ANGIOGRAPHY; IR ANGIO VERTEBRAL SEL SUBCLAVIAN INNOMINATE UNI RIGHT MOD SED; IR ANGIO VERTEBRAL SEL VERTEBRAL UNI LEFT MOD SED COMPARISON:  CT scan  of the brain of 12/28/2017. MEDICATIONS: Heparin none; Ancef 2 g IV antibiotic was administered within 1 hour of the procedure. 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 ANESTHESIA/SEDATION: General anesthesia CONTRAST:  Isovue 300 approximately 60 mL FLUOROSCOPY TIME:  Fluoroscopy Time: 10 minutes 6 seconds (4726 mGy). COMPLICATIONS: None immediate. TECHNIQUE: Informed written consent was obtained from the patient after a thorough discussion of the procedural risks, benefits and alternatives. All questions were addressed. Maximal Sterile Barrier Technique was utilized including caps, mask, sterile gowns, sterile gloves, sterile drape, hand hygiene and skin antiseptic. A timeout was performed prior to the initiation of the procedure. The right groin was prepped and draped in the usual sterile fashion. Thereafter using modified Seldinger technique, transfemoral access into the right common femoral artery was obtained without difficulty. Over a 0.035 inch guidewire, a 5 French Pinnacle sheath was inserted. Through this, and also over 0.035 inch guidewire, a 5 [place] JB 1 catheter was advanced to the aortic arch region and selectively positioned in the right common carotid artery, the right subclavian artery, the left common carotid artery and the left vertebral artery. FINDINGS: The right subclavian arteriogram demonstrates the origin of the right vertebral artery to be mildly narrowed. The vessel is, otherwise, seen to opacify to the cranial skull base. Wide patency is seen of  the right vertebrobasilar junction and the right posterior-inferior cerebellar artery. Also demonstrated are muscular branches from the distal right vertebral artery retrogradely opacifying the occipital artery and subsequently the right external carotid artery distribution. Opacification is noted into the basilar artery with the opacified portions of the posterior cerebral arteries, superior cerebellar arteries to be widely patent. Unopacified blood is seen in the basilar artery from the contralateral vertebral artery. The right common carotid arteriogram demonstrates a stump at the site of the occlusion of the right external carotid artery. The right internal carotid artery at the bulb to the cranial skull base demonstrates wide patency. The petrous, cavernous and supraclinoid segments demonstrate wide patency. A right posterior communicating artery infundibulum is seen with a vessel emanating at its apex. The right middle cerebral artery and the right anterior cerebral artery opacify into the capillary and venous phases. A prominent right anterior choroidal artery is also noted. The left common carotid arteriogram demonstrates mild narrowing at the origin of the left external carotid artery. Its branches are, otherwise, normally opacified. 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 There is moderate reconstitution of the left internal carotid artery in the cavernous segment via the ipsilateral enlarged ophthalmic artery from multiple branches emanating from the internal maxillary artery, and the nasolacrimal arteries. There is associated mild stenosis of the supraclinoid segment of the left internal carotid artery. Opacification into the left middle cerebral artery and the left anterior cerebral artery distributions is noted. The left vertebral artery origin is widely patent. There is a mild tortuosity just distal to this. More distally, the vessel opacifies to the  cranial skull base. Wide patency is seen of the left vertebrobasilar junction and the left posterior-inferior cerebellar artery. The basilar artery, the posterior cerebral arteries, the superior cerebellar arteries and the anterior-inferior cerebellar arteries demonstrate opacification into the capillary and venous phases. There are prominent leptomeningeal collaterals arising from the P3 segment of the left posterior cerebral artery, and also from the posterior temporal branch of the proximal posterior cerebral artery reconstitution partially the left middle  cerebral artery distribution cortically and subcortically in its posterior 1/2. IMPRESSION: Angiographically no evidence of occlusions, stenosis, dissections or of intraluminal filling defects involving the right middle cerebral artery and the right anterior cerebral artery distributions. 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 Partial reconstitution of the right external carotid artery branches retroareolar via muscular branches arising from the distal right vertebral artery via the right occipital artery. 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 Mild-to-moderate reconstitution of the left middle cerebral artery posterior half cortical and subcortically from the P3 branches and the P1 P2 branches of the left posterior cerebral artery. PLAN: Patient extubated without difficulty without changes in neurological symptoms. Patient then transferred to neuro ICU for further stroke workup. Electronically Signed   By: Luanne Bras M.D.   On: 12/28/2017 12:38   Ir Angio Vertebral Sel Vertebral Uni L Mod Sed  Result Date: 12/30/2017 CLINICAL  DATA:  Left-sided hemiparesis, right gaze deviation and left-sided numbness. EXAM: BILATERAL COMMON CAROTID AND INNOMINATE ANGIOGRAPHY; IR ANGIO VERTEBRAL SEL SUBCLAVIAN INNOMINATE UNI RIGHT MOD SED; IR ANGIO VERTEBRAL SEL VERTEBRAL UNI LEFT MOD SED COMPARISON:  CT scan of the brain of 12/28/2017. MEDICATIONS: Heparin none; Ancef 2 g IV antibiotic was administered within 1 hour of the procedure. 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 ANESTHESIA/SEDATION: General anesthesia CONTRAST:  Isovue 300 approximately 60 mL FLUOROSCOPY TIME:  Fluoroscopy Time: 10 minutes 6 seconds (4726 mGy). COMPLICATIONS: None immediate. TECHNIQUE: Informed written consent was obtained from the patient after a thorough discussion of the procedural risks, benefits and alternatives. All questions were addressed. Maximal Sterile Barrier Technique was utilized including caps, mask, sterile gowns, sterile gloves, sterile drape, hand hygiene and skin antiseptic. A timeout was performed prior to the initiation of the procedure. The right groin was prepped and draped in the usual sterile fashion. Thereafter using modified Seldinger technique, transfemoral access into the right common femoral artery was obtained without difficulty. Over a 0.035 inch guidewire, a 5 French Pinnacle sheath was inserted. Through this, and also over 0.035 inch guidewire, a 5 [place] JB 1 catheter was advanced to the aortic arch region and selectively positioned in the right common carotid artery, the right subclavian artery, the left common carotid artery and the left vertebral artery. FINDINGS: The right subclavian arteriogram demonstrates the origin of the right vertebral artery to be mildly narrowed. The vessel is, otherwise, seen to opacify to the cranial skull base. Wide patency is seen of the right vertebrobasilar junction and the right posterior-inferior cerebellar artery. Also  demonstrated are muscular branches from the distal right vertebral artery retrogradely opacifying the occipital artery and subsequently the right external carotid artery distribution. Opacification is noted into the basilar artery with the opacified portions of the posterior cerebral arteries, superior cerebellar arteries to be widely patent. Unopacified blood is seen in the basilar artery from the contralateral vertebral artery. The right common carotid arteriogram demonstrates a stump at the site of the occlusion of the right external carotid artery. The right internal carotid artery at the bulb to the cranial skull base demonstrates wide patency. The petrous, cavernous and supraclinoid segments demonstrate wide patency. A right posterior communicating artery infundibulum is seen with  a vessel emanating at its apex. The right middle cerebral artery and the right anterior cerebral artery opacify into the capillary and venous phases. A prominent right anterior choroidal artery is also noted. The left common carotid arteriogram demonstrates mild narrowing at the origin of the left external carotid artery. Its branches are, otherwise, normally opacified. 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 There is moderate reconstitution of the left internal carotid artery in the cavernous segment via the ipsilateral enlarged ophthalmic artery from multiple branches emanating from the internal maxillary artery, and the nasolacrimal arteries. There is associated mild stenosis of the supraclinoid segment of the left internal carotid artery. Opacification into the left middle cerebral artery and the left anterior cerebral artery distributions is noted. The left vertebral artery origin is widely patent. There is a mild tortuosity just distal to this. More distally, the vessel opacifies to the cranial skull base. Wide patency is seen of the left vertebrobasilar junction and the left  posterior-inferior cerebellar artery. The basilar artery, the posterior cerebral arteries, the superior cerebellar arteries and the anterior-inferior cerebellar arteries demonstrate opacification into the capillary and venous phases. There are prominent leptomeningeal collaterals arising from the P3 segment of the left posterior cerebral artery, and also from the posterior temporal branch of the proximal posterior cerebral artery reconstitution partially the left middle cerebral artery distribution cortically and subcortically in its posterior 1/2. IMPRESSION: Angiographically no evidence of occlusions, stenosis, dissections or of intraluminal filling defects involving the right middle cerebral artery and the right anterior cerebral artery distributions. 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 Partial reconstitution of the right external carotid artery branches retroareolar via muscular branches arising from the distal right vertebral artery via the right occipital artery. 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 Mild-to-moderate reconstitution of the left middle cerebral artery posterior half cortical and subcortically from the P3 branches and the P1 P2 branches of the left posterior cerebral artery. PLAN: Patient extubated without difficulty without changes in neurological symptoms. Patient then transferred to neuro ICU for further stroke workup. Electronically Signed   By: Luanne Bras M.D.   On: 12/28/2017 12:38    DSA RT CFA appeoach.Findings. 1.No occlusions,stenosis,or filling defects or dissections involving RT anterior circulation. 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 3.Occluded Lt ICA  prox, with modest reconstitution of Lt ICA cavernous seg and Lt MCA and ACA from Lt ECA branches via ophthalmic Artery.  TTE  - Left ventricle: The cavity size was normal. There was moderateconcentric hypertrophy. Systolic function was vigorous. Theestimated ejection fraction was in the range of 65% to 70%. Wallmotion was normal; there were no regional wall motionabnormalities. There was an increased relative contribution ofatrial contraction to ventricular filling. Doppler parameters areconsistent with abnormal left ventricular relaxation (grade 1diastolic dysfunction). Doppler parameters are consistent withhigh ventricular filling pressure. - Aortic valve: Mild focal calcification involving the rightcoronary cusp. - Mitral valve: Calcified annulus.  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 Leonie Man Blood pressure (!) 134/46, pulse 80, temperature 99.9 F (37.7 C), temperature source Oral, resp. rate 20, SpO2 99 %. General - frail elderly caucasian lady, in no apparent distress. Cardiovascular -  Regular rate and rhythm, not in afib.  Mental Status -  Level of arousal and orientation to time, place, and person were intact. Language including expression, naming, repetition, comprehension was assessed and found intact.    Cranial Nerves II - XII - II - Visual field intact OU III, IV, VI - Extraocular movements intact.  V - Facial sensation intact bilaterally. VII - left nasolabial fold mild flattening. VIII - Hearing & vestibular intact bilaterally. X - Palate elevates symmetrically. XI - Chin turning & shoulder shrug intact bilaterally. XII - Tongue protrusion intact.  Motor Strength - The patient's strength was 4/5 LUE with weak left grip, 4+/5 RUE, LLE 4/5 proximal and 5/5 distal, RLE 4+/5 proximal and 5/5 distal.  Bulk was normal and fasciculations were absent.   Motor Tone -  Muscle tone was assessed at the neck and appendages and was normal.  Reflexes - The patient's reflexes were symmetrical in all extremities and she had no pathological reflexes.  Sensory - Light touch, temperature/pinprick were assessed and were decreased on the left, about 50% of the right.     Coordination - The patient had normal movements in the right hands with no ataxia or dysmetria, not able to test left hand due to weakness.  Tremor was absent.  Gait and Station - deferred.  Discharge Diet  Heart healthy thin liquids  DISCHARGE PLAN  Disposition:  Transfer to [place] for ongoing PT, OT and ST  aspirin 81 mg daily and Eliquis (apixaban) daily for secondary stroke prevention.  Recommend ongoing risk factor control by Primary Care Physician at time of discharge from inpatient rehabilitation.  Follow-up Coy Saunas, MD in 2 weeks following discharge from rehab.  Follow-up in [facility] in 4 weeks following discharge from rehab, office to schedule an appointment.   33 minutes were spent preparing discharge.  Burnetta Sabin, MSN, APRN, ANVP-BC, AGPCNP-BC Advanced Practice Stroke Nurse [place] for Schedule & Pager information 01/03/2018 1:32 PM  I have personally examined this patient, reviewed notes, independently viewed imaging studies, participated in medical decision making and plan of care.ROS completed by me personally and pertinent positives fully documented  I have made any additions or clarifications directly to the above note. Agree with note above.    Antony Contras, MD Medical Director [facility] Pager: [phone] 01/03/2018 1:53 PM

## 2018-01-03 NOTE — Progress Notes (Signed)
Catherine Greene, Catherine Salk, MD    Letta Pate Catherine Salk, MD  Physician  Physical Medicine and Rehabilitation      Consult Note  Signed     Date of Service:  01/01/2018 12:05 PM         Related encounter: ED to Hosp-Admission (Discharged) from 12/28/2017 in [facility]             Signed          Expand All Collapse All            Expand widget buttonCollapse widget button    Show:Clear all   ManualTemplateCopied  Added by:     Charlett Blake, MD   Hover for detailscustomization button                                                                                                                                                           Physical Medicine and Rehabilitation Consult  Reason for Consult:Stroke rehab  Referring Phsyician: Catherine Greene is an 77 y.o. female.    HPI: Patient with prior history of coronary artery disease, COPD, diabetes type 2, hypertension and hyperlipidemia as well as atrial fibrillation (although last EKG  Did not demonstrate this) and carotid stenosis admitted on 12/28/2017 with complaints of left-sided weakness.  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  Watershed versus proximal emboli suspected.  He had occluded left ICA decreased flow to left ACA and MCA distribution     Not a TPA candidate because of Eliquis.  Taken for endovascular thrombectomy  Echo demonstrated normal ejection fraction, moderate left ventricular concentric hypertrophy, grade 1 diastolic dysfunction mild calcification of the aortic and  mitral valve but no significant stenosis     Seen by physical therapy on 12/31/2017 min guard with bed mobility transfers with min assist, no ambulation thus far recorded  Speech therapy evaluation mild cognitive impairment decreased respiratory support, baseline mild memory impairment noted per daughter although worsened since most recent CVA, some left neglect was noted as well.  Review of Systems -H ENT, notes poor vision to the left, cardiac denies chest pain shortness of breath, GI denies nausea vomiting diarrhea constipation, GU denies dysuria or frequency, musculoskeletal denies any pain with upper extremity or lower extremity motion no neck or back pain, skin no rash or itching, remainder of review is negative          Past Medical History:    Diagnosis   Date    .  CAD (coronary artery disease)        .   Diabetes mellitus            type 2    .   Hyperlipidemia        .   Hypertension        .   Past heart attack   1998             Past Surgical History:    Procedure   Laterality   Date    .   APPENDECTOMY            .   BACK SURGERY                x 2    .   CESAREAN SECTION                x 3    .   IR ANGIO INTRA EXTRACRAN SEL COM CAROTID INNOMINATE BILAT MOD SED       12/28/2017    .   IR ANGIO VERTEBRAL SEL SUBCLAVIAN INNOMINATE UNI R MOD SED       12/28/2017    .   IR ANGIO VERTEBRAL SEL VERTEBRAL UNI L MOD SED       12/28/2017    .   NECK SURGERY            .   RADIOLOGY WITH ANESTHESIA   N/A   12/28/2017        Procedure: IR WITH ANESTHESIA;  Surgeon: Luanne Bras, MD;  Location: [facility];  Service: Radiology;  Laterality: N/A;    .   Skin cancer removal face            .   TONSILLECTOMY                     Family History    Problem   Relation   Age of Onset    .   Bone cancer   Mother   69             died    .   Heart failure   Father   39            died       Social History:  reports that she quit smoking about 21 years ago. Her smoking use included cigarettes. She has a 80.00 pack-year smoking history. She has never used smokeless tobacco. She reports that she does not drink alcohol or use drugs.  Allergies:         Allergies    Allergen   Reactions    .   Codeine                REACTION: sickness    .   Contrast Media [Iodinated Diagnostic Agents]        .   Metrizamide   Other (See Comments)            Upset stomach    .   Morphine                REACTION: sickness              Medications Prior to Admission    Medication   Sig   Dispense   Refill    .   amLODipine (NORVASC) 10 MG tablet   Take 10 mg by mouth daily.            Marland Kitchen  busPIRone (BUSPAR) 15 MG tablet   Take 15 mg by mouth 2 (two) times daily.            .   diphenoxylate-atropine (LOMOTIL) 2.5-0.025 MG tablet   Take 1 tablet by mouth 4 (four) times daily as needed for diarrhea or loose stools.             Marland Kitchen   ELIQUIS 5 MG TABS tablet   Take 5 mg by mouth 2 (two) times daily.             Marland Kitchen   gabapentin (NEURONTIN) 400 MG capsule   Take 400 mg by mouth every evening.             Marland Kitchen   glipiZIDE (GLUCOTROL) 5 MG tablet   Take 5 mg by mouth 2 (two) times daily before a meal.            .   losartan (COZAAR) 100 MG tablet   Take 100 mg by mouth daily.            .   magnesium oxide (MAG-OX) 400 MG tablet   Take 400 mg by mouth daily.             .   metFORMIN (GLUCOPHAGE) 1000 MG tablet   Take 1,000 mg by mouth 2 (two) times daily with a meal.             .   metoprolol tartrate (LOPRESSOR) 50 MG tablet   Take 200 mg by mouth 2 (two) times daily.            .   Omega-3 Fatty Acids (FISH OIL) 1000 MG CAPS   Take 2,000 mg by mouth daily.              .   pravastatin (PRAVACHOL) 40 MG tablet   Take 40 mg by mouth daily.            .   ranitidine (ZANTAC) 150 MG tablet   Take 150 mg by mouth 2 (two) times daily.             .   sertraline (ZOLOFT) 100 MG tablet   Take 100 mg by mouth daily.                  Home:  Home Living  Family/patient expects to be discharged to:: Private residence  Living Arrangements: Spouse/significant other, Children  Available Help at Discharge: Family, Available 24 hours/day  Type of Home: Mobile home  Home Access: Stairs to enter  [employer] of Steps: several  Entrance Stairs-Rails: Right, Left  Home Layout: One level  Bathroom Shower/Tub: Tourist information centre manager: Standard  Home Equipment: Radio producer - single point, Bedside commode   Lives With: Daughter, Significant other   Functional History:  Prior Function  Vocation: Retired  Comments: pt did have a gait disturbance, but independent in the home with/without AD and cane use outdoors  Functional Status:   Mobility:      Ambulation/Gait  General Gait Details: step to pivotal steps in the RW to the chair       ADL:  ADL  Toilet Transfer Details (indicate cue type and reason): simulated transfer to chair      Cognition:  Cognition  Overall Cognitive Status: Impaired/Different from baseline  Arousal/Alertness: Awake/alert  Orientation Level: Oriented X4  Attention: Sustained, Selective  Sustained Attention: Appears intact  Selective Attention: Appears intact  Memory: Impaired  Memory Impairment: Decreased short term memory, Decreased recall of new information, Other (comment)(working memory)  Decreased Short Term Memory: Verbal basic, Functional basic  Awareness: Impaired  Awareness Impairment: Emergent impairment, Anticipatory impairment  Executive Function: Reasoning, Sequencing  Reasoning: Impaired  Reasoning Impairment:  Verbal basic  Sequencing: Impaired  Sequencing Impairment: Verbal complex  Safety/Judgment: Impaired(decreased awareness of L visual field)  Cognition  Arousal/Alertness: Awake/alert  Behavior During Therapy: WFL for tasks assessed/performed  Overall Cognitive Status: Impaired/Different from baseline     Blood pressure (!) 175/69, pulse 87, temperature (!) 97.5 F (36.4 C), temperature source Oral, resp. rate 12, SpO2 96 %.  Physical Exam     Lab Results Last 24 Hours  Imaging Results (Last 48 hours)          General: No acute distress  Mood and affect are appropriate  Heart: Regular rate and rhythm no rubs murmurs or extra sounds  Lungs: Clear to auscultation, breathing unlabored, no rales or wheezes  Abdomen: Positive bowel sounds, soft nontender to palpation, nondistended  Extremities: No clubbing, cyanosis, or edema  Skin: No evidence of breakdown, no evidence of rash  Neurologic: Cranial nerves II through XII  intact, motor strength is 5/5 in RIght  deltoid, bicep, tricep, grip, hip flexor, knee extensors, ankle dorsiflexor and plantar flexor, 4/5 in the left deltoid, bicep, tricep, grip, hip flexion, knee extensor, ankle dorsi flexion plantar flexor  Left homonymous hemianopsia  Severe left visual neglect  Sensory exam normal sensation to light touch and proprioception in bilateral upper and lower extremities  Cerebellar exam normal finger to nose to finger as well as heel to shin in bilateral upper and lower extremities  Musculoskeletal: Full range of motion in all 4 extremities. 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 Yes   2.Co-Morbidities requiring supervision/potential complications: COPD, hypertension, diabetes type 2   3.Due to bladder management, bowel management, safety, skin/wound care, disease management, medication administration, pain management and patient education, does the patient require 24 hr/day rehab nursing? Yes   4.Does the patient require coordinated care of a physician, rehab nurse, PT (1-2 hrs/day, 5 days/week), OT (1-2 hrs/day, 5 days/week) and SLP (.5-1 hrs/day, 5 days/week) to address physical and functional deficits in the context of the above medical diagnosis(es)? Yes Addressing deficits in the following areas: balance, endurance, locomotion, strength, transferring, bowel/bladder control, bathing, dressing, feeding, grooming, toileting, cognition and psychosocial support   5.Can the patient actively participate in an intensive therapy program of at least 3 hrs of therapy per day at least 5 days per week? Yes   6.The potential for patient to make measurable gains while on inpatient  rehab is excellent   7.Anticipated functional outcomes upon discharge from inpatients are Sup PT, Sup OT, SupSLP   8.Estimated rehab length of stay to reach the above functional goals is: 7-10d   9.Does the patient have adequate social supports to accommodate these discharge functional goals? Yes   10.Anticipated D/C setting: Home   11.Anticipated post D/C treatments: [facility] therapy   12.Overall Rehab/Functional Prognosis: excellent      RECOMMENDATIONS:  This patient's condition is appropriate for continued rehabilitative care in the following setting: CIR  Patient has agreed to participate in recommended program. Yes  Note that insurance prior authorization may be required for reimbursement for recommended care.     Comment:        Charlett Blake  01/01/2018                      Routing History

## 2018-01-03 NOTE — Progress Notes (Signed)
Patient ID: Catherine Greene, female   DOB: 02/04/1941, 77 y.o.   MRN: [id number]  Admit to unit via bed, oriented to plan of care, rehab schedule and daily routine. States an understadning of information provided. [name]

## 2018-01-03 NOTE — H&P (Signed)
Physical Medicine and Rehabilitation Admission H&P    Chief Complaint  Patient presents with  . 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  Per chart review patient lives with spouse.  Mobile home with 5 steps to entry.  Presented 12/28/2017 with complaints of left-sided weakness.  Cranial CT scan showed right occipital lobe infarction new since prior exam.  No acute anterior circulation infarction.  MRI/MRA showed scattered foci of acute subacute cortical infarcts involving the right frontal, parietal, temporal and occipital lobes.  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  Echocardiogram with ejection fraction of 70% no wall motion abnormalities grade 1 diastolic dysfunction.  Underwent partial revascularization of left ICA terminus per interventional radiology.  TCD bubble study completed clinically insignificant findings of trivial PFO.  Neurology follow-up currently maintained on aspirin as well as Eliquis for CVA prophylaxis.  Tolerating a regular diet.  Therapy evaluations completed with recommendations of physical medicine rehab consult.  Patient was admitted for a comprehensive rehab program.  Review of Systems  Constitutional: Negative for chills and fever.  HENT: Negative for hearing loss.   Eyes: Positive for blurred vision. Negative for double vision.  Respiratory: Negative for cough and shortness of breath.   Cardiovascular: Positive for palpitations. Negative for leg swelling.  Gastrointestinal: Positive for constipation. Negative for nausea.  Genitourinary: Negative for dysuria, flank pain and hematuria.  Skin: Negative for rash.  Neurological: Positive for focal  weakness.  Psychiatric/Behavioral: Positive for depression.       Panic attacks  All other systems reviewed and are negative.  Past Medical History:  Diagnosis Date  . CAD (coronary artery disease)   . Diabetes mellitus    type 2  . Hyperlipidemia   . Hypertension   . Past heart attack 1998   Past Surgical History:  Procedure Laterality Date  . APPENDECTOMY    . BACK SURGERY     x 2  . CESAREAN SECTION     x 3  . IR ANGIO INTRA EXTRACRAN SEL COM CAROTID INNOMINATE BILAT MOD SED  12/28/2017  . IR ANGIO VERTEBRAL SEL SUBCLAVIAN INNOMINATE UNI R MOD SED  12/28/2017  . IR ANGIO VERTEBRAL SEL VERTEBRAL UNI L MOD SED  12/28/2017  . NECK SURGERY    . RADIOLOGY WITH ANESTHESIA N/A 12/28/2017   Procedure: IR WITH ANESTHESIA;  Surgeon: Luanne Bras, MD;  Location: [facility];  Service: Radiology;  Laterality: N/A;  . Skin cancer removal face    . TONSILLECTOMY     Family History  Problem Relation Age of Onset  . Bone cancer Mother 40       died  . Heart failure Father 32       died   Social History:  reports that she quit smoking about 21 years ago. Her smoking use included cigarettes. She has a 80.00 pack-year smoking history. She has never used smokeless tobacco. She reports that she does not drink alcohol or use drugs. Allergies:  Allergies  Allergen Reactions  . Codeine     REACTION: sickness  . Contrast Media [Iodinated Diagnostic Agents]   . Metrizamide Other (See Comments)    Upset stomach  . Morphine     REACTION: sickness  Medications Prior to Admission  Medication Sig Dispense Refill  . amLODipine (NORVASC) 10 MG tablet Take 10 mg by mouth daily.    . busPIRone (BUSPAR) 15 MG tablet Take 15 mg by mouth 2 (two) times daily.    . diphenoxylate-atropine (LOMOTIL) 2.5-0.025 MG tablet Take 1 tablet by mouth 4 (four) times daily as needed for diarrhea or loose stools.     Marland Kitchen ELIQUIS 5 MG TABS tablet Take 5 mg by mouth 2 (two) times daily.     Marland Kitchen gabapentin (NEURONTIN) 400  MG capsule Take 400 mg by mouth every evening.     Marland Kitchen glipiZIDE (GLUCOTROL) 5 MG tablet Take 5 mg by mouth 2 (two) times daily before a meal.    . losartan (COZAAR) 100 MG tablet Take 100 mg by mouth daily.    . magnesium oxide (MAG-OX) 400 MG tablet Take 400 mg by mouth daily.     . metFORMIN (GLUCOPHAGE) 1000 MG tablet Take 1,000 mg by mouth 2 (two) times daily with a meal.     . metoprolol tartrate (LOPRESSOR) 50 MG tablet Take 200 mg by mouth 2 (two) times daily.    . Omega-3 Fatty Acids (FISH OIL) 1000 MG CAPS Take 2,000 mg by mouth daily.     . pravastatin (PRAVACHOL) 40 MG tablet Take 40 mg by mouth daily.    . ranitidine (ZANTAC) 150 MG tablet Take 150 mg by mouth 2 (two) times daily.     . sertraline (ZOLOFT) 100 MG tablet Take 100 mg by mouth daily.      Drug Regimen Review Drug regimen was reviewed and remains appropriate with no significant issues identified  Home: Home Living Family/patient expects to be discharged to:: Private residence Living Arrangements: Spouse/significant other, Children Available Help at Discharge: Family, Available 24 hours/day Type of Home: Mobile home Home Access: Stairs to enter [employer] of Steps: several Entrance Stairs-Rails: Right, Left Home Layout: One level Bathroom Shower/Tub: Multimedia programmer: Standard Home Equipment: Radio producer - single point, Bedside commode  Lives With: Daughter, Significant other   Functional History: Prior Function Level of Independence: Independent with assistive device(s) Comments: pt did have a gait disturbance, but independent in the home with/without AD and cane use outdoors  Functional Status:  Mobility: Bed Mobility Overal bed mobility: Needs Assistance Bed Mobility: Rolling, Sit to Sidelying Rolling: Min guard Supine to sit: Min guard Sit to sidelying: Min assist General bed mobility comments: assist to elevate LEs back to bed Transfers Overall transfer level: Needs  assistance Equipment used: Rolling walker (2 wheeled) Transfers: Sit to/from Stand Sit to Stand: Min assist Stand pivot transfers: Min assist General transfer comment: Vcs for hand placement and postioing, min assist to power up to standing in increased time to power from varying surfaces Ambulation/Gait Ambulation/Gait assistance: Mod assist, +2 physical assistance Gait Distance (Feet): 20 Feet Assistive device: 2 person hand held assist Gait Pattern/deviations: Step-through pattern, Decreased stride length, Decreased weight shift to left, Trunk flexed, Drifts right/left General Gait Details: patient with noted posterior instability and left sided fatigue during mobility.     ADL: ADL Overall ADL's : Needs assistance/impaired Eating/Feeding: Minimal assistance, Sitting Grooming: Moderate assistance, Wash/dry hands, Standing Grooming Details (indicate cue type and reason): Pt performing hand hygiene at sink with Mod A for standing balance. Requiring Min A to faciltiate normalized movement of LUE when reaching (i.e. for soap or turn on faucet.) Upper Body Bathing: Moderate assistance Lower Body Bathing: Maximal assistance Lower Body Dressing: Moderate  assistance, Sit to/from stand Lower Body Dressing Details (indicate cue type and reason): Pt bending forward to pull up socks. using RUE only and not initating LUE towards socks. Mod A for standing balance Toilet Transfer: Minimal assistance, +2 for physical assistance, Ambulation, BSC Toilet Transfer Details (indicate cue type and reason): Min A +2 for power up into standing and then to guide to [facility]. Pt presenting with weakness and decreased balance Toileting- Clothing Manipulation and Hygiene: Moderate assistance, Sit to/from stand Toileting - Clothing Manipulation Details (indicate cue type and reason): Mod A for maintaining balance and then pt performing peri care Functional mobility during ADLs: Moderate assistance, +2 for physical  assistance General ADL Comments: Pt highly motivated. Continues to present with decreased balance, strength, vision, and functional use of LUE.  Cognition: Cognition Overall Cognitive Status: Impaired/Different from baseline Arousal/Alertness: Awake/alert Orientation Level: Oriented X4 Attention: Sustained, Selective Sustained Attention: Appears intact Selective Attention: Appears intact Memory: Impaired Memory Impairment: Decreased short term memory, Decreased recall of new information, Other (comment)(working memory) Decreased Short Term Memory: Verbal basic, Functional basic Awareness: Impaired Awareness Impairment: Emergent impairment, Anticipatory impairment Executive Function: Reasoning, Sequencing Reasoning: Impaired Reasoning Impairment: Verbal basic Sequencing: Impaired Sequencing Impairment: Verbal complex Safety/Judgment: Impaired(decreased awareness of L visual field) Cognition Arousal/Alertness: Awake/alert Behavior During Therapy: WFL for tasks assessed/performed Overall Cognitive Status: Impaired/Different from baseline Area of Impairment: Problem solving, Following commands Following Commands: Follows one step commands inconsistently, Follows multi-step commands inconsistently Problem Solving: Requires verbal cues, Requires tactile cues General Comments: Pt requiring increased time and cues during session. Very motivated to participate in therapy  Physical Exam: Blood pressure (!) 152/52, pulse 84, temperature 98 F (36.7 C), temperature source Oral, resp. rate 18, SpO2 99 %. Physical Exam  Constitutional: No distress.  HENT:  Right Ear: External ear normal.  Left Ear: External ear normal.  Eyes: Pupils are equal, round, and reactive to light.  Neck: Normal range of motion.  Cardiovascular: Normal rate. Exam reveals no friction rub.  No murmur heard. Respiratory: Effort normal. No respiratory distress. She has no wheezes. She has no rales.  GI: Soft. She  exhibits no distension. There is no tenderness.  Neurological:  Alert.  Flat affect. Fair insight and awareness. Normal language.   Has a right gaze preference with some left-sided inattention. Likely left HH as well. LUE 2-3/5 prox to distal. LLE 1-2/5 prox to distal. Decreased LT left hand and foot.   Skin: She is not diaphoretic.    Results for orders placed or performed during the hospital encounter of 12/28/17 (from the past 48 hour(s))  Glucose, capillary     Status: Abnormal   Collection Time: 01/01/18 11:42 AM  Result Value Ref Range   Glucose-Capillary 102 (H) 70 - 99 mg/dL   Comment 1 Notify RN    Comment 2 Document in Chart   Glucose, capillary     Status: Abnormal   Collection Time: 01/01/18  4:55 PM  Result Value Ref Range   Glucose-Capillary 136 (H) 70 - 99 mg/dL   Comment 1 Notify RN    Comment 2 Document in Chart   Glucose, capillary     Status: Abnormal   Collection Time: 01/01/18  9:10 PM  Result Value Ref Range   Glucose-Capillary 179 (H) 70 - 99 mg/dL  Glucose, capillary     Status: Abnormal   Collection Time: 01/02/18  6:24 AM  Result Value Ref Range   Glucose-Capillary 140 (H) 70 - 99 mg/dL  Glucose, capillary  Status: Abnormal   Collection Time: 01/02/18 11:19 AM  Result Value Ref Range   Glucose-Capillary 152 (H) 70 - 99 mg/dL  Glucose, capillary     Status: None   Collection Time: 01/02/18  3:48 PM  Result Value Ref Range   Glucose-Capillary 82 70 - 99 mg/dL  Glucose, capillary     Status: Abnormal   Collection Time: 01/02/18  9:17 PM  Result Value Ref Range   Glucose-Capillary 140 (H) 70 - 99 mg/dL   Comment 1 Notify RN    Comment 2 Document in Chart    No results found.     Medical Problem List and Plan: 1.  Left-sided weakness secondary to right MCA scattered infarct embolic.  Status post partial revascularization of left ICA terminus as well as history of CVA  -admit to inpatient rehab 2.  DVT Prophylaxis/Anticoagulation: Eliquis 3.  Pain Management: Neurontin 400 mg daily, Fioricet for headaches as needed 4. Mood: BuSpar 15 mg twice daily, Zoloft 100 mg daily 5. Neuropsych: This patient is capable of making decisions on her own behalf. 6. Skin/Wound Care: Routine skin checks 7. Fluids/Electrolytes/Nutrition: Routine in and outs with follow-up chemistries. Encourage PO 8.  Hypertension.  Lopressor 100 mg twice daily, Cozaar 100 mg daily, Norvasc 10 mg daily 9.  Diabetes mellitus with peripheral neuropathy.  Hemoglobin A1c 5.7.  Glucophage 1000 mg twice daily.  Check blood sugars before meals and at bedtime 10.  COPD with remote tobacco abuse.  No shortness of breath reported 11.  Hyperlipidemia.  Pravachol   Post Admission Physician Evaluation: 1. Functional deficits secondary  to Right MCA infarct. 2. Patient is admitted to receive collaborative, interdisciplinary care between the physiatrist, rehab nursing staff, and therapy team. 3. Patient's level of medical complexity and substantial therapy needs in context of that medical necessity cannot be provided at a lesser intensity of care such as a SNF. 4. Patient has experienced substantial functional loss from his/her baseline which was documented above under the "Functional History" and "Functional Status" headings.  Judging by the patient's diagnosis, physical exam, and functional history, the patient has potential for functional progress which will result in measurable gains while on inpatient rehab.  These gains will be of substantial and practical use upon discharge  in facilitating mobility and self-care at the household level. 5. Physiatrist will provide 24 hour management of medical needs as well as oversight of the therapy plan/treatment and provide guidance as appropriate regarding the interaction of the two. 6. The Preadmission Screening has been reviewed and patient status is unchanged unless otherwise stated above. 7. 24 hour rehab nursing will assist with bladder  management, bowel management, safety, skin/wound care, disease management, medication administration and patient education  and help integrate therapy concepts, techniques,education, etc. 8. PT will assess and treat for/with: Lower extremity strength, range of motion, stamina, balance, functional mobility, safety, adaptive techniques and equipment , NMR, visual-spatial awareness, family education.   Goals are: supervision. 9. OT will assess and treat for/with: ADL's, functional mobility, safety, upper extremity strength, adaptive techniques and equipment, NMR, visual-spatial awareness, family ed.   Goals are: supervision. Therapy may proceed with showering this patient. 10. SLP will assess and treat for/with: cognition, commuication.  Goals are: supervision. 11. Case Management and Social Worker will assess and treat for psychological issues and discharge planning. 12. Team conference will be held weekly to assess progress toward goals and to determine barriers to discharge. 13. Patient will receive at least 3 hours of therapy per  day at least 5 days per week. 14. ELOS: 7-10 days       15. Prognosis:  excellent   I have personally performed a face to face diagnostic evaluation of this patient and formulated the key components of the plan.  Additionally, I have personally reviewed laboratory data, imaging studies, as well as relevant notes and concur with the physician assistant's documentation above.  Meredith Staggers, MD, FAAPMR    Lavon Paganini Nelson, PA-C 01/03/2018

## 2018-01-03 NOTE — Progress Notes (Signed)
Physical Therapy Treatment Patient Details Name: Catherine Greene MRN: [id number] DOB: 1940/09/03 Today's Date: 01/03/2018    History of Present Illness Pt is a 77 y/o female with pmh significant for CAD, COPD, DM, 2 strokes with residual weakness in both LE's, admitted with worsening left-sided weakness and right gaze preference.  MRI showed scattered infarcts involving the R fontal, parietal, temporal and occipital lobes.    PT Comments    Pt seen for activity progressions; tolerated treatment well. Pt performed ambulation activity with 2 person HHA. Pt is motivated and excited to progress to CIR this afternoon, PT plan of care recommendations remain appropriate.       Follow Up Recommendations  CIR     Equipment Recommendations  None recommended by PT    Recommendations for Other Services       Precautions / Restrictions Precautions Precautions: Fall Restrictions Weight Bearing Restrictions: No    Mobility  Bed Mobility Overal bed mobility: Needs Assistance Bed Mobility: Rolling;Supine to Sit Rolling: Min assist   Supine to sit: Min assist     General bed mobility comments: min assist to initiate supine to sitting, requires physical assist to transfer LE to EOB and elevate trunk to sitting.   Transfers Overall transfer level: Needs assistance Equipment used: 2 person hand held assist Transfers: Sit to/from Stand Sit to Stand: Min assist Stand pivot transfers: Min assist       General transfer comment: min assist to power up to standing with increased time to regain balance and increase base of support   Ambulation/Gait Ambulation/Gait assistance: Mod assist;+2 physical assistance Gait Distance (Feet): 45 Feet Assistive device: 2 person hand held assist Gait Pattern/deviations: Step-through pattern;Decreased stride length;Decreased weight shift to right   Gait velocity interpretation: <1.31 ft/sec, indicative of household ambulator General Gait Details: pt  with noted instability and L sided fatigue during mobility, mod assist as fatigue progresses. Heavy reliance on L sided UE physical assistance for safety and stability   Stairs             Wheelchair Mobility    Modified Rankin (Stroke Patients Only) Modified Rankin (Stroke Patients Only) Pre-Morbid Rankin Score: Slight disability Modified Rankin: Moderately severe disability     Balance Overall balance assessment: Needs assistance Sitting-balance support: Feet supported;Single extremity supported Sitting balance-Leahy Scale: Fair Sitting balance - Comments: shows need for external assistance (bedrail ) Postural control: Left lateral lean Standing balance support: Bilateral upper extremity supported Standing balance-Leahy Scale: Poor Standing balance comment: Pt relies on physical assist for stability due to L sided weakness and fatigue                            Cognition Arousal/Alertness: Awake/alert Behavior During Therapy: WFL for tasks assessed/performed Overall Cognitive Status: Impaired/Different from baseline Area of Impairment: Problem solving;Following commands;Safety/judgement                       Following Commands: Follows one step commands with increased time;Follows multi-step commands inconsistently Safety/Judgement: Decreased awareness of deficits;Decreased awareness of safety   Problem Solving: Requires verbal cues;Requires tactile cues General Comments: Pt requires increased time during session with verbal and tactile cues      Exercises      General Comments General comments (skin integrity, edema, etc.): Significant other in room during session       Pertinent Vitals/Pain Pain Assessment: No/denies pain(pt states 0 pain since recieving medication ) Pain  Location: back    Home Living   Living Arrangements: (boyfriend, Gwyndolyn Saxon "[employer]" with her 24/7, daughter Juliann Pulse v)                  Prior Function             PT Goals (current goals can now be found in the care plan section) Acute Rehab PT Goals Patient Stated Goal: return home  PT Goal Formulation: With patient/family Time For Goal Achievement: 01/13/18 Potential to Achieve Goals: Good Progress towards PT goals: Progressing toward goals    Frequency    Min 4X/week      PT Plan Current plan remains appropriate    Co-evaluation     PT goals addressed during session: Mobility/safety with mobility        AM-PAC PT "6 Clicks" Daily Activity  Outcome Measure  Difficulty turning over in bed (including adjusting bedclothes, sheets and blankets)?: A Little Difficulty moving from lying on back to sitting on the side of the bed? : Unable Difficulty sitting down on and standing up from a chair with arms (e.g., wheelchair, bedside commode, etc,.)?: Unable Help needed moving to and from a bed to chair (including a wheelchair)?: A Little Help needed walking in hospital room?: A Lot Help needed climbing 3-5 steps with a railing? : A Lot 6 Click Score: 12    End of Session Equipment Utilized During Treatment: Gait belt Activity Tolerance: Patient tolerated treatment well Patient left: in chair;with family/visitor present;with call bell/phone within reach Nurse Communication: Mobility status PT Visit Diagnosis: Unsteadiness on feet (R26.81);Other abnormalities of gait and mobility (R26.89);Hemiplegia and hemiparesis Hemiplegia - Right/Left: Left Hemiplegia - dominant/non-dominant: Non-dominant Hemiplegia - caused by: Cerebral infarction     Time: 4562-5638 PT Time Calculation (min) (ACUTE ONLY): 16 min  Charges:  $Gait Training: 8-22 mins                     Alanda Slim, SPT    Emilyn Ruble 01/03/2018, 4:20 PM

## 2018-01-04 ENCOUNTER — Inpatient Hospital Stay (HOSPITAL_COMMUNITY): Payer: Medicare Other

## 2018-01-04 ENCOUNTER — Inpatient Hospital Stay (HOSPITAL_COMMUNITY): Payer: Medicare Other | Admitting: Speech Pathology

## 2018-01-04 ENCOUNTER — Inpatient Hospital Stay (HOSPITAL_COMMUNITY): Payer: Medicare Other | Admitting: Physical Therapy

## 2018-01-04 DIAGNOSIS — M19032 Primary osteoarthritis, left wrist: Secondary | ICD-10-CM

## 2018-01-04 DIAGNOSIS — E119 Type 2 diabetes mellitus without complications: Secondary | ICD-10-CM

## 2018-01-04 LAB — CBC WITH DIFFERENTIAL/PLATELET
Abs Immature Granulocytes: 0.06 10*3/uL (ref 0.00–0.07)
BASOS ABS: 0 10*3/uL (ref 0.0–0.1)
Basophils Relative: 0 %
EOS ABS: 0.5 10*3/uL (ref 0.0–0.5)
Eosinophils Relative: 4 %
HEMATOCRIT: 35.3 % — AB (ref 36.0–46.0)
HEMOGLOBIN: 11.5 g/dL — AB (ref 12.0–15.0)
Immature Granulocytes: 1 %
LYMPHS ABS: 1.4 10*3/uL (ref 0.7–4.0)
LYMPHS PCT: 12 %
MCH: 28.9 pg (ref 26.0–34.0)
MCHC: 32.6 g/dL (ref 30.0–36.0)
MCV: 88.7 fL (ref 80.0–100.0)
MONO ABS: 1.4 10*3/uL — AB (ref 0.1–1.0)
Monocytes Relative: 13 %
NRBC: 0 % (ref 0.0–0.2)
Neutro Abs: 7.7 10*3/uL (ref 1.7–7.7)
Neutrophils Relative %: 70 %
Platelets: 182 10*3/uL (ref 150–400)
RBC: 3.98 MIL/uL (ref 3.87–5.11)
RDW: 13.3 % (ref 11.5–15.5)
WBC: 11 10*3/uL — AB (ref 4.0–10.5)

## 2018-01-04 LAB — COMPREHENSIVE METABOLIC PANEL
ALBUMIN: 3.3 g/dL — AB (ref 3.5–5.0)
ALK PHOS: 50 U/L (ref 38–126)
ALT: 15 U/L (ref 0–44)
ANION GAP: 9 (ref 5–15)
AST: 16 U/L (ref 15–41)
BILIRUBIN TOTAL: 0.7 mg/dL (ref 0.3–1.2)
BUN: 23 mg/dL (ref 8–23)
CALCIUM: 9.4 mg/dL (ref 8.9–10.3)
CO2: 28 mmol/L (ref 22–32)
Chloride: 99 mmol/L (ref 98–111)
Creatinine, Ser: 0.97 mg/dL (ref 0.44–1.00)
GFR calc Af Amer: >60 mL/min (ref >60)
GFR calc non Af Amer: 54 mL/min — ABNORMAL LOW (ref >60)
GLUCOSE: 149 mg/dL — AB (ref 70–99)
Potassium: 4 mmol/L (ref 3.5–5.1)
SODIUM: 136 mmol/L (ref 135–145)
TOTAL PROTEIN: 6.8 g/dL (ref 6.5–8.1)

## 2018-01-04 LAB — GLUCOSE, CAPILLARY
GLUCOSE-CAPILLARY: 127 mg/dL — AB (ref 70–99)
Glucose-Capillary: 139 mg/dL — ABNORMAL HIGH (ref 70–99)
Glucose-Capillary: 85 mg/dL (ref 70–99)
Glucose-Capillary: 139 mg/dL — ABNORMAL HIGH (ref 70–99)

## 2018-01-04 MED ORDER — ONDANSETRON HCL 4 MG/2ML IJ SOLN
4.0000 mg | Freq: Four times a day (QID) | INTRAMUSCULAR | Status: DC | PRN
  Administered 2018-01-04: 4 mg via INTRAVENOUS
  Filled 2018-01-04: qty 2

## 2018-01-04 NOTE — Progress Notes (Signed)
Patient information reviewed and entered into eRehab system by Stellarose Cerny, RN, CRRN, PPS Coordinator.  Information including medical coding, functional ability and quality indicators will be reviewed and updated through discharge.     Per nursing patient was given "Data Collection Information Summary for Patients in Inpatient Rehabilitation Facilities with attached "Privacy Act Statement-Health Care Records" upon admission.  

## 2018-01-04 NOTE — Progress Notes (Signed)
Physical Therapy Assessment and Plan  Patient Details  Name: Catherine Greene MRN: [id number] Date of Birth: [birth year]  PT Diagnosis: Abnormal posture, Abnormality of gait, Hemiplegia non-dominant and Impaired cognition Rehab Potential: Good ELOS: 10-14 days   Today's Date: 01/04/2018 PT Individual Time: 1520-1630 PT Individual Time Calculation (min): 70 min    Problem List:  Patient Active Problem List   Diagnosis Date Noted  . Chronic pulmonary embolism ([place]) 01/03/2018  . Carotid stenosis, right 01/03/2018  . 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 Acute ischemic stroke ([place]) 12/28/2017  . B12 deficiency 10/12/2016  . Procedure refused 10/12/2016  . Dysthymia 08/31/2016  . Controlled substance agreement signed 05/03/2016  . URI (upper respiratory infection) 05/03/2016  . Chronic right shoulder pain 02/02/2016  . Abscess of face 09/11/2015  . Nausea 09/11/2015  . Ischemic stroke ([place]) 08/30/2015  . Acute maxillary sinusitis 04/02/2015  . Chronic obstructive pulmonary disease ([place]) 03/04/2015  . Fatigue 02/20/2015  . Hypoxia 02/20/2015  . Left shoulder pain 10/21/2014  . Breast cancer screening, high risk patient 06/20/2014  . Polyneuropathy associated with underlying disease ([place]) 06/20/2014  . Back pain 11/15/2013  . Myalgia 11/15/2013  . Proteinuria 11/15/2013  . Anxiety 07/02/2013  . Colonoscopy refused 05/21/2013  . Diarrhea 05/21/2013  . Weight loss 05/21/2013  . 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 Carotid occlusion, left 07/04/2012  . Peripheral vascular disease ([place]) 07/04/2012  . Scoliosis (and kyphoscoliosis), idiopathic 07/04/2012  . Spondylolisthesis, congenital 07/04/2012  . Hyperlipidemia 07/04/2012  . Type II diabetes mellitus ([place]) 07/04/2012  . Diabetes mellitus, type II ([place]) 03/13/2010  . HYPERLIPIDEMIA-MIXED 03/13/2010  . Essential hypertension 03/13/2010  . CAD (coronary artery disease), native coronary  artery 03/13/2010    Past Medical History:  Past Medical History:  Diagnosis Date  . CAD (coronary artery disease)   . Diabetes mellitus    type 2  . Hyperlipidemia   . Hypertension   . Past heart attack 1998   Past Surgical History:  Past Surgical History:  Procedure Laterality Date  . APPENDECTOMY    . BACK SURGERY     x 2  . CESAREAN SECTION     x 3  . IR ANGIO INTRA EXTRACRAN SEL COM CAROTID INNOMINATE BILAT MOD SED  12/28/2017  . IR ANGIO VERTEBRAL SEL SUBCLAVIAN INNOMINATE UNI R MOD SED  12/28/2017  . IR ANGIO VERTEBRAL SEL VERTEBRAL UNI L MOD SED  12/28/2017  . NECK SURGERY    . RADIOLOGY WITH ANESTHESIA N/A 12/28/2017   Procedure: IR WITH ANESTHESIA;  Surgeon: Luanne Bras, MD;  Location: [facility];  Service: Radiology;  Laterality: N/A;  . Skin cancer removal face    . TONSILLECTOMY      Assessment & Plan Clinical Impression: Patient is a 77 year old right-handed female with history of CAD, COPD with remote tobacco abuse, diabetes mellitus, 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 Per chart review patient lives with spouse. Mobile home with 5 steps to entry. Presented 12/28/2017 with complaints of left-sided weakness. Cranial CT scan showed right occipital lobe infarction new since prior exam. No acute anterior circulation infarction. MRI/MRA showed scattered foci of acute subacute cortical infarcts involving the right frontal, parietal, temporal and occipital lobes. 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 Echocardiogram with ejection fraction of 70% no wall motion abnormalities grade 1  diastolic dysfunction. Underwent partial revascularization of left ICA terminus per interventional radiology. TCD bubble study completed clinically insignificant findings of trivial PFO. Neurology follow-up  currently maintained on aspirin as well as Eliquis for CVA prophylaxis.   Patient transferred to CIR on 01/03/2018 .   Patient currently requires mod with mobility secondary to muscle weakness and muscle joint tightness, decreased cardiorespiratoy endurance, unbalanced muscle activation, motor apraxia, decreased coordination and decreased motor planning, decreased visual perceptual skills and decreased visual motor skills, decreased attention to left and ideational apraxia, decreased initiation, decreased attention, decreased awareness, decreased problem solving, decreased safety awareness, decreased memory and delayed processing and decreased sitting balance, decreased standing balance, decreased postural control, hemiplegia and decreased balance strategies.  Prior to hospitalization, patient was modified independent  with mobility and lived with Significant other, Daughter in a Mobile home home.  Home access is 3Stairs to enter.  Patient will benefit from skilled PT intervention to maximize safe functional mobility, minimize fall risk and decrease caregiver burden for planned discharge home with 24 hour supervision.  Anticipate patient will benefit from follow up [place] at discharge.  PT - End of Session Activity Tolerance: Tolerates 10 - 20 min activity with multiple rests;Tolerates < 10 min activity, no significant change in vital signs Endurance Deficit: Yes Endurance Deficit Description: generalized weakness PT Assessment Rehab Potential (ACUTE/IP ONLY): Good PT Barriers to Discharge: Inaccessible home environment;Home environment access/layout PT Patient demonstrates impairments in the following area(s): Balance;Behavior;Edema;Endurance;Motor;Perception;Safety;Sensory PT Transfers Functional Problem(s): Bed Mobility;Bed to Chair;Car;Furniture;Floor PT Locomotion Functional Problem(s): Ambulation;Wheelchair Mobility;Stairs PT Plan PT Intensity: Minimum of 1-2 x/day ,45 to 90 minutes PT Frequency:  Total of 15 hours over 7 days of combined therapies PT Duration Estimated Length of Stay: 10-14 days PT Treatment/Interventions: Ambulation/gait training;Balance/vestibular training;Cognitive remediation/compensation;Community reintegration;Discharge planning;Disease management/prevention;Functional electrical stimulation;DME/adaptive equipment instruction;Functional mobility training;Neuromuscular re-education;Patient/family education;Pain management;Psychosocial support;Skin care/wound management;Stair training;Splinting/orthotics;UE/LE Strength taining/ROM;Therapeutic Exercise;Therapeutic Activities;Wheelchair propulsion/positioning;UE/LE Coordination activities;Visual/perceptual remediation/compensation PT Transfers Anticipated Outcome(s): Supervision assist with LRAD  PT Locomotion Anticipated Outcome(s): supevision assist with LRAD  PT Recommendation Recommendations for Other Services: Neuropsych consult;Therapeutic Recreation consult Follow Up Recommendations: Home health PT;Skilled nursing facility Equipment Recommended: To be determined  Skilled Therapeutic Intervention Pt received supine in bed and agreeable to PT. Supine>sit transfer with mod assist and moderate cues for sequencing. PT instructed patient in PT Evaluation and initiated treatment intervention; see below for results. PT educated patient in [place], rehab potential, rehab goals, and discharge recommendations.  Pt returned to room and performed stand pivot transfer to bed with min assit. Sit>supine completed with mod assist due to apraxia and left supine in bed with call bell in reach and all needs met.     PT Evaluation Precautions/Restrictions Precautions Precautions: Fall Precaution Comments: L hemi Restrictions Weight Bearing Restrictions: No General   Vital SignsTherapy Vitals Temp: 98.8 F (37.1 C) Temp Source: Oral Pulse Rate: 81 Resp: 18 BP: (!) 115/49 Patient Position (if appropriate): Lying Oxygen  Therapy SpO2: 91 % O2 Device: Room Air Pain Pain Assessment Pain Scale: 0-10 Pain Score: 5  Faces Pain Scale: Hurts a little bit Pain Type: Acute pain Pain Location: Hand Pain Orientation: Left Pain Descriptors / Indicators: Aching Pain Intervention(s): RN made aware Home Living/Prior Functioning Home Living Available Help at Discharge: Family;Available 24 hours/day Type of Home: Mobile home Home Access: Stairs to enter Entrance Stairs-Number of Steps: 3 Entrance Stairs-Rails: Right;Left Home Layout: One level Bathroom Shower/Tub: Multimedia programmer: Standard Bathroom Accessibility: Yes  Lives With: Significant other;Daughter Prior Function Level  of Independence: Requires assistive device for independence  Able to Take Stairs?: Yes Driving: No Vocation: Retired Comments: pt did have a gait disturbance, but independent in the home with/without AD and cane use outdoors Vision/Perception  Vision - Assessment Eye Alignment: Impaired (comment) Ocular Range of Motion: Impaired-to be further tested in functional context(L eye not tracking) Alignment/Gaze Preference: Head tilt;Gaze right Tracking/Visual Pursuits: Decreased smoothness of horizontal tracking;Decreased smoothness of vertical tracking;Left eye does not track laterally;Left eye does not track medially Saccades: Additional head turns occurred during testing;Decreased speed of saccadic movement Perception Perception: Impaired Inattention/Neglect: Does not attend to left visual field Praxis Praxis: Impaired Praxis Impairment Details: Motor planning  Cognition Overall Cognitive Status: Impaired/Different from baseline Arousal/Alertness: Awake/alert Attention: Selective Sustained Attention: Appears intact Selective Attention: Appears intact Memory: Impaired Memory Impairment: Decreased short term memory;Decreased recall of new information Decreased Short Term Memory: Verbal basic;Functional  basic Awareness: Impaired Awareness Impairment: Emergent impairment;Anticipatory impairment Problem Solving: Impaired Problem Solving Impairment: Functional basic Executive Function: Reasoning;Sequencing Reasoning: Impaired Reasoning Impairment: Verbal basic Sequencing: Impaired Sequencing Impairment: Verbal complex Safety/Judgment: Appears intact Sensation Sensation Light Touch: Appears Intact Coordination Gross Motor Movements are Fluid and Coordinated: No Fine Motor Movements are Fluid and Coordinated: No Coordination and Movement Description: generalized weakness Finger Nose Finger Test: Slow and delayed, overshooting Heel Shin Test: difficulty following instruction for task Motor  Motor Motor: Hemiplegia;Other (comment) Motor - Skilled Clinical Observations: generalized weakness and L hemi  Mobility Bed Mobility Bed Mobility: Rolling Right;Rolling Left;Supine to Sit;Sit to Supine Rolling Right: Minimal Assistance - Patient > 75% Rolling Left: Minimal Assistance - Patient > 75% Supine to Sit: Moderate Assistance - Patient 50-74% Sit to Supine: Moderate Assistance - Patient 50-74% Transfers Transfers: Sit to Stand;Stand to Sit;Stand Pivot Transfers Sit to Stand: Moderate Assistance - Patient 50-74% Stand to Sit: Minimal Assistance - Patient > 75% Stand Pivot Transfers: Minimal Assistance - Patient > 75%;Moderate Assistance - Patient 50 - 74% Stand Pivot Transfer Details: Tactile cues for sequencing;Verbal cues for technique;Verbal cues for sequencing;Verbal cues for safe use of DME/AE Transfer (Assistive device): Standard walker  Car transfer: mod assist. UE support on RW.  Locomotion  Gait Ambulation: Yes Gait Assistance: Minimal Assistance - Patient > 75% Gait Distance (Feet): 100 Feet Assistive device: Rolling walker Gait Assistance Details: Verbal cues for sequencing;Verbal cues for precautions/safety;Verbal cues for gait pattern;Verbal cues for safe use of  DME/AE;Tactile cues for placement Gait Gait: Yes Gait Pattern: Impaired Gait Pattern: Decreased stride length;Narrow base of support Stairs / Additional Locomotion Stairs: Yes Stairs Assistance: Moderate Assistance - Patient 50 - 74% Stair Management Technique: Two rails Number of Stairs: 4 Height of Stairs: 6 Wheelchair Mobility Wheelchair Mobility: No  Trunk/Postural Assessment  Cervical Assessment Cervical Assessment: Exceptions to WFL(forward head) Thoracic Assessment Thoracic Assessment: Exceptions to WFL(kyphotic posture) Lumbar Assessment Lumbar Assessment: Exceptions to WFL(posterior pelvic tilt) Postural Control Postural Control: Deficits on evaluation  Balance Balance Balance Assessed: Yes Static Sitting Balance Static Sitting - Balance Support: Feet supported;No upper extremity supported Static Sitting - Level of Assistance: 5: Stand by assistance Dynamic Sitting Balance Dynamic Sitting - Balance Support: Feet supported;No upper extremity supported Dynamic Sitting - Level of Assistance: 5: Stand by assistance Dynamic Sitting - Balance Activities: Reaching across midline Static Standing Balance Static Standing - Balance Support: Bilateral upper extremity supported;During functional activity Static Standing - Level of Assistance: 4: Min assist Dynamic Standing Balance Dynamic Standing - Balance Support: During functional activity;Bilateral upper extremity supported Dynamic Standing - Level of  Assistance: 4: Min assist Dynamic Standing - Balance Activities: Reaching across midline Extremity Assessment  RUE Assessment RUE Assessment: Exceptions to [facility] General Strength Comments: 3/5 overall. Grip strength: 20 lbs LUE Assessment LUE Assessment: Exceptions to [facility] Active Range of Motion (AROM) Comments: Shoulder flex: 50 degrees, elbow WFL General Strength Comments: Grip Strength: <1 lb LUE Body System: Neuro Brunstrum levels for arm and hand: Arm;Hand Brunstrum  level for arm: Stage III Synergy is performed voluntarily Brunstrum level for hand: Stage IV Movements deviating from synergies RLE Assessment RLE Assessment: Within Functional Limits LLE Assessment LLE Assessment: Exceptions to [facility] General Strength Comments: grossly 4+.5 with delayed activation proximal to distal    Refer to Care Plan for Long Term Goals  Recommendations for other services: Neuropsych and Therapeutic Recreation  Stress management and Outing/community reintegration  Discharge Criteria: Patient will be discharged from PT if patient refuses treatment 3 consecutive times without medical reason, if treatment goals not met, if there is a change in medical status, if patient makes no progress towards goals or if patient is discharged from hospital.  The above assessment, treatment plan, treatment alternatives and goals were discussed and mutually agreed upon: by patient  Lorie Phenix 01/04/2018, 4:22 PM

## 2018-01-04 NOTE — Progress Notes (Signed)
[facility] PHYSICAL MEDICINE & REHABILITATION PROGRESS NOTE   Subjective/Complaints: Complaining of throbbing pain in left wrist. States she's had arthritis/pain in hand before. Denies any tingling or burning. More throbbing. Doesn't recall any injury  ROS: Patient denies fever, rash, sore throat, blurred vision, nausea, vomiting, diarrhea, cough, shortness of breath or chest pain,  headache, or mood change.    Objective:   No results found. Recent Labs    01/03/18 1225 01/04/18 0547  WBC 13.3* 11.0*  HGB 11.3* 11.5*  HCT 35.6* 35.3*  PLT 183 182   Recent Labs    01/03/18 1225 01/04/18 0547  NA 137 136  K 3.8 4.0  CL 100 99  CO2 28 28  GLUCOSE 151* 149*  BUN 21 23  CREATININE 0.97 0.97  CALCIUM 9.2 9.4    Intake/Output Summary (Last 24 hours) at 01/04/2018 0857 Last data filed at 01/03/2018 2118 Gross per 24 hour  Intake 420 ml  Output -  Net 420 ml     Physical Exam: Vital Signs Blood pressure (!) 152/69, pulse 86, temperature 99 F (37.2 C), temperature source Oral, resp. rate 19, height 5\' 2"  (1.575 m), weight 68 kg, SpO2 92 %. Constitutional: No distress . Vital signs reviewed. HEENT: EOMI, oral membranes moist Neck: supple Cardiovascular: RRR without murmur. No JVD    Respiratory: CTA Bilaterally without wheezes or rales. Normal effort    GI: BS +, non-tender, non-distended  Musc: left wrist tender with some swelling at 1/2 [facility] area. Neurological: Alert. Flat affect. Fair insight and awareness. Normal language. Has a right gaze preference with some left-sided inattention--stable. Likely left HH as well. LUE 2-3/5 prox to distal. LLE 1-2/5 prox to distal--no changes today. Decreased sensation distal LUE and LLE Skin: intact.    Assessment/Plan: 1. Functional deficits secondary to embolic right MCA infarct which require 3+ hours per day of interdisciplinary therapy in a comprehensive inpatient rehab setting.  Physiatrist is providing close team  supervision and 24 hour management of active medical problems listed below.  Physiatrist and rehab team continue to assess barriers to discharge/monitor patient progress toward functional and medical goals  Care Tool:  Bathing  Bathing activity did not occur: (activity did not occur; late admit)           Bathing assist       Upper Body Dressing/Undressing Upper body dressing Upper body dressing/undressing activity did not occur (including orthotics): (activity did not occur; late admit) What is the patient wearing?: Hospital gown only    Upper body assist Assist Level: Moderate Assistance - Patient 50 - 74%    Lower Body Dressing/Undressing Lower body dressing    Lower body dressing activity did not occur: (activity did not occur; late admit) What is the patient wearing?: Incontinence brief     Lower body assist Assist for lower body dressing: Maximal Assistance - Patient 25 - 49%     Toileting Toileting    Toileting assist Assist for toileting: Moderate Assistance - Patient 50 - 74%     Transfers Chair/bed transfer  Transfers assist     Chair/bed transfer assist level: Moderate Assistance - Patient 50 - 74%     Locomotion Ambulation   Ambulation assist              Walk 10 feet activity   Assist           Walk 50 feet activity   Assist           Walk  150 feet activity   Assist           Walk 10 feet on uneven surface  activity   Assist           Wheelchair     Assist               Wheelchair 50 feet with 2 turns activity    Assist            Wheelchair 150 feet activity     Assist            Medical Problem List and Plan: 1.Left-sided weaknesssecondary to right MCA scattered infarct embolic. Status post partial revascularization of left ICA terminus as well as history of CVA -beginning therapies today 2. DVT Prophylaxis/Anticoagulation: Eliquis 3. Pain  Management:Neurontin 400 mg daily, Fioricet for headaches as needed  -arthritis right wrist  -voltaren gel, ice  -order wrist splint to immobilize 4. Mood:BuSpar 15 mg twice daily, Zoloft 100 mg daily 5. Neuropsych: This patientiscapable of making decisions on herown behalf. 6. Skin/Wound Care:Routine skin checks 7. Fluids/Electrolytes/Nutrition:Routine in and outs with follow-up chemistries. Encourage PO  -I personally reviewed all of the patient's labs today, and lab work is within normal limits. 8.Hypertension. Lopressor 100 mg twice daily, Cozaar 100 mg daily, Norvasc 10 mg daily  -borderline control at present 9.Diabetes mellitus with peripheral neuropathy. Hemoglobin A1c 5.7. Glucophage 1000 mg twice daily.   -reasonable control thus far 10/16 10.COPD with remote tobacco abuse. No shortness of breath reported 11.Hyperlipidemia. Pravachol 12. Low grad temp: observe only for now  -OOB, IS   LOS: 1 days A FACE TO FACE EVALUATION WAS PERFORMED  Meredith Staggers 01/04/2018, 8:57 AM

## 2018-01-04 NOTE — Progress Notes (Signed)
Pt's 02 sats dropped to the high 70s at 2011 and slowly came up to the low 90s within a few seconds. Pt had been in a deep sleep at the time this occurred. She stated that she wears 2L of 02 at home during the night. Spoke with respiratory and decided to place pt on 2L of 02 prophylactically.

## 2018-01-04 NOTE — Progress Notes (Signed)
Social Work Assessment and Plan  Patient Details  Name: Catherine Greene MRN: [id number] Date of Birth: 12/02/1940  Today's Date: 01/04/2018  Problem List:  Patient Active Problem List   Diagnosis Date Noted  . Chronic pulmonary embolism ([place]) 01/03/2018  . Carotid stenosis, right 01/03/2018  . 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 Acute ischemic stroke ([place]) 12/28/2017  . B12 deficiency 10/12/2016  . Procedure refused 10/12/2016  . Dysthymia 08/31/2016  . Controlled substance agreement signed 05/03/2016  . URI (upper respiratory infection) 05/03/2016  . Chronic right shoulder pain 02/02/2016  . Abscess of face 09/11/2015  . Nausea 09/11/2015  . Ischemic stroke ([place]) 08/30/2015  . Acute maxillary sinusitis 04/02/2015  . Chronic obstructive pulmonary disease ([place]) 03/04/2015  . Fatigue 02/20/2015  . Hypoxia 02/20/2015  . Left shoulder pain 10/21/2014  . Breast cancer screening, high risk patient 06/20/2014  . Polyneuropathy associated with underlying disease ([place]) 06/20/2014  . Back pain 11/15/2013  . Myalgia 11/15/2013  . Proteinuria 11/15/2013  . Anxiety 07/02/2013  . Colonoscopy refused 05/21/2013  . Diarrhea 05/21/2013  . Weight loss 05/21/2013  . 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 Carotid occlusion, left 07/04/2012  . Peripheral vascular disease ([place]) 07/04/2012  . Scoliosis (and kyphoscoliosis), idiopathic 07/04/2012  . Spondylolisthesis, congenital 07/04/2012  . Hyperlipidemia 07/04/2012  . Type II diabetes mellitus ([place]) 07/04/2012  . Diabetes mellitus, type II ([place]) 03/13/2010  . HYPERLIPIDEMIA-MIXED 03/13/2010  . Essential hypertension 03/13/2010  . CAD (coronary artery disease), native coronary artery 03/13/2010   Past Medical History:  Past Medical History:  Diagnosis Date  . CAD (coronary artery disease)   . Diabetes mellitus    type 2  . Hyperlipidemia   . Hypertension   . Past heart attack 1998   Past  Surgical History:  Past Surgical History:  Procedure Laterality Date  . APPENDECTOMY    . BACK SURGERY     x 2  . CESAREAN SECTION     x 3  . IR ANGIO INTRA EXTRACRAN SEL COM CAROTID INNOMINATE BILAT MOD SED  12/28/2017  . IR ANGIO VERTEBRAL SEL SUBCLAVIAN INNOMINATE UNI R MOD SED  12/28/2017  . IR ANGIO VERTEBRAL SEL VERTEBRAL UNI L MOD SED  12/28/2017  . NECK SURGERY    . RADIOLOGY WITH ANESTHESIA N/A 12/28/2017   Procedure: IR WITH ANESTHESIA;  Surgeon: Luanne Bras, MD;  Location: [facility];  Service: Radiology;  Laterality: N/A;  . Skin cancer removal face    . TONSILLECTOMY     Social History:  reports that she quit smoking about 21 years ago. Her smoking use included cigarettes. She has a 80.00 pack-year smoking history. She has never used smokeless tobacco. She reports that she does not drink alcohol or use drugs.  Family / Support Systems Marital Status: Widow/Widower How Long?: about 35 years Patient Roles: Partner, Parent Spouse/Significant Other: Gwyndolyn Saxon "Boo Boo" - boyfriend of 27 years Children: Evia Goldsmith - dtr - [phone] (h); [phone] (m) Other Supports: another son in [place]; Her other son died about 3 months ago Anticipated Caregiver: daughter and pt's boyfriend Ability/Limitations of Caregiver: none Caregiver Availability: 24/7 Family Dynamics: supportive family per pt  Social History Preferred language: English Religion: Baptist Read: Yes Write: Yes Employment Status: Retired Public relations account executive Issues: none reported Guardian/Conservator: N/A - MD has determined that pt is capable of making her own decisions.   Abuse/Neglect Abuse/Neglect Assessment Can Be Completed: Yes Physical Abuse: Denies Verbal Abuse: Denies  Sexual Abuse: Denies Exploitation of patient/patient's resources: Denies Self-Neglect: Denies  Emotional Status Pt's affect, behavior and adjustment status: Pt reports wanting to go home and this makes her feel a little  down, but overall she reports feeling okay emotionally and is motivated to get better. Recent Psychosocial Issues: Pt with two previous strokes, one as recent as June, and a heart attack.  One of her sons died 3 months ago following a stroke. Psychiatric History: Pt reports a hx of anxiety and depression and that she has been on medications since her last stroke. Substance Abuse History: none reported  Patient / Family Perceptions, Expectations & Goals Pt/Family understanding of illness & functional limitations: Pt has a good understanding of her condition/limitations.  CSW will f/u with dtr to address her understanding. Premorbid pt/family roles/activities: Pt enjoys going out to eat with her boyfriend and likes to watch TV. Anticipated changes in roles/activities/participation: Pt would like to resume activities as she is able Pt/family expectations/goals: "I want to be able to walk and get around by myself."  [employer]: None Premorbid Home Care/DME Agencies: Other (Comment)(Pt has used [facility] and would like to resume services again at d/c.) Transportation available at discharge: family Resource referrals recommended: Support group (specify)(stroke support group)  Discharge Planning Living Arrangements: Children, Other (Comment)(dtr and boyfriend) Support Systems: Spouse/significant other, Children Type of Residence: Private residence Insurance Resources: Multimedia programmer (specify)(United Electrical engineer) Financial Resources: Radio broadcast assistant Screen Referred: No Living Expenses: Own(pt and boyfriend own mobile home together) Money Management: Patient, Significant Other, Family Does the patient have any problems obtaining your medications?: No Home Management: Pt does what she can and then her boyfriend and dtr do the rest. Patient/Family Preliminary Plans: Pt plans to go home with her dtr and boyfriend. Social Work Anticipated  Follow Up Needs: HH/OP, Support Group Expected length of stay: 7 to 10 days  Clinical Impression CSW met with pt to introduce self and role of CSW, as well as to complete assessment.  Pt wants to be at home, but recognizes that she needs to be here for therapy. Pt will have 24/7 supervision at home between her boyfriend and dtr.  She has about 6 stairs to enter double wide mobile home.  She did these independently using the rail PTA.  No family was present during visit, so CSW will call pt's dtr to introduce self.  Pt with no concerns/questions/needs at this time.  CSW will continue to follow and assist as needed.   Nolberto Cheuvront, Silvestre Mesi 01/04/2018, 11:36 AM

## 2018-01-04 NOTE — Discharge Instructions (Addendum)
Inpatient Rehab Discharge Instructions  Catherine Greene Discharge date and time: No discharge date for patient encounter.   Activities/Precautions/ Functional Status: Activity: activity as tolerated Diet: diabetic diet Wound Care: none needed Functional status:  ___ No restrictions     ___ Walk up steps independently ___ 24/7 supervision/assistance   ___ Walk up steps with assistance ___ Intermittent supervision/assistance  ___ Bathe/dress independently  [place]:   Home Health:   PT     OT        Agency:  [facility]    Phone:  [phone]   Medical Equipment/Items Ordered: You have all recommended equipment at home already.   GENERAL COMMUNITY RESOURCES FOR PATIENT/FAMILY: Support Groups:  [facility] Stroke Support Group Meets the second Thursday of each month from 6 - 7 PM (except June, July, August) [facility], [place] For more information, call [phone]  Information on my medicine - ELIQUIS (apixaban)  This medication education was reviewed with me or my healthcare representative as part of my discharge preparation.    Why was Eliquis prescribed for you? Eliquis was prescribed for you to reduce the risk of forming blood clots that can cause a stroke if you have a medical condition called atrial fibrillation (a type of irregular heartbeat) OR to reduce the risk of a blood clots forming after orthopedic surgery.  What do You need to know about Eliquis ? Take your Eliquis TWICE DAILY - one tablet in the morning and one tablet in the evening with or without food.  It would be best to take the doses about the same time each day.  If you have difficulty swallowing the tablet whole please discuss with your pharmacist how to take the medication safely.  Take Eliquis exactly as prescribed by your doctor and DO NOT stop taking Eliquis without talking to the  doctor who prescribed the medication.  Stopping may increase your risk of developing a new clot or stroke.  Refill your prescription before you run out.  After discharge, you should have regular check-up appointments with your healthcare provider that is prescribing your Eliquis.  In the future your dose may need to be changed if your kidney function or weight changes by a significant amount or as you get older.  What do you do if you miss a dose? If you miss a dose, take it as soon as you remember on the same day and resume taking twice daily.  Do not take more than one dose of ELIQUIS at the same time.  Important Safety Information A possible side effect of Eliquis is bleeding. You should call your healthcare provider right away if you experience any of the following: ? Bleeding from an injury or your nose that does not stop. ? Unusual colored urine (red or dark brown) or unusual colored stools (red or black). ? Unusual bruising for unknown reasons. ? A serious fall or if you hit your head (even if there is no bleeding).  Some medicines may interact with Eliquis and might increase your risk of bleeding or clotting while on Eliquis. To help avoid this, consult your healthcare provider or pharmacist prior to using any new prescription or non-prescription medications, including herbals, vitamins, non-steroidal anti-inflammatory drugs (NSAIDs) and supplements.  This website has more information on Eliquis (apixaban): [email or web address].    ___ Walk with walker     _x__ Bathe/dress with assistance ___ Walk Independently  ___ Shower independently ___ Walk with assistance    ___ Shower with assistance ___ No alcohol     ___ Return to work/school ________  Special Instructions: No smoking STROKE/TIA DISCHARGE INSTRUCTIONS SMOKING Cigarette smoking nearly doubles your risk of having a stroke & is the single most alterable risk factor  If you smoke or have smoked in the last 12 months, you  are advised to quit smoking for your health.  Most of the excess cardiovascular risk related to smoking disappears within a year of stopping.  Ask you doctor about anti-smoking medications  [place] Quit Line: 1-800-QUIT NOW  Free Smoking Cessation Classes [phone]  CHOLESTEROL Know your levels; limit fat & cholesterol in your diet  Lipid Panel     Component Value Date/Time   CHOL 205 (H) 12/29/2017 0455   TRIG 350 (H) 12/30/2017 0337   HDL 37 (L) 12/29/2017 0455   CHOLHDL 5.5 12/29/2017 0455   VLDL 61 (H) 12/29/2017 0455   LDLCALC 107 (H) 12/29/2017 0455      Many patients benefit from treatment even if their cholesterol is at goal.  Goal: Total Cholesterol (CHOL) less than 160  Goal:  Triglycerides (TRIG) less than 150  Goal:  HDL greater than 40  Goal:  LDL (LDLCALC) less than 100   BLOOD PRESSURE American Stroke Association blood pressure target is less that 120/80 mm/Hg  Your discharge blood pressure is:  BP: (!) 152/69  Monitor your blood pressure  Limit your salt and alcohol intake  Many individuals will require more than one medication for high blood pressure  DIABETES (A1c is a blood sugar average for last 3 months) Goal HGBA1c is under 7% (HBGA1c is blood sugar average for last 3 months)  Diabetes:     Lab Results  Component Value Date   HGBA1C 5.7 (H) 12/29/2017     Your HGBA1c can be lowered with medications, healthy diet, and exercise.  Check your blood sugar as directed by your physician  Call your physician if you experience unexplained or low blood sugars.  PHYSICAL ACTIVITY/REHABILITATION Goal is 30 minutes at least 4 days per week  Activity: Increase activity slowly, Therapies: Physical Therapy: Home Health Return to work:   Activity decreases your risk of heart attack and stroke and makes your heart stronger.  It helps control your weight and blood pressure; helps you relax and can improve your mood.  Participate in a regular exercise  program.  Talk with your doctor about the best form of exercise for you (dancing, walking, swimming, cycling).  DIET/WEIGHT Goal is to maintain a healthy weight  Your discharge diet is:  Diet Order            Diet heart healthy/carb modified Room service appropriate? Yes; Fluid consistency: Thin  Diet effective now              liquids Your height is:  Height: 5\' 2"  (157.5 cm) Your current weight is: Weight: 68 kg Your Body Mass Index (BMI) is:  BMI (Calculated): 27.41  Following the type of diet specifically designed for you will help prevent another stroke.  Your goal weight range is:    Your goal Body Mass Index (BMI) is 19-24.  Healthy food habits can help reduce 3 risk factors for stroke:  High cholesterol, hypertension, and excess weight.  RESOURCES Stroke/Support Group:  Call [phone]   STROKE EDUCATION PROVIDED/REVIEWED AND GIVEN TO PATIENT Stroke warning signs and symptoms How to activate emergency medical system (call 911).  Medications prescribed at discharge. Need for follow-up after discharge. Personal risk factors for stroke. Pneumonia vaccine given:  Flu vaccine given:  My questions have been answered, the writing is legible, and I understand these instructions.  I will adhere to these goals & educational materials that have been provided to me after my discharge from the hospital.      My questions have been answered and I understand these instructions. I will adhere to these goals and the provided educational materials after my discharge from the hospital.  Patient/Caregiver Signature _______________________________ Date __________  Clinician Signature _______________________________________ Date __________  Please bring this form and your medication list with you to all your follow-up doctor's appointments.

## 2018-01-04 NOTE — Evaluation (Signed)
Occupational Therapy Assessment and Plan  Patient Details  Name: Catherine Greene MRN: [id number] Date of Birth: 06/12/1940  OT Diagnosis: hemiplegia affecting non-dominant side Rehab Potential: Rehab Potential (ACUTE ONLY): Good ELOS: 10-14 days   Today's Date: 01/04/2018 OT Individual Time: 1109-1215 OT Individual Time Calculation (min): 66 min     Problem List:  Patient Active Problem List   Diagnosis Date Noted  . Chronic pulmonary embolism (HCC) 01/03/2018  . Carotid stenosis, right 01/03/2018  . Right middle cerebral artery stroke (HCC) 01/03/2018  . Acute ischemic stroke (HCC) 12/28/2017  . B12 deficiency 10/12/2016  . Procedure refused 10/12/2016  . Dysthymia 08/31/2016  . Controlled substance agreement signed 05/03/2016  . URI (upper respiratory infection) 05/03/2016  . Chronic right shoulder pain 02/02/2016  . Abscess of face 09/11/2015  . Nausea 09/11/2015  . Ischemic stroke (HCC) 08/30/2015  . Acute maxillary sinusitis 04/02/2015  . Chronic obstructive pulmonary disease (HCC) 03/04/2015  . Fatigue 02/20/2015  . Hypoxia 02/20/2015  . Left shoulder pain 10/21/2014  . Breast cancer screening, high risk patient 06/20/2014  . Polyneuropathy associated with underlying disease (HCC) 06/20/2014  . Back pain 11/15/2013  . Myalgia 11/15/2013  . Proteinuria 11/15/2013  . Anxiety 07/02/2013  . Colonoscopy refused 05/21/2013  . Diarrhea 05/21/2013  . Weight loss 05/21/2013  . 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 Carotid occlusion, left 07/04/2012  . Peripheral vascular disease (HCC) 07/04/2012  . Scoliosis (and kyphoscoliosis), idiopathic 07/04/2012  . Spondylolisthesis, congenital 07/04/2012  . Hyperlipidemia 07/04/2012  . Type II diabetes mellitus (HCC) 07/04/2012  . Diabetes mellitus, type II (HCC) 03/13/2010  . HYPERLIPIDEMIA-MIXED 03/13/2010  . Essential hypertension 03/13/2010  . CAD (coronary artery disease), native coronary artery 03/13/2010     Past Medical History:  Past Medical History:  Diagnosis Date  . CAD (coronary artery disease)   . Diabetes mellitus    type 2  . Hyperlipidemia   . Hypertension   . Past heart attack 1998   Past Surgical History:  Past Surgical History:  Procedure Laterality Date  . APPENDECTOMY    . BACK SURGERY     x 2  . CESAREAN SECTION     x 3  . IR ANGIO INTRA EXTRACRAN SEL COM CAROTID INNOMINATE BILAT MOD SED  12/28/2017  . IR ANGIO VERTEBRAL SEL SUBCLAVIAN INNOMINATE UNI R MOD SED  12/28/2017  . IR ANGIO VERTEBRAL SEL VERTEBRAL UNI L MOD SED  12/28/2017  . NECK SURGERY    . RADIOLOGY WITH ANESTHESIA N/A 12/28/2017   Procedure: IR WITH ANESTHESIA;  Surgeon: Deveshwar, Sanjeev, MD;  Location: [facility] OR;  Service: Radiology;  Laterality: N/A;  . Skin cancer removal face    . 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 Per chart review patient lives with spouse. Mobile home with 5 steps to entry. Presented 12/28/2017 with complaints of left-sided weakness. Cranial CT scan showed right occipital lobe infarction new since prior exam. No acute anterior circulation infarction. MRI/MRA showed scattered foci of acute subacute cortical infarcts involving the right frontal, parietal, temporal and occipital lobes. 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 Echocardiogram with ejection fraction of 70% no wall motion abnormalities grade 1 diastolic   dysfunction. Underwent partial revascularization of left ICA terminus per interventional radiology. TCD bubble study completed clinically insignificant findings of trivial PFO. Neurology follow-up currently  maintained on aspirin as well as Eliquis for CVA prophylaxis. Tolerating a regular diet. Therapy evaluations completed with recommendations of physical medicine rehab consult. Patient was admitted for a comprehensive rehab program.  Patient transferred to CIR on 01/03/2018 .    Patient currently requires mod with basic self-care skills secondary to muscle weakness, decreased cardiorespiratoy endurance and decreased oxygen support, decreased visual perceptual skills, decreased attention to left, decreased motor planning and ideational apraxia, decreased awareness, decreased problem solving, decreased safety awareness, decreased memory and delayed processing and decreased standing balance, decreased postural control, hemiplegia and decreased balance strategies.  Prior to hospitalization, patient could complete ADLs with modified independent .  Patient will benefit from skilled intervention to decrease level of assist with basic self-care skills prior to discharge home with care partner.  Anticipate patient will require 24 hour supervision and follow up home health.  OT - End of Session Activity Tolerance: Tolerates < 10 min activity with changes in vital signs Endurance Deficit: Yes Endurance Deficit Description: generalized weakness OT Assessment Rehab Potential (ACUTE ONLY): Good OT Patient demonstrates impairments in the following area(s): Balance;Endurance;Vision;Perception;Motor;Cognition;Safety;Pain OT Basic ADL's Functional Problem(s): Eating;Grooming;Bathing;Toileting;Dressing OT Transfers Functional Problem(s): Tub/Shower;Toilet OT Additional Impairment(s): Fuctional Use of Upper Extremity OT Plan OT Intensity: Minimum of 1-2 x/day, 45 to 90 minutes OT Frequency: 5 out of 7 days OT Duration/Estimated Length of Stay: 10-14 days OT Treatment/Interventions: Balance/vestibular training;Community reintegration;Disease mangement/prevention;Functional electrical stimulation;Neuromuscular  re-education;Patient/family education;Self Care/advanced ADL retraining;Splinting/orthotics;Therapeutic Exercise;UE/LE Coordination activities;Wheelchair propulsion/positioning;Cognitive remediation/compensation;Discharge planning;DME/adaptive equipment instruction;Functional mobility training;Pain management;Psychosocial support;Skin care/wound managment;Therapeutic Activities;UE/LE Strength taining/ROM;Visual/perceptual remediation/compensation OT Self Feeding Anticipated Outcome(s): set up OT Basic Self-Care Anticipated Outcome(s): (S) OT Toileting Anticipated Outcome(s): (S) OT Bathroom Transfers Anticipated Outcome(s): (S) OT Recommendation Recommendations for Other Services: Speech consult;Neuropsych consult Patient destination: Home Follow Up Recommendations: Home health OT Equipment Recommended: To be determined   Skilled Therapeutic Intervention Skilled OT evaluation completed. Pt and her family educated re OT POC, ELOS, and condition insight. Pt completed dressing tasks EOB with minimal cueing and max A d/t fatigue. Pt experienced oxygen de-saturation with mobility to 85 %. Rn alerted. Dynavision completed to assess visual deficits, with deficits including L neglect and L visual motor. Pt completed all transfers with min-mod A depending on progressing fatigue levels. Pt returned to bed and left supine with RN present.   OT Evaluation Precautions/Restrictions  Precautions Precautions: Fall Precaution Comments: L hemi Restrictions Weight Bearing Restrictions: No General Chart Reviewed: Yes Family/Caregiver Present: Yes Vital Signs Therapy Vitals Temp: 98.8 F (37.1 C) Temp Source: Oral Pulse Rate: 81 Resp: 18 BP: (!) 115/49 Patient Position (if appropriate): Lying Oxygen Therapy SpO2: 91 % O2 Device: Room Air Pain Pain Assessment Pain Scale: 0-10 Pain Score: 5  Pain Type: Acute pain Pain Location: Hand Pain Orientation: Left Pain Descriptors / Indicators:  Aching Pain Intervention(s): RN made aware Home Living/Prior Functioning Home Living Family/patient expects to be discharged to:: Private residence Living Arrangements: Children, Other (Comment)(dtr and boyfriend) Available Help at Discharge: Family, Available 24 hours/day Type of Home: Mobile home Home Access: Stairs to enter Entrance Stairs-Number of Steps: several Entrance Stairs-Rails: Right, Left Home Layout: One level Bathroom Shower/Tub: Walk-in shower Bathroom Toilet: Standard Bathroom Accessibility: Yes  Lives With: Significant other, Daughter IADL History Homemaking Responsibilities: Yes Meal Prep Responsibility: Secondary Laundry Responsibility: Secondary Cleaning Responsibility: Secondary Bill Paying/Finance Responsibility: Secondary Shopping Responsibility:   Secondary Occupation: Retired Leisure and Hobbies: Watching TV Prior Function Level of Independence: Requires assistive device for independence, Independent with basic ADLs  Able to [place]?: Yes Driving: No Vocation: Retired Comments: pt did have a gait disturbance, but independent in the home with/without AD and cane use outdoors ADL ADL Eating: Set up Where Assessed-Eating: Bed level Grooming: Minimal cueing, Supervision/safety Where Assessed-Grooming: Sitting at sink Upper Body Bathing: Minimal assistance, Minimal cueing Where Assessed-Upper Body Bathing: Edge of bed Lower Body Bathing: Moderate assistance, Minimal cueing Where Assessed-Lower Body Bathing: Edge of bed Upper Body Dressing: Maximal assistance Where Assessed-Upper Body Dressing: Edge of bed Lower Body Dressing: Maximal assistance Where Assessed-Lower Body Dressing: Edge of bed Toileting: Maximal assistance Where Assessed-Toileting: Glass blower/designer: Moderate assistance Toilet Transfer Method: Counselling psychologist: Energy manager: Not assessed Social research officer, government: Not  assessed Vision Baseline Vision/History: Wears glasses Wears Glasses: At all times Patient Visual Report: Blurring of vision Vision Assessment?: Yes Eye Alignment: Impaired (comment) Ocular Range of Motion: Impaired-to be further tested in functional context(L eye not tracking) Alignment/Gaze Preference: Head tilt;Gaze right Tracking/Visual Pursuits: Decreased smoothness of horizontal tracking;Decreased smoothness of vertical tracking;Left eye does not track laterally;Left eye does not track medially Saccades: Additional head turns occurred during testing;Decreased speed of saccadic movement Visual Fields: Left visual field deficit Perception  Perception: Impaired Inattention/Neglect: Does not attend to left visual field Praxis Praxis: Impaired Praxis Impairment Details: Motor planning Cognition Overall Cognitive Status: Impaired/Different from baseline Arousal/Alertness: Awake/alert Orientation Level: Person;Place;Situation Person: Oriented Place: Oriented Situation: Oriented Year: 2019 Month: October Day of Week: Correct Memory: Impaired Memory Impairment: Decreased short term memory;Decreased recall of new information Decreased Short Term Memory: Verbal basic;Functional basic Immediate Memory Recall: Sock;Blue;Bed Memory Recall: Blue Memory Recall Blue: With Cue Attention: Selective Sustained Attention: Appears intact Selective Attention: Appears intact Awareness: Impaired Awareness Impairment: Emergent impairment;Anticipatory impairment Problem Solving: Impaired Problem Solving Impairment: Functional basic Executive Function: Reasoning;Sequencing Reasoning: Impaired Reasoning Impairment: Verbal basic Sequencing: Impaired Sequencing Impairment: Verbal complex Safety/Judgment: Appears intact Sensation Sensation Light Touch: Appears Intact Coordination Gross Motor Movements are Fluid and Coordinated: No Fine Motor Movements are Fluid and Coordinated:  No Coordination and Movement Description: generalized weakness Finger Nose Finger Test: Slow and delayed, overshooting Motor  Motor Motor: Hemiplegia;Other (comment) Motor - Skilled Clinical Observations: generalized weakness and L hemi Mobility  Bed Mobility Bed Mobility: Rolling Right;Rolling Left;Supine to Sit Rolling Right: Minimal Assistance - Patient > 75% Rolling Left: Minimal Assistance - Patient > 75% Supine to Sit: Minimal Assistance - Patient > 75% Transfers Sit to Stand: Moderate Assistance - Patient 50-74% Stand to Sit: Minimal Assistance - Patient > 75%  Trunk/Postural Assessment  Cervical Assessment Cervical Assessment: Exceptions to WFL(forward head) Thoracic Assessment Thoracic Assessment: Exceptions to WFL(kyphotic posture) Lumbar Assessment Lumbar Assessment: Exceptions to WFL(posterior pelvic tilt) Postural Control Postural Control: Deficits on evaluation  Balance Balance Balance Assessed: Yes Static Sitting Balance Static Sitting - Balance Support: Feet supported;No upper extremity supported Static Sitting - Level of Assistance: 5: Stand by assistance Dynamic Sitting Balance Dynamic Sitting - Balance Support: Feet supported;No upper extremity supported Dynamic Sitting - Level of Assistance: 5: Stand by assistance Dynamic Sitting - Balance Activities: Reaching across midline Static Standing Balance Static Standing - Balance Support: Bilateral upper extremity supported;During functional activity Static Standing - Level of Assistance: 4: Min assist Dynamic Standing Balance Dynamic Standing - Balance Support: During functional activity;Bilateral upper extremity supported Dynamic Standing - Level of Assistance: 4: Min assist Dynamic  Standing - Balance Activities: Reaching across midline Extremity/Trunk Assessment RUE Assessment RUE Assessment: Exceptions to [facility] General Strength Comments: 3/5 overall. Grip strength: 20 lbs LUE Assessment LUE Assessment:  Exceptions to [facility] Active Range of Motion (AROM) Comments: Shoulder flex: 50 degrees, elbow WFL General Strength Comments: Grip Strength: <1 lb LUE Body System: Neuro Brunstrum levels for arm and hand: Arm;Hand Brunstrum level for arm: Stage III Synergy is performed voluntarily Brunstrum level for hand: Stage IV Movements deviating from synergies     Refer to Care Plan for Long Term Goals  Recommendations for other services: Neuropsych   Discharge Criteria: Patient will be discharged from OT if patient refuses treatment 3 consecutive times without medical reason, if treatment goals not met, if there is a change in medical status, if patient makes no progress towards goals or if patient is discharged from hospital.  The above assessment, treatment plan, treatment alternatives and goals were discussed and mutually agreed upon: by patient and by family  Curtis Sites 01/04/2018, 2:53 PM

## 2018-01-04 NOTE — Evaluation (Signed)
Speech Language Pathology Assessment and Plan  Patient Details  Name: Catherine Greene MRN: [id number] Date of Birth: 1940/09/06  SLP Diagnosis: Cognitive Impairments  Rehab Potential: Excellent ELOS: 7-10 days     Today's Date: 01/04/2018 SLP Individual Time: 8841-6606 SLP Individual Time Calculation (min): 55 min   Problem List:  Patient Active Problem List   Diagnosis Date Noted  . Chronic pulmonary embolism ([place]) 01/03/2018  . Carotid stenosis, right 01/03/2018  . Right middle cerebral artery stroke ([place]) 01/03/2018  . Acute ischemic stroke ([place]) 12/28/2017  . B12 deficiency 10/12/2016  . Procedure refused 10/12/2016  . Dysthymia 08/31/2016  . Controlled substance agreement signed 05/03/2016  . URI (upper respiratory infection) 05/03/2016  . Chronic right shoulder pain 02/02/2016  . Abscess of face 09/11/2015  . Nausea 09/11/2015  . Ischemic stroke ([place]) 08/30/2015  . Acute maxillary sinusitis 04/02/2015  . Chronic obstructive pulmonary disease ([place]) 03/04/2015  . Fatigue 02/20/2015  . Hypoxia 02/20/2015  . Left shoulder pain 10/21/2014  . Breast cancer screening, high risk patient 06/20/2014  . Polyneuropathy associated with underlying disease ([place]) 06/20/2014  . Back pain 11/15/2013  . Myalgia 11/15/2013  . Proteinuria 11/15/2013  . Anxiety 07/02/2013  . Colonoscopy refused 05/21/2013  . Diarrhea 05/21/2013  . Weight loss 05/21/2013  . 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 Carotid occlusion, left 07/04/2012  . Peripheral vascular disease ([place]) 07/04/2012  . Scoliosis (and kyphoscoliosis), idiopathic 07/04/2012  . Spondylolisthesis, congenital 07/04/2012  . Hyperlipidemia 07/04/2012  . Type II diabetes mellitus ([place]) 07/04/2012  . Diabetes mellitus, type II ([place]) 03/13/2010  . HYPERLIPIDEMIA-MIXED 03/13/2010  . Essential hypertension 03/13/2010  . CAD (coronary artery disease), native coronary artery 03/13/2010   Past Medical History:   Past Medical History:  Diagnosis Date  . CAD (coronary artery disease)   . Diabetes mellitus    type 2  . Hyperlipidemia   . Hypertension   . Past heart attack 1998   Past Surgical History:  Past Surgical History:  Procedure Laterality Date  . APPENDECTOMY    . BACK SURGERY     x 2  . CESAREAN SECTION     x 3  . IR ANGIO INTRA EXTRACRAN SEL COM CAROTID INNOMINATE BILAT MOD SED  12/28/2017  . IR ANGIO VERTEBRAL SEL SUBCLAVIAN INNOMINATE UNI R MOD SED  12/28/2017  . IR ANGIO VERTEBRAL SEL VERTEBRAL UNI L MOD SED  12/28/2017  . NECK SURGERY    . RADIOLOGY WITH ANESTHESIA N/A 12/28/2017   Procedure: IR WITH ANESTHESIA;  Surgeon: Luanne Bras, MD;  Location: [place];  Service: Radiology;  Laterality: N/A;  . Skin cancer removal face    . 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 Per chart review patient lives with spouse. Mobile home with 5 steps to entry. Presented 12/28/2017 with complaints of left-sided weakness. Cranial CT scan showed right occipital lobe infarction new since prior exam. No acute anterior circulation infarction. MRI/MRA showed scattered foci of acute subacute cortical infarcts involving the right frontal, parietal, temporal and occipital lobes. 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 Echocardiogram with ejection fraction of 70% no wall motion abnormalities grade 1 diastolic dysfunction. Underwent partial  revascularization of left ICA terminus per interventional radiology. TCD bubble study completed clinically insignificant findings of trivial PFO. Neurology follow-up currently maintained on  aspirin as well as Eliquis for CVA prophylaxis. Tolerating a regular diet. Therapy evaluations completed with recommendations of physical medicine rehab consult. Patient was admitted for a comprehensive rehab program 01/03/18.  Patient demonstrates mild cognitive impairments impacting short-term recall and functional problem solving. Patient with baseline memory deficits from previous CVA, however, reports memory appears more impaired since recent CVA. Mild problem solving deficits noted, especially in regards to compensating for visual deficits during functional tasks. Patient would benefit from skilled SLP intervention to maximize her cognitive functioning and overall functional independence prior to discharge.    Skilled Therapeutic Interventions          Administered a cognitive-linguistic evaluation, please see above for details. Educated patient in regards to her current cognitive functioning and goals of skilled SLP intervention. She verbalized understanding and agreement.   SLP Assessment  Patient will need skilled [place] Pathology Services during CIR admission    Recommendations  Oral Care Recommendations: Oral care BID Patient destination: Home Follow up Recommendations: 24 hour supervision/assistance;Home Health SLP Equipment Recommended: None recommended by SLP    SLP Frequency 3 to 5 out of 7 days   SLP Duration  SLP Intensity  SLP Treatment/Interventions 7-10 days   Minumum of 1-2 x/day, 30 to 90 minutes  Cognitive remediation/compensation;Cueing hierarchy;Functional tasks;Patient/family education;Therapeutic Activities;Internal/external aids;Environmental controls    Pain No/Denies Pain   Short Term Goals: Week 1: SLP Short Term Goal 1 (Week 1): STGs=LTGs due to short length of stay   Refer to Care Plan for Long Term Goals  Recommendations for other services: Neuropsych  Discharge Criteria: Patient will be discharged from SLP if patient refuses  treatment 3 consecutive times without medical reason, if treatment goals not met, if there is a change in medical status, if patient makes no progress towards goals or if patient is discharged from hospital.  The above assessment, treatment plan, treatment alternatives and goals were discussed and mutually agreed upon: by patient  Saundra Gin 01/04/2018, 10:45 AM

## 2018-01-04 NOTE — Progress Notes (Signed)
[facility] Individual Statement of Services  Patient Name:  Catherine Greene  Date:  01/04/2018  Welcome to the [facility].  Our goal is to provide you with an individualized program based on your diagnosis and situation, designed to meet your specific needs.  With this comprehensive rehabilitation program, you will be expected to participate in at least 3 hours of rehabilitation therapies Monday-Friday, with modified therapy programming on the weekends.  Your rehabilitation program will include the following services:  Physical Therapy (PT), Occupational Therapy (OT), Speech Therapy (ST), 24 hour per day rehabilitation nursing, Case Management (Social Worker), Rehabilitation Medicine, Nutrition Services and Pharmacy Services  Weekly team conferences will be held on Tuesdays to discuss your progress.  Your Social Worker will talk with you frequently to get your input and to update you on team discussions.  Team conferences with you and your family in attendance may also be held.  Expected length of stay:  10 to 14 days  Overall anticipated outcome:  Supervision and minimal assistance with stairs  Depending on your progress and recovery, your program may change. Your Social Worker will coordinate services and will keep you informed of any changes. Your Social Worker's name and contact numbers are listed  below.  The following services may also be recommended but are not provided by the [facility] will be made to provide these services after discharge if needed.  Arrangements include referral to agencies that provide these services.  Your insurance has been verified to be:  [employer] Your primary doctor is:  Dr. Alean Rinne  Pertinent information will be shared with your doctor and your insurance  company.  Social Worker:  Alfonse Alpers, LCSW  [phone] or [phone]  Information discussed with and copy given to patient by: Trey Sailors, 01/04/2018, 11:42 AM

## 2018-01-05 ENCOUNTER — Inpatient Hospital Stay (HOSPITAL_COMMUNITY): Payer: Medicare Other | Admitting: Speech Pathology

## 2018-01-05 ENCOUNTER — Inpatient Hospital Stay (HOSPITAL_COMMUNITY): Payer: Medicare Other

## 2018-01-05 ENCOUNTER — Inpatient Hospital Stay (HOSPITAL_COMMUNITY): Payer: Medicare Other | Admitting: Physical Therapy

## 2018-01-05 LAB — GLUCOSE, CAPILLARY
GLUCOSE-CAPILLARY: 125 mg/dL — AB (ref 70–99)
GLUCOSE-CAPILLARY: 113 mg/dL — AB (ref 70–99)
Glucose-Capillary: 123 mg/dL — ABNORMAL HIGH (ref 70–99)

## 2018-01-05 MED ORDER — DICLOFENAC SODIUM 1 % TD GEL
2.0000 g | Freq: Four times a day (QID) | TRANSDERMAL | Status: DC
  Administered 2018-01-05 – 2018-01-19 (×55): 2 g via TOPICAL
  Filled 2018-01-05 (×2): qty 100

## 2018-01-05 NOTE — Progress Notes (Signed)
Just got pt up to use the BSC, 02 sats 98% on 2L.

## 2018-01-05 NOTE — Progress Notes (Signed)
Physical Therapy Session Note  Patient Details  Name: Catherine Greene MRN: [id number] Date of Birth: 02-13-1941  Today's Date: 01/05/2018 PT Individual Time: 1300-1410 PT Individual Time Calculation (min): 70 min   Short Term Goals: Week 1:  PT Short Term Goal 1 (Week 1): Pt will perform bed mobility with min assist consistently  PT Short Term Goal 2 (Week 1): Pt will ambulate 173ft with min assist and LRAD  PT Short Term Goal 3 (Week 1): Pt will perform bed<>WC transfers with supervision assist LRAD  PT Short Term Goal 4 (Week 1): Pt will initate WC mobility  PT Short Term Goal 5 (Week 1): Pt will attend to L side 50% the time with min cues to improve safety of mobility tasks   Skilled Therapeutic Interventions/Progress Updates:    pt performs stand pivot transfers throughout session with min A, facilitation for wt shifts and cues for sequencing. Gait with RW multiple attempts of 100' with min A for balance, mod A for RW control due to Lt UE weakness and Lt inattention. Attempted use of Lt UE support for RW with pt continuing to require mod A for RW control.  Gait without AD with mod A with pt requiring facilitation for wt shifts, cues to widen BOS.  Standing side stepping for strength and balance with min A.  otago exercises performed with frequent rest breaks, min A for balance with all SLS tasks.  Pt left in room with alarm set, needs at hand.  Therapy Documentation Precautions:  Precautions Precautions: Fall Precaution Comments: L hemi Restrictions Weight Bearing Restrictions: No Pain: Pt c/o back pain with prolonged standing, eases with rest   Therapy/Group: Individual Therapy  DONAWERTH,KAREN 01/05/2018, 2:13 PM

## 2018-01-05 NOTE — Progress Notes (Signed)
Speech Language Pathology Daily Session Note  Patient Details  Name: Catherine Greene MRN: [id number] Date of Birth: 12/10/1940  Today's Date: 01/05/2018 SLP Individual Time: 1000-1055 SLP Individual Time Calculation (min): 55 min  Short Term Goals: Week 1: SLP Short Term Goal 1 (Week 1): STGs=LTGs due to short length of stay   Skilled Therapeutic Interventions: Skilled treatment session focused on cognitive goals. SLP facilitated session by providing Max A verbal cues for patient to turn her head to the left, minimizing visual distractions and anchoring with a bright color for patient to scan to the left during a basic oral reading task. Patient initially verbalizes, "I can't see" but can complete task with extra time and Max encouragement. Patient appeared lethargic throughout session and required extra time and Min A verbal cues for sequencing during transfer back to bed. Patient left upright in bed with alarm on and all needs within reach. Continue with current plan of care.      Pain Pain Assessment Pain Scale: 0-10 Pain Score: 3  Pain Location: Hand Pain Orientation: Left Pain Descriptors / Indicators: Aching Patients Stated Pain Goal: 3 Pain Intervention(s): Medication (See eMAR);Repositioned  Therapy/Group: Individual Therapy  Maame Dack 01/05/2018, 12:38 PM

## 2018-01-05 NOTE — Progress Notes (Signed)
Occupational Therapy Session Note  Patient Details  Name: Catherine Greene MRN: [id number] Date of Birth: 1940-04-07  Today's Date: 01/05/2018 OT Individual Time: 6812-7517 OT Individual Time Calculation (min): 75 min    Short Term Goals: Week 1:  OT Short Term Goal 1 (Week 1): Pt will don pants EOB with min A OT Short Term Goal 2 (Week 1): Pt will complete toileting tasks with no change in vital signs OT Short Term Goal 3 (Week 1): Pt will transfer to toilet with min A  OT Short Term Goal 4 (Week 1): Pt will don shirt with min A  Skilled Therapeutic Interventions/Progress Updates:    Session focused on b/d tasks at shower level. Pt completed functional mobility to bathroom with min A. Vc for L side attention to RW. Pt required max A for clothing management for toileting. Pt transferred onto TTB in walk in shower with min A. Mod A overall provided for bathing for energy conservation. Skilled monitoring of vitals throughout session, with pt's SpO2 dropping to ~85% with exertion on RA. Following shower 2L [place] administered d/t SpO2 remaining at 85% despite breathing techniques. Pt sat EOB and was provided with prolonged rest break, with SPo2 raising to 100% on 2L [place]. Demo and edu provided re pursed lip breathing techniques and COPD insight. Pt demonstrated ideational apraxia when donning shirt, requiring HOH to correctly sequence steps, max A overall. Pt unable to flex trunk forward enough to thread B LE through pants, requiring max A overall but improvements in pulling up pants in standing. Pt c/o pain in her L hand and neck throughout session but reported this pain as no higher than a 5/10 and requiring no intervention other than occasional rest break. Pt provided with foam blocks and tan theraputty for L NMR while resting in room, with edu provided re use. Pt left sitting up in w/c with RN and MD present.   Therapy Documentation Precautions:  Precautions Precautions: Fall Precaution Comments: L  hemi Restrictions Weight Bearing Restrictions: No  Pain: Pain Assessment Pain Scale: 0-10 Pain Score: 5  Pain Type: Acute pain Pain Location: Hand Pain Orientation: Left Pain Descriptors / Indicators: Aching Pain Onset: On-going Pain Intervention(s): Shower   Therapy/Group: Individual Therapy  Curtis Sites 01/05/2018, 8:47 AM

## 2018-01-05 NOTE — Progress Notes (Addendum)
[place] PHYSICAL MEDICINE & REHABILITATION PROGRESS NOTE   Subjective/Complaints: Left arm feeling a little better. Says that pain medication helping, heat, too  ROS: Patient denies fever, rash, sore throat, blurred vision, nausea, vomiting, diarrhea, cough, shortness of breath or chest pain,  headache, or mood change.   Objective:   Dg Chest 2 View  Result Date: 01/04/2018 CLINICAL DATA:  COPD.  Admitted for stroke EXAM: CHEST - 2 VIEW COMPARISON:  12/28/2017 FINDINGS: Low volume chest with streaky densities at the bases. No Kerley lines, consolidation, effusion, or pneumothorax. Postoperative right neck. Normal heart size. Aortic tortuosity accentuated by rotation. IMPRESSION: Low volume chest with atelectasis at the bases. Electronically Signed   By: Monte Fantasia M.D.   On: 01/04/2018 14:01   Recent Labs    01/03/18 1225 01/04/18 0547  WBC 13.3* 11.0*  HGB 11.3* 11.5*  HCT 35.6* 35.3*  PLT 183 182   Recent Labs    01/03/18 1225 01/04/18 0547  NA 137 136  K 3.8 4.0  CL 100 99  CO2 28 28  GLUCOSE 151* 149*  BUN 21 23  CREATININE 0.97 0.97  CALCIUM 9.2 9.4    Intake/Output Summary (Last 24 hours) at 01/05/2018 5284 Last data filed at 01/05/2018 0900 Gross per 24 hour  Intake 645 ml  Output 250 ml  Net 395 ml     Physical Exam: Vital Signs Blood pressure (!) 118/51, pulse 71, temperature 98 F (36.7 C), resp. rate 18, height 5\' 2"  (1.575 m), weight 68 kg, SpO2 97 %. Constitutional: No distress . Vital signs reviewed. HEENT: EOMI, oral membranes moist Neck: supple Cardiovascular: RRR without murmur. No JVD    Respiratory: CTA Bilaterally without wheezes or rales. Normal effort    GI: BS +, non-tender, non-distended  Musc: left wrist tender around 1/2 [facility] Neurological: Alert. Flat affect. Fair insight and awareness. Normal language. Has a right gaze preference with some left-sided inattention--stable. Likely left HH as well. LUE 2-3/5 prox to distal.  LLE 1-2/5 prox to distal--stable. Decreased sensation distal LUE and LLE Skin: intact. Psych: flat but cooperative    Assessment/Plan: 1. Functional deficits secondary to embolic right MCA infarct which require 3+ hours per day of interdisciplinary therapy in a comprehensive inpatient rehab setting.  Physiatrist is providing close team supervision and 24 hour management of active medical problems listed below.  Physiatrist and rehab team continue to assess barriers to discharge/monitor patient progress toward functional and medical goals  Care Tool:  Bathing  Bathing activity did not occur: (pt declined) Body parts bathed by patient: Right arm, Left arm, Chest, Abdomen, Front perineal area, Buttocks, Right upper leg, Left upper leg, Face   Body parts bathed by helper: Right lower leg, Left lower leg     Bathing assist Assist Level: Moderate Assistance - Patient 50 - 74%     Upper Body Dressing/Undressing Upper body dressing Upper body dressing/undressing activity did not occur (including orthotics): (activity did not occur; late admit) What is the patient wearing?: Pull over shirt    Upper body assist Assist Level: Maximal Assistance - Patient 25 - 49%    Lower Body Dressing/Undressing Lower body dressing    Lower body dressing activity did not occur: (activity did not occur; late admit) What is the patient wearing?: Underwear/pull up, Pants     Lower body assist Assist for lower body dressing: Maximal Assistance - Patient 25 - 49%     Toileting Toileting    Toileting assist Assist for toileting:  Moderate Assistance - Patient 50 - 74%     Transfers Chair/bed transfer  Transfers assist     Chair/bed transfer assist level: Minimal Assistance - Patient > 75%     Locomotion Ambulation   Ambulation assist      Assist level: Minimal Assistance - Patient > 75% Assistive device: Walker-rolling Max distance: 128ft   Walk 10 feet activity   Assist      Assist level: Minimal Assistance - Patient > 75% Assistive device: Hand held assist, Walker-rolling   Walk 50 feet activity   Assist    Assist level: Minimal Assistance - Patient > 75% Assistive device: Walker-rolling    Walk 150 feet activity   Assist Walk 150 feet activity did not occur: Safety/medical concerns         Walk 10 feet on uneven surface  activity   Assist Walk 10 feet on uneven surfaces activity did not occur: Safety/medical concerns         Wheelchair     Assist     Wheelchair activity did not occur: Safety/medical concerns         Wheelchair 50 feet with 2 turns activity    Assist    Wheelchair 50 feet with 2 turns activity did not occur: Safety/medical concerns       Wheelchair 150 feet activity     Assist Wheelchair 150 feet activity did not occur: Safety/medical concerns          Medical Problem List and Plan: 1.Left-sided weaknesssecondary to right MCA scattered infarct embolic. Status post partial revascularization of left ICA terminus as well as history of CVA -continue therapies   2. DVT Prophylaxis/Anticoagulation: Eliquis 3. Pain Management:Neurontin 400 mg daily, Fioricet for headaches as needed  -arthritis right wrist  -voltaren gel, ice  -maintain wrist splint to immobilize 4. Mood:BuSpar 15 mg twice daily, Zoloft 100 mg daily 5. Neuropsych: This patientiscapable of making decisions on herown behalf. 6. Skin/Wound Care:Routine skin checks 7. Fluids/Electrolytes/Nutrition:Routine in and outs with follow-up chemistries. Encourage PO 8.Hypertension. Lopressor 100 mg twice daily, Cozaar 100 mg daily, Norvasc 10 mg daily  -controlled 10/17 9.Diabetes mellitus with peripheral neuropathy. Hemoglobin A1c 5.7. Glucophage 1000 mg twice daily.   -reasonable control thus far 10/17 10.COPD with remote tobacco abuse. No shortness of breath reported 11.Hyperlipidemia.  Pravachol 12. Low grad temp: afebrile today  -OOB, IS   LOS: 2 days A FACE TO FACE EVALUATION WAS PERFORMED  Meredith Staggers 01/05/2018, 9:22 AM

## 2018-01-06 ENCOUNTER — Inpatient Hospital Stay (HOSPITAL_COMMUNITY): Payer: Medicare Other | Admitting: Physical Therapy

## 2018-01-06 ENCOUNTER — Inpatient Hospital Stay (HOSPITAL_COMMUNITY): Payer: Medicare Other | Admitting: Speech Pathology

## 2018-01-06 ENCOUNTER — Inpatient Hospital Stay (HOSPITAL_COMMUNITY): Payer: Medicare Other

## 2018-01-06 LAB — GLUCOSE, CAPILLARY
GLUCOSE-CAPILLARY: 138 mg/dL — AB (ref 70–99)
GLUCOSE-CAPILLARY: 112 mg/dL — AB (ref 70–99)
Glucose-Capillary: 95 mg/dL (ref 70–99)
Glucose-Capillary: 117 mg/dL — ABNORMAL HIGH (ref 70–99)
Glucose-Capillary: 111 mg/dL — ABNORMAL HIGH (ref 70–99)

## 2018-01-06 MED ORDER — CALCIUM CARBONATE ANTACID 500 MG PO CHEW
1.0000 | CHEWABLE_TABLET | ORAL | Status: DC | PRN
  Administered 2018-01-06: 200 mg via ORAL
  Filled 2018-01-06: qty 1

## 2018-01-06 NOTE — Progress Notes (Signed)
Speech Language Pathology Daily Session Note  Patient Details  Name: Renesme Kerrigan MRN: [id number] Date of Birth: 08-Feb-1941  Today's Date: 01/06/2018 SLP Individual Time: 0830-0930 SLP Individual Time Calculation (min): 60 min  Short Term Goals: Week 1: SLP Short Term Goal 1 (Week 1): STGs=LTGs due to short length of stay   Skilled Therapeutic Interventions: Skilled treatment session focused on cognitive goals. SLP facilitated session by providing extra time and Mod-Max A verbal cues for use of compensatory strategies for visual scanning to the left and for problem solving during a basic 4 step picture sequencing task. Patient requested to use the bathroom and was able to successfully void. However, patient required extra time and Max A verbal cues for problem solving and sequencing with transfer. Suspect patient's function impacted by pain and fatigue this session. Patient left upright in recliner with all needs within reach and daughter present. Continue with current plan of care.      Pain Pain Assessment Pain Scale: 0-10 Pain Score: 4  Pain Type: Acute pain Pain Location: Wrist Pain Orientation: Left Pain Descriptors / Indicators: Sharp;Throbbing Pain Onset: On-going Patients Stated Pain Goal: 0 Pain Intervention(s): Medication (See eMAR)  Therapy/Group: Individual Therapy  Yamir Carignan 01/06/2018, 11:44 AM

## 2018-01-06 NOTE — Progress Notes (Signed)
Physical Therapy Session Note  Patient Details  Name: Catherine Greene MRN: [id number] Date of Birth: 1941-02-20  Today's Date: 01/06/2018 PT Individual Time: 0950-1045 PT Individual Time Calculation (min): 55 min   Short Term Goals: Week 1:  PT Short Term Goal 1 (Week 1): Pt will perform bed mobility with min assist consistently  PT Short Term Goal 2 (Week 1): Pt will ambulate 19ft with min assist and LRAD  PT Short Term Goal 3 (Week 1): Pt will perform bed<>WC transfers with supervision assist LRAD  PT Short Term Goal 4 (Week 1): Pt will initate WC mobility  PT Short Term Goal 5 (Week 1): Pt will attend to L side 50% the time with min cues to improve safety of mobility tasks   Skilled Therapeutic Interventions/Progress Updates:    gait with RW 50' x 2 with min A to steer RW and to keep Lt UE on RW.  Gait 50' x 2 with SPC as this is what pt used at home, pt requires min HHA on Lt with SPC in Rt UE.  Stair negotiation x 4 stairs with bilat handrails with min A to ascend stairs, mod A to descend stairs with cuing for LT attention to keep Lt UE on railing, pt with difficulty with vision descending stairs requiring tactile cues for LE placement.  Standing dynavision with pt able to perform with 4.5 second reaction time. Standing tolerance limited due to fatigue. Pt left in bed with needs at hand, daughter present.  Therapy Documentation Precautions:  Precautions Precautions: Fall Precaution Comments: L hemi Restrictions Weight Bearing Restrictions: No Pain: Pt c/o pain in back with prolonged standing, eases with rest   Therapy/Group: Individual Therapy  Catherine Greene 01/06/2018, 10:51 AM

## 2018-01-06 NOTE — Progress Notes (Signed)
Occupational Therapy Session Note  Patient Details  Name: Catherine Greene MRN: [id number] Date of Birth: 1941/01/01  Today's Date: 01/06/2018 OT Individual Time: 1300-1414 OT Individual Time Calculation (min): 74 min    Short Term Goals: Week 1:  OT Short Term Goal 1 (Week 1): Pt will don pants EOB with min A OT Short Term Goal 2 (Week 1): Pt will complete toileting tasks with no change in vital signs OT Short Term Goal 3 (Week 1): Pt will transfer to toilet with min A  OT Short Term Goal 4 (Week 1): Pt will don shirt with min A  Skilled Therapeutic Interventions/Progress Updates:    Session focused on L NMR, functional activity tolerance,  and bimanual UE coordination. Pain reported in L wrist and back throughout session with rest breaks provided and alleviating pain. Pt completed grasp and release with green foam blocks, with min HOH and moderate vc for visual attention to hand and technique. Attempted to isolate pincer grasp, pt unable to move away from gross grasp yet. Pt was brought down to IADL apt where she completed standing level functional reaching with B UE. Min HOH provided for L UE grasp and shoulder flex. Pt required 2 extended rest breaks d/t SpO2 dropping to 84% on RA. With rest break pt able to increase O2 levels to 94%. Pt completed toilet transfer with min A and toileting tasks with mod A. Pt very lethargic this session, falling asleep frequently. Session was terminated 15 minutes early d/t pt fatigue. Pt left supine in bed with all needs met.   Therapy Documentation Precautions:  Precautions Precautions: Fall Precaution Comments: L hemi Restrictions Weight Bearing Restrictions: No Pain: Pain Assessment Pain Scale: 0-10 Pain Score: 7  Pain Type: Acute pain Pain Location: Back Pain Orientation: Lower Pain Descriptors / Indicators: Aching Pain Onset: On-going Pain Intervention(s): Ambulation/increased activity   Therapy/Group: Individual Therapy  Curtis Sites 01/06/2018, 2:17 PM

## 2018-01-06 NOTE — Progress Notes (Signed)
Pt still c/o left wrist pain, Ice had been applied, pt states it did not help, refuses heat, voltaren was given at noon, percocet given, call to xray to see when they are coming for wrist xray, states they will come soon.

## 2018-01-06 NOTE — IPOC Note (Signed)
Overall Plan of Care [facility]) Patient Details Name: Catherine Greene MRN: [id number] DOB: February 09, 1941  Admitting Diagnosis: <principal problem not specified>  Hospital Problems: Active Problems:   Right middle cerebral artery stroke [facility])     Functional Problem List: Nursing Bowel, Pain, Endurance, Medication Management, Safety  PT Balance, Behavior, Edema, Endurance, Motor, Perception, Safety, Sensory  OT Balance, Endurance, Vision, Perception, Motor, Cognition, Safety, Pain  SLP Cognition  TR         Basic ADL's: OT Eating, Grooming, Bathing, Toileting, Dressing     Advanced  ADL's: OT       Transfers: PT Bed Mobility, Bed to Chair, Car, [employer], Editor, commissioning, Agricultural engineer: PT Ambulation, Emergency planning/management officer, Stairs     Additional Impairments: OT Fuctional Use of Upper Extremity  SLP Social Cognition   Problem Solving, Memory  TR      Anticipated Outcomes Item Anticipated Outcome  Self Feeding set up  Swallowing      Basic self-care  (S)  Toileting  (S)   Bathroom Transfers (S)  Bowel/Bladder  manage bowel with mod I assist  Transfers  Supervision assist with LRAD   Locomotion  supevision assist with LRAD   Communication     Cognition  Supervision   Pain  Pain at or below level 4  Safety/Judgment  Maintain safety with cues/reminders   Therapy Plan: PT Intensity: Minimum of 1-2 x/day ,45 to 90 minutes PT Frequency: Total of 15 hours over 7 days of combined therapies PT Duration Estimated Length of Stay: 10-14 days OT Intensity: Minimum of 1-2 x/day, 45 to 90 minutes OT Frequency: 5 out of 7 days OT Duration/Estimated Length of Stay: 10-14 days SLP Intensity: Minumum of 1-2 x/day, 30 to 90 minutes SLP Frequency: 3 to 5 out of 7 days SLP Duration/Estimated Length of Stay: 7-10 days     Team Interventions: Nursing Interventions Patient/Family Education, Bowel Management, Pain Management, Disease Management/Prevention, Medication  Management, Discharge Planning  PT interventions Ambulation/gait training, Balance/vestibular training, Cognitive remediation/compensation, Community reintegration, Discharge planning, Disease management/prevention, Functional electrical stimulation, DME/adaptive equipment instruction, Functional mobility training, Neuromuscular re-education, Patient/family education, Pain management, Psychosocial support, Skin care/wound management, Stair training, Splinting/orthotics, UE/LE Strength taining/ROM, Therapeutic Exercise, Therapeutic Activities, Wheelchair propulsion/positioning, UE/LE Coordination activities, Visual/perceptual remediation/compensation  OT Interventions Training and development officer, Community reintegration, Disease mangement/prevention, Technical sales engineer stimulation, Neuromuscular re-education, Patient/family education, Self Care/advanced ADL retraining, Splinting/orthotics, Therapeutic Exercise, UE/LE Coordination activities, Wheelchair propulsion/positioning, Cognitive remediation/compensation, Discharge planning, DME/adaptive equipment instruction, Functional mobility training, Pain management, Psychosocial support, Skin care/wound managment, Therapeutic Activities, UE/LE Strength taining/ROM, Visual/perceptual remediation/compensation  SLP Interventions Cognitive remediation/compensation, Cueing hierarchy, Functional tasks, Patient/family education, Therapeutic Activities, Internal/external aids, Environmental controls  TR Interventions    SW/CM Interventions Discharge Planning, Psychosocial Support, Patient/Family Education   Barriers to Discharge MD  Medical stability  Nursing      PT Inaccessible home environment, Home environment access/layout    OT      SLP      SW       Team Discharge Planning: Destination: PT-  ,OT- Home , SLP-Home Projected Follow-up: PT-Home health PT, Skilled nursing facility, OT-  Home health OT, SLP-24 hour supervision/assistance, Home Health  SLP Projected Equipment Needs: PT-To be determined, OT- To be determined, SLP-None recommended by SLP Equipment Details: PT- , OT-  Patient/family involved in discharge planning: PT- Patient,  OT-Family member/caregiver, Patient, SLP-Patient  MD ELOS: 10-14 days Medical Rehab Prognosis:  Excellent Assessment: The patient has been admitted for  CIR therapies with the diagnosis of right mca infarct. The team will be addressing functional mobility, strength, stamina, balance, safety, adaptive techniques and equipment, self-care, bowel and bladder mgt, patient and caregiver education, NMR, pain mgt, visual-spatial awareness, community reentry. Goals have been set at supervision for self-care, mobility, cognition. Meredith Staggers, MD, FAAPMR      See Team Conference Notes for weekly updates to the plan of care

## 2018-01-06 NOTE — Progress Notes (Signed)
[facility] PHYSICAL MEDICINE & REHABILITATION PROGRESS NOTE   Subjective/Complaints: Still complaining of left wrist/hand soreness. voltaren gel helps somewhat as do pain medications. Didn't receive any ice to wrist  ROS: Patient denies fever, rash, sore throat, blurred vision, nausea, vomiting, diarrhea, cough, shortness of breath or chest pain,  headache, or mood change.    Objective:   No results found. Recent Labs    01/04/18 0547  WBC 11.0*  HGB 11.5*  HCT 35.3*  PLT 182   Recent Labs    01/04/18 0547  NA 136  K 4.0  CL 99  CO2 28  GLUCOSE 149*  BUN 23  CREATININE 0.97  CALCIUM 9.4    Intake/Output Summary (Last 24 hours) at 01/06/2018 1402 Last data filed at 01/06/2018 1244 Gross per 24 hour  Intake 562 ml  Output -  Net 562 ml     Physical Exam: Vital Signs Blood pressure (!) 128/46, pulse 72, temperature 97.8 F (36.6 C), temperature source Oral, resp. rate 18, height 5\' 2"  (1.575 m), weight 68 kg, SpO2 96 %. Constitutional: No distress . Vital signs reviewed. HEENT: EOMI, oral membranes moist Neck: supple Cardiovascular: RRR without murmur. No JVD    Respiratory: CTA Bilaterally without wheezes or rales. Normal effort    GI: BS +, non-tender, non-distended  Musc: left wrist remains tender around 1st/2nd [facility] Neurological: Alert. Flat affect. Fair insight and awareness. Normal language. Has a right gaze preference with some left-sided inattention--stable. Likely left HH as well. LUE 2-3/5 prox to distal. LLE 1-2/5 prox to distal--no motor changes. Decreased sensation distal LUE and LLE Skin: intact. Psych: pleasant    Assessment/Plan: 1. Functional deficits secondary to embolic right MCA infarct which require 3+ hours per day of interdisciplinary therapy in a comprehensive inpatient rehab setting.  Physiatrist is providing close team supervision and 24 hour management of active medical problems listed below.  Physiatrist and rehab team  continue to assess barriers to discharge/monitor patient progress toward functional and medical goals  Care Tool:  Bathing  Bathing activity did not occur: Refused(pt declined) Body parts bathed by patient: Right arm, Left arm, Chest, Abdomen, Front perineal area, Buttocks, Right upper leg, Left upper leg, Face   Body parts bathed by helper: Right lower leg, Left lower leg     Bathing assist Assist Level: Moderate Assistance - Patient 50 - 74%     Upper Body Dressing/Undressing Upper body dressing Upper body dressing/undressing activity did not occur (including orthotics): (activity did not occur; late admit) What is the patient wearing?: Pull over shirt    Upper body assist Assist Level: Maximal Assistance - Patient 25 - 49%    Lower Body Dressing/Undressing Lower body dressing    Lower body dressing activity did not occur: (activity did not occur; late admit) What is the patient wearing?: Underwear/pull up, Pants     Lower body assist Assist for lower body dressing: Contact Guard/Touching assist     Toileting Toileting    Toileting assist Assist for toileting: Moderate Assistance - Patient 50 - 74%     Transfers Chair/bed transfer  Transfers assist     Chair/bed transfer assist level: Minimal Assistance - Patient > 75%     Locomotion Ambulation   Ambulation assist      Assist level: Minimal Assistance - Patient > 75% Assistive device: Walker-rolling Max distance: 162ft   Walk 10 feet activity   Assist     Assist level: Minimal Assistance - Patient > 75% Assistive device:  Hand held assist, Walker-rolling   Walk 50 feet activity   Assist    Assist level: Minimal Assistance - Patient > 75% Assistive device: Walker-rolling    Walk 150 feet activity   Assist Walk 150 feet activity did not occur: Safety/medical concerns         Walk 10 feet on uneven surface  activity   Assist Walk 10 feet on uneven surfaces activity did not occur:  Safety/medical concerns         Wheelchair     Assist     Wheelchair activity did not occur: Safety/medical concerns         Wheelchair 50 feet with 2 turns activity    Assist    Wheelchair 50 feet with 2 turns activity did not occur: Safety/medical concerns       Wheelchair 150 feet activity     Assist Wheelchair 150 feet activity did not occur: Safety/medical concerns          Medical Problem List and Plan: 1.Left-sided weaknesssecondary to right MCA scattered infarct embolic. Status post partial revascularization of left ICA terminus as well as history of CVA -continue therapies   2. DVT Prophylaxis/Anticoagulation: Eliquis 3. Pain Management:Neurontin 400 mg daily, Fioricet for headaches as needed  -hand/wrist pain likely OA  -cotinue voltaren gel, ice  - wrist splint to immobilize  -check xray left hand today given persistent pain 4. Mood:BuSpar 15 mg twice daily, Zoloft 100 mg daily 5. Neuropsych: This patientiscapable of making decisions on herown behalf. 6. Skin/Wound Care:Routine skin checks 7. Fluids/Electrolytes/Nutrition:Routine in and outs with follow-up chemistries. Encourage PO 8.Hypertension. Lopressor 100 mg twice daily, Cozaar 100 mg daily, Norvasc 10 mg daily  -controlled 10/18 9.Diabetes mellitus with peripheral neuropathy. Hemoglobin A1c 5.7. Glucophage 1000 mg twice daily.   -reasonable control thus far 10/18 10.COPD with remote tobacco abuse. No shortness of breath reported 11.Hyperlipidemia. Pravachol 12. Low grad temp: resolved   LOS: 3 days A FACE TO FACE EVALUATION WAS PERFORMED  Meredith Staggers 01/06/2018, 2:02 PM

## 2018-01-06 NOTE — Plan of Care (Signed)
  Problem: RH BOWEL ELIMINATION Goal: RH STG MANAGE BOWEL WITH ASSISTANCE Description STG Manage Bowel with mod I Assistance.  Outcome: Progressing   Problem: RH SAFETY Goal: RH STG ADHERE TO SAFETY PRECAUTIONS W/ASSISTANCE/DEVICE Description STG Adhere to Safety Precautions With cues/ reminders Assistance/Device.  Outcome: Progressing   Problem: RH PAIN MANAGEMENT Goal: RH STG PAIN MANAGED AT OR BELOW PT'S PAIN GOAL Description At or below level 4  Outcome: Progressing   Problem: RH KNOWLEDGE DEFICIT Goal: RH STG INCREASE KNOWLEDGE OF DIABETES Description Manage DM with diet, medication and state understanding of need to check CBG routinely with cues/reminders  Outcome: Progressing Goal: RH STG INCREASE KNOWLEDGE OF HYPERTENSION Description  state understanding of how to Manage HTN with medication, and diet  using resources provided with min assist  Outcome: Progressing Goal: RH STG INCREASE KNOWLEGDE OF HYPERLIPIDEMIA Description Pt will be able to state and demo understanding of management of HLD using resources provided with min assist  Outcome: Progressing Goal: RH STG INCREASE KNOWLEDGE OF STROKE PROPHYLAXIS Description Pt will be able to demo understanding of prevention of secondary stroke using resources and min assist  Outcome: Progressing   Problem: Consults Goal: RH STROKE PATIENT EDUCATION Description See Patient Education module for education specifics  Outcome: Progressing   Problem: RH Vision Goal: RH LTG Vision (Specify) Outcome: Progressing

## 2018-01-07 ENCOUNTER — Inpatient Hospital Stay (HOSPITAL_COMMUNITY): Payer: Medicare Other | Admitting: Physical Therapy

## 2018-01-07 ENCOUNTER — Inpatient Hospital Stay (HOSPITAL_COMMUNITY): Payer: Medicare Other

## 2018-01-07 ENCOUNTER — Inpatient Hospital Stay (HOSPITAL_COMMUNITY): Payer: Medicare Other | Admitting: Speech Pathology

## 2018-01-07 DIAGNOSIS — E1151 Type 2 diabetes mellitus with diabetic peripheral angiopathy without gangrene: Secondary | ICD-10-CM

## 2018-01-07 DIAGNOSIS — J449 Chronic obstructive pulmonary disease, unspecified: Secondary | ICD-10-CM

## 2018-01-07 DIAGNOSIS — E1165 Type 2 diabetes mellitus with hyperglycemia: Secondary | ICD-10-CM

## 2018-01-07 LAB — GLUCOSE, CAPILLARY
GLUCOSE-CAPILLARY: 89 mg/dL (ref 70–99)
Glucose-Capillary: 123 mg/dL — ABNORMAL HIGH (ref 70–99)
Glucose-Capillary: 123 mg/dL — ABNORMAL HIGH (ref 70–99)
Glucose-Capillary: 136 mg/dL — ABNORMAL HIGH (ref 70–99)

## 2018-01-07 NOTE — Progress Notes (Signed)
Catherine Greene is a 77 y.o. female Jul 24, 1940 [id number]  Subjective: Working with therapy at my visit. Hand pain continues but no swelling or change - no other concerns  Objective: Vital signs in last 24 hours: Temp:  [98.1 F (36.7 C)-98.5 F (36.9 C)] 98.1 F (36.7 C) (10/19 0625) Pulse Rate:  [82-87] 82 (10/19 0625) Resp:  [16-18] 18 (10/19 0625) BP: (131-167)/(45-63) 167/63 (10/19 0625) SpO2:  [86 %-93 %] 93 % (10/19 0625) Weight change:  Last BM Date: 01/06/18  Intake/Output from previous day: 10/18 0701 - 10/19 0700 In: 560 [P.O.:560] Out: -   Physical Exam General: No apparent distress    Lungs: Normal effort. Lungs clear to auscultation, no crackles or wheezes. Cardiovascular: Regular rate and rhythm, no edema Neurological: No new neurological deficits   Lab Results: BMET    Component Value Date/Time   NA 136 01/04/2018 0547   K 4.0 01/04/2018 0547   CL 99 01/04/2018 0547   CO2 28 01/04/2018 0547   GLUCOSE 149 (H) 01/04/2018 0547   BUN 23 01/04/2018 0547   CREATININE 0.97 01/04/2018 0547   CALCIUM 9.4 01/04/2018 0547   GFRNONAA 54 (L) 01/04/2018 0547   GFRAA >60 01/04/2018 0547   CBC    Component Value Date/Time   WBC 11.0 (H) 01/04/2018 0547   RBC 3.98 01/04/2018 0547   HGB 11.5 (L) 01/04/2018 0547   HCT 35.3 (L) 01/04/2018 0547   PLT 182 01/04/2018 0547   MCV 88.7 01/04/2018 0547   MCH 28.9 01/04/2018 0547   MCHC 32.6 01/04/2018 0547   RDW 13.3 01/04/2018 0547   LYMPHSABS 1.4 01/04/2018 0547   MONOABS 1.4 (H) 01/04/2018 0547   EOSABS 0.5 01/04/2018 0547   BASOSABS 0.0 01/04/2018 0547   CBG's (last 3):   Recent Labs    01/06/18 2127 01/07/18 0704 01/07/18 1134  GLUCAP 112* 123* 123*   LFT's Lab Results  Component Value Date   ALT 15 01/04/2018   AST 16 01/04/2018   ALKPHOS 50 01/04/2018   BILITOT 0.7 01/04/2018    Studies/Results: Dg Wrist 2 Views Left  Result Date: 01/06/2018 CLINICAL DATA:  Pain after CVA. EXAM: LEFT WRIST  - 2 VIEW COMPARISON:  None. FINDINGS: Chondrocalcinosis. Scapholunate interval widening. Osteopenia. No acute fracture or dislocation. STT osteoarthritis. IMPRESSION: 1. 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 2. Chondrocalcinosis-question CPPD arthropathy given #1. Electronically Signed   By: Monte Fantasia M.D.   On: 01/06/2018 17:59    Medications:  I have reviewed the patient's current medications. Scheduled Medications: . amLODipine  10 mg Oral Daily  . apixaban  5 mg Oral BID  . aspirin EC  81 mg Oral Daily  . busPIRone  15 mg Oral BID  . diclofenac sodium  2 g Topical QID  . gabapentin  400 mg Oral Daily  . insulin aspart  0-9 Units Subcutaneous TID WC  . losartan  100 mg Oral Daily  . mouth rinse  15 mL Mouth Rinse BID  . metFORMIN  1,000 mg Oral BID WC  . metoprolol tartrate  100 mg Oral BID  . pravastatin  80 mg Oral Daily  . sertraline  100 mg Oral Daily   PRN Medications: acetaminophen, calcium carbonate, diphenoxylate-atropine, ondansetron (ZOFRAN) IV, oxyCODONE-acetaminophen, sorbitol  Assessment/Plan: Active Problems:   DM (diabetes mellitus), type 2, uncontrolled, periph vascular complic (HCC)   Essential hypertension   CAD (coronary artery disease), native coronary artery   Chronic obstructive pulmonary disease ([place])   Right middle  cerebral artery stroke (HCC)   1. R MCA CVA with L HP - continue med therapies and supportive CIR care as ongoing 2. DM2 uncontrol with periph neuro - continue med mgmt with SSI TID AC as ongoing 3. COPD 4. hypertension - reasonable BP control - continue same 5. Dyslipidemia - continue statin  Length of stay, days: 4   Jyoti Harju A. Asa Lente, MD 01/07/2018, 12:12 PM

## 2018-01-07 NOTE — Progress Notes (Signed)
Occupational Therapy Session Note  Patient Details  Name: Catherine Greene MRN: [id number] Date of Birth: 1941/02/21  Today's Date: 01/07/2018 OT Individual Time: 2694-8546 OT Individual Time Calculation (min): 75 min    Short Term Goals: Week 1:  OT Short Term Goal 1 (Week 1): Pt will don pants EOB with min A OT Short Term Goal 2 (Week 1): Pt will complete toileting tasks with no change in vital signs OT Short Term Goal 3 (Week 1): Pt will transfer to toilet with min A  OT Short Term Goal 4 (Week 1): Pt will don shirt with min A  Skilled Therapeutic Interventions/Progress Updates:    Session focused on b/d tasks at shower level, toileting tasks and functional activity tolerance. Pt completed functional mobility into bathroom with min A for RW management with cueing for Lt attention. Pt completed toileting tasks with mod A overall. Pt required mod A to doff shirt sitting, as well as pants. Min A required for UB and LB bathing sitting in shower. Pt's SpO2 was checked before and after shower, with >95% each time despite pt having SOB.  Pt sat EOB and required extended rest break following shower. Hemi dressing techniques explained to pt, with mod A required to thread B LE through pants and to pull up in standing. Pt sat EOB and completed grooming tasks with set up. Total A to don socks. Pt transferred to recliner and was left sitting up eating breakfast with family present.   Therapy Documentation  Precautions:  Precautions Precautions: Fall Precaution Comments: L hemi Restrictions Weight Bearing Restrictions: No Vital Signs: Therapy Vitals Temp: 98.1 F (36.7 C) Pulse Rate: 82 Resp: 18 BP: (!) 167/63 Patient Position (if appropriate): Lying Oxygen Therapy SpO2: 93 % O2 Device: Room Air Pain: Pain Assessment Pain Scale: 0-10 Pain Score: 5  Pain Type: Acute pain Pain Location: Back Pain Orientation: Lower Pain Onset: On-going Pain Intervention(s): Shower   Therapy/Group:  Individual Therapy  Curtis Sites 01/07/2018, 8:53 AM

## 2018-01-07 NOTE — Progress Notes (Signed)
Physical Therapy Session Note  Patient Details  Name: Catherine Greene MRN: [id number] Date of Birth: 1940/10/18  Today's Date: 01/07/2018 PT Individual Time: 4098-1191 PT Individual Time Calculation (min): 78 min   Short Term Goals: Week 1:  PT Short Term Goal 1 (Week 1): Pt will perform bed mobility with min assist consistently  PT Short Term Goal 2 (Week 1): Pt will ambulate 188f with min assist and LRAD  PT Short Term Goal 3 (Week 1): Pt will perform bed<>WC transfers with supervision assist LRAD  PT Short Term Goal 4 (Week 1): Pt will initate WC mobility  PT Short Term Goal 5 (Week 1): Pt will attend to L side 50% the time with min cues to improve safety of mobility tasks   Skilled Therapeutic Interventions/Progress Updates: Pt presented in bed with husband present agreeable to therapy. Pt requesting to use bathroom. Performed supine to sit with minA and use of bed features. Performed STS from slightly elevated bed minA with cues for hand placement and safety with RW. Ambulatory transfer to bathroom with CGA with verbal cues for L attention due as RW hit doorframe to bathroom. Pt required minA for LB clothing management on L side  (+void/BM). Required minA for STS from toilet level and performed ambulatory transfer to sink. Required verbal cues for attention to L while performing hand hygiene. Pt transported to rehab gym total A for energy conservation. PTA changed pt's w/c to smaller size with pt noted to have improved posture once w/c changed. Pt participated in standing balance activities including horseshoe toss performed with L and RUE. Blocked practice STS from w/c with pt requiring mod cues for shifting forward in w/c and tactile cues for proper hand placement. Performed toe taps to 4in step for forced use of LLE and wt shifting. Pt required intermittent breaks due to fatigue. Pt ambulated 126fto room with minA for RW management due to frequent drift to L which pt was able to correct with  verbal cues. Pt transported remaining distance to room and performed stand pivot to bed with minA for safety with RW in crowded space. Performed sit to supine with CGA and pt was able to scoot to middle of bed with verbal cues. Performed bridge with minA to [facility] left in bed at end of session with call bell within reach and current needs met.      Therapy Documentation Precautions:  Precautions Precautions: Fall Precaution Comments: L hemi Restrictions Weight Bearing Restrictions: No General:   Vital Signs: Therapy Vitals Temp: 98 F (36.7 C) Temp Source: Oral Pulse Rate: 75 Resp: 16 BP: (!) 141/48 Patient Position (if appropriate): Lying Oxygen Therapy SpO2: 95 % O2 Device: Room Air Pain: Pain Assessment Pain Scale: 0-10 Pain Score: 4  Pain Type: Chronic pain Pain Location: Back Pain Descriptors / Indicators: Aching Pain Frequency: Intermittent Pain Onset: On-going Pain Intervention(s): Medication (See eMAR)   Therapy/Group: Individual Therapy  Aaliah Jorgenson  Elchanan Bob, PTA  01/07/2018, 4:15 PM

## 2018-01-07 NOTE — Progress Notes (Signed)
Orthopedic Tech Progress Note Patient Details:  Catherine Greene 02/20/1941 [id number]  Ortho Devices Type of Ortho Device: Velcro wrist forearm splint Ortho Device/Splint Interventions: Application   Post Interventions Patient Tolerated: Well Instructions Provided: Care of device   Catherine Greene 01/07/2018, 6:24 PM

## 2018-01-07 NOTE — Progress Notes (Signed)
Speech Language Pathology Daily Session Note  Patient Details  Name: Catherine Greene MRN: [id number] Date of Birth: Nov 05, 1940  Today's Date: 01/07/2018 SLP Individual Time: 6283-1517 SLP Individual Time Calculation (min): 45 min  Short Term Goals: Week 1: SLP Short Term Goal 1 (Week 1): STGs=LTGs due to short length of stay   Skilled Therapeutic Interventions:  Pt was seen for skilled ST targeting cognitive goals.  Pt was eating breakfast upon therapist's arrival and was able to locate items on her meal tray that were to the left of midline with supervision cues after therapist minimized the amount of items on her table tray.  During a novel card game targeting visual scanning, pt needed up to mod assist to locate cards that were to the left of midline but could locate matches to the right of midline with supervision.  Pt requested to use the bedside commode prior to therapist leaving.  Pt was handed off to RN on commode to finish toileting.  Continue per current plan of care.    Pain Pain Assessment Pain Scale: 0-10 Pain Score: 0-No pain   Therapy/Group: Individual Therapy  Catherine Greene, Selinda Orion 01/07/2018, 12:11 PM

## 2018-01-08 ENCOUNTER — Inpatient Hospital Stay (HOSPITAL_COMMUNITY): Payer: Medicare Other | Admitting: Physical Therapy

## 2018-01-08 LAB — GLUCOSE, CAPILLARY
GLUCOSE-CAPILLARY: 131 mg/dL — AB (ref 70–99)
GLUCOSE-CAPILLARY: 112 mg/dL — AB (ref 70–99)
GLUCOSE-CAPILLARY: 105 mg/dL — AB (ref 70–99)
Glucose-Capillary: 106 mg/dL — ABNORMAL HIGH (ref 70–99)

## 2018-01-08 NOTE — Progress Notes (Signed)
Catherine Greene is a 77 y.o. female [birth year] [id number]  Subjective: Feels "worn out" from therapy sessions, but optimistic on potential for improvement - Family at [facility] - denies problems or pain  Objective: Vital signs in last 24 hours: Temp:  [97.7 F (36.5 C)-98.1 F (36.7 C)] 98.1 F (36.7 C) (10/20 0420) Pulse Rate:  [68-79] 68 (10/20 0420) Resp:  [16] 16 (10/20 0420) BP: (141-177)/(48-61) 177/61 (10/20 0420) SpO2:  [92 %-95 %] 92 % (10/20 0420) Weight change:  Last BM Date: 01/07/18  Intake/Output from previous day: 10/19 0701 - 10/20 0700 In: 480 [P.O.:480] Out: -   Physical Exam General: No apparent distress    Lungs: Normal effort. Lungs clear to auscultation, no crackles or wheezes. Cardiovascular: Regular rate and rhythm, no edema Neurological: No new neurological deficits   Lab Results: BMET    Component Value Date/Time   NA 136 01/04/2018 0547   K 4.0 01/04/2018 0547   CL 99 01/04/2018 0547   CO2 28 01/04/2018 0547   GLUCOSE 149 (H) 01/04/2018 0547   BUN 23 01/04/2018 0547   CREATININE 0.97 01/04/2018 0547   CALCIUM 9.4 01/04/2018 0547   GFRNONAA 54 (L) 01/04/2018 0547   GFRAA >60 01/04/2018 0547   CBC    Component Value Date/Time   WBC 11.0 (H) 01/04/2018 0547   RBC 3.98 01/04/2018 0547   HGB 11.5 (L) 01/04/2018 0547   HCT 35.3 (L) 01/04/2018 0547   PLT 182 01/04/2018 0547   MCV 88.7 01/04/2018 0547   MCH 28.9 01/04/2018 0547   MCHC 32.6 01/04/2018 0547   RDW 13.3 01/04/2018 0547   LYMPHSABS 1.4 01/04/2018 0547   MONOABS 1.4 (H) 01/04/2018 0547   EOSABS 0.5 01/04/2018 0547   BASOSABS 0.0 01/04/2018 0547   CBG's (last 3):   Recent Labs    01/07/18 1704 01/07/18 2125 01/08/18 0627  GLUCAP 89 136* 131*   LFT's Lab Results  Component Value Date   ALT 15 01/04/2018   AST 16 01/04/2018   ALKPHOS 50 01/04/2018   BILITOT 0.7 01/04/2018    Studies/Results: Dg Wrist 2 Views Left  Result Date: 01/06/2018 CLINICAL DATA:  Pain after  CVA. EXAM: LEFT WRIST - 2 VIEW COMPARISON:  None. FINDINGS: Chondrocalcinosis. Scapholunate interval widening. Osteopenia. No acute fracture or dislocation. STT osteoarthritis. IMPRESSION: 1. 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 2. Chondrocalcinosis-question CPPD arthropathy given #1. Electronically Signed   By: Monte Fantasia M.D.   On: 01/06/2018 17:59    Medications:  I have reviewed the patient's current medications. Scheduled Medications: . amLODipine  10 mg Oral Daily  . apixaban  5 mg Oral BID  . aspirin EC  81 mg Oral Daily  . busPIRone  15 mg Oral BID  . diclofenac sodium  2 g Topical QID  . gabapentin  400 mg Oral Daily  . insulin aspart  0-9 Units Subcutaneous TID WC  . losartan  100 mg Oral Daily  . mouth rinse  15 mL Mouth Rinse BID  . metFORMIN  1,000 mg Oral BID WC  . metoprolol tartrate  100 mg Oral BID  . pravastatin  80 mg Oral Daily  . sertraline  100 mg Oral Daily   PRN Medications: acetaminophen, calcium carbonate, diphenoxylate-atropine, ondansetron (ZOFRAN) IV, oxyCODONE-acetaminophen, sorbitol  Assessment/Plan: Principal Problem:   Right middle cerebral artery stroke (HCC) Active Problems:   DM (diabetes mellitus), type 2, uncontrolled, periph vascular complic (HCC)   Essential hypertension   CAD (coronary artery disease), native  coronary artery   Chronic obstructive pulmonary disease (HCC)   1. R MCA CVA with L HP - continue med therapies and supportive CIR care as ongoing 2. DM2 uncontrol with periph neuro - continue med mgmt with SSI TID AC as ongoing 3. COPD 4. hypertension - reasonable BP control - continue same 5. Dyslipidemia - continue statin  Length of stay, days: 5   Kamerin Grumbine A. Asa Lente, MD 01/08/2018, 11:12 AM

## 2018-01-08 NOTE — Progress Notes (Signed)
Physical Therapy Session Note  Patient Details  Name: Catherine Greene MRN: [id number] Date of Birth: 1940-07-26  Today's Date: 01/08/2018 PT Individual Time: 0728-0822 PT Individual Time Calculation (min): 54 min   Short Term Goals: Week 1:  PT Short Term Goal 1 (Week 1): Pt will perform bed mobility with min assist consistently  PT Short Term Goal 2 (Week 1): Pt will ambulate 139ft with min assist and LRAD  PT Short Term Goal 3 (Week 1): Pt will perform bed<>WC transfers with supervision assist LRAD  PT Short Term Goal 4 (Week 1): Pt will initate WC mobility  PT Short Term Goal 5 (Week 1): Pt will attend to L side 50% the time with min cues to improve safety of mobility tasks   Skilled Therapeutic Interventions/Progress Updates:    pt performs sit <> stand blocked practice 3 x 5 without UE support with contact guard assist, improved LE strength noted.  Gait with RW with mod cuing for Lt attention, supervision for balance.  Gait with obstacle negotiation and in home environment with mod cuing to avoid obstacles on left, supervision for balance.  Gait with SPC with close supervision, pt able to perform gait only 25' before fatigued with use of SPC, gait with RW up to 75' before fatigue.  Stair negotiation x 4 stairs with bilat handrails with min A, improved since previous attempt. Pt continues to require frequent rest breaks but able to perform with shorter rest breaks than in previous sessions.  Therapy Documentation Precautions:  Precautions Precautions: Fall Precaution Comments: L hemi Restrictions Weight Bearing Restrictions: No Pain: Pt c/o Lt shoulder pain, RN aware and pain meds given prior to session   Therapy/Group: Individual Therapy  Londen Lorge 01/08/2018, 8:24 AM

## 2018-01-08 NOTE — Plan of Care (Signed)
  Problem: RH BOWEL ELIMINATION Goal: RH STG MANAGE BOWEL WITH ASSISTANCE Description STG Manage Bowel with mod I Assistance.  Outcome: Progressing   Problem: RH SAFETY Goal: RH STG ADHERE TO SAFETY PRECAUTIONS W/ASSISTANCE/DEVICE Description STG Adhere to Safety Precautions With cues/ reminders Assistance/Device.  Outcome: Progressing   Problem: RH PAIN MANAGEMENT Goal: RH STG PAIN MANAGED AT OR BELOW PT'S PAIN GOAL Description At or below level 4  Outcome: Progressing   Problem: RH KNOWLEDGE DEFICIT Goal: RH STG INCREASE KNOWLEDGE OF DIABETES Description Manage DM with diet, medication and state understanding of need to check CBG routinely with cues/reminders  Outcome: Progressing Goal: RH STG INCREASE KNOWLEDGE OF HYPERTENSION Description  state understanding of how to Manage HTN with medication, and diet  using resources provided with min assist  Outcome: Progressing Goal: RH STG INCREASE KNOWLEGDE OF HYPERLIPIDEMIA Description Pt will be able to state and demo understanding of management of HLD using resources provided with min assist  Outcome: Progressing Goal: RH STG INCREASE KNOWLEDGE OF STROKE PROPHYLAXIS Description Pt will be able to demo understanding of prevention of secondary stroke using resources and min assist  Outcome: Progressing   Problem: Consults Goal: RH STROKE PATIENT EDUCATION Description See Patient Education module for education specifics  Outcome: Progressing   Problem: RH Vision Goal: RH LTG Vision (Specify) Outcome: Progressing

## 2018-01-09 ENCOUNTER — Inpatient Hospital Stay (HOSPITAL_COMMUNITY): Payer: Medicare Other

## 2018-01-09 ENCOUNTER — Inpatient Hospital Stay (HOSPITAL_COMMUNITY): Payer: Medicare Other | Admitting: Physical Therapy

## 2018-01-09 ENCOUNTER — Inpatient Hospital Stay (HOSPITAL_COMMUNITY): Payer: Medicare Other | Admitting: Speech Pathology

## 2018-01-09 LAB — GLUCOSE, CAPILLARY
GLUCOSE-CAPILLARY: 115 mg/dL — AB (ref 70–99)
GLUCOSE-CAPILLARY: 121 mg/dL — AB (ref 70–99)
Glucose-Capillary: 109 mg/dL — ABNORMAL HIGH (ref 70–99)
Glucose-Capillary: 113 mg/dL — ABNORMAL HIGH (ref 70–99)

## 2018-01-09 NOTE — Progress Notes (Signed)
Physical Therapy Session Note  Patient Details  Name: Catherine Greene MRN: [id number] Date of Birth: 02-04-1941  Today's Date: 01/09/2018 PT Individual Time: 0828-0923 PT Individual Time Calculation (min): 55 min   Short Term Goals: Week 1:  PT Short Term Goal 1 (Week 1): Pt will perform bed mobility with min assist consistently  PT Short Term Goal 2 (Week 1): Pt will ambulate 113ft with min assist and LRAD  PT Short Term Goal 3 (Week 1): Pt will perform bed<>WC transfers with supervision assist LRAD  PT Short Term Goal 4 (Week 1): Pt will initate WC mobility  PT Short Term Goal 5 (Week 1): Pt will attend to L side 50% the time with min cues to improve safety of mobility tasks   Skilled Therapeutic Interventions/Progress Updates:   pt c/o upset stomach but willing to participate in session. Pt dons shirt with mod/max A, increased time and cues.  pt performs stand pivot transfers and sit <> stand throughout session with min A, cues for UE placement.  Gait in controlled environment 100' x 2 with supervision with RW.  Gait with obstacle negotiation with mod cues for Lt attention, supervision for balance.  Seated Lt UE task with peg board with hand over hand assist to grasp and place pegs. Pt then requests to use bathroom. Toilet transfers with supervision with use of grab bar, mod A for clothing management and hygiene. Pt left in bed with needs at hand.  Therapy Documentation Precautions:  Precautions Precautions: Fall Precaution Comments: L hemi Restrictions Weight Bearing Restrictions: No Pain:  pt c/o pain in Lt shoulder during session, RN made aware, rests given as needed   Therapy/Group: Individual Therapy  Linh Hedberg 01/09/2018, 9:24 AM

## 2018-01-09 NOTE — Progress Notes (Signed)
Occupational Therapy Session Note  Patient Details  Name: Catherine Greene MRN: [id number] Date of Birth: Nov 04, 1940  Today's Date: 01/09/2018 OT Individual Time: 1050-1200 Session 2: 1430-1500 OT Individual Time Calculation (min): 70 min  Session 2: 30 min   Short Term Goals: Week 1:  OT Short Term Goal 1 (Week 1): Pt will don pants EOB with min A OT Short Term Goal 2 (Week 1): Pt will complete toileting tasks with no change in vital signs OT Short Term Goal 3 (Week 1): Pt will transfer to toilet with min A  OT Short Term Goal 4 (Week 1): Pt will don shirt with min A  Skilled Therapeutic Interventions/Progress Updates:    Session 1: Session focused on BUE coordination/FMC and toileting tasks. Pt completed functional mobility to bathroom with RW with CGA. Moderate cueing provided for RW management. Pt completed clothing management with moderate A and L HOH to hook thumb into pants while pulling up. Pt was brought down to therapy gym and transferred to mat with CGA and cueing for RW management. Pt completed functional reaching with posterior reaching to forward reaching with min A to simulate clothing management.   Session 2: Session focused on functional mobility and dynamic standing balance. Pt completed 125 ft of functional mobility with RW, 3 standing level rest breaks ,with CGA provided throughout. Occasional vc for Lt attn. Pt completed standing level throwing task, reaching laterally for horse shoes and throwing forward. Pt had no LOB and demonstrated good safety awareness throughout. No change in vitals throughout or reports of pain. Pt returned to room and was left supine in bed with all needs met and bed alarm set.   Therapy Documentation Precautions:  Precautions Precautions: Fall Precaution Comments: L hemi Restrictions Weight Bearing Restrictions: No    Vital Signs: Therapy Vitals Pulse Rate: 78 BP: 140/63 Pain: Pain Assessment Pain Scale: 0-10 Pain Score: 0-No pain Pain  Type: Chronic pain Pain Location: Arm Pain Descriptors / Indicators: Aching Pain Frequency: Occasional Pain Onset: Gradual Patients Stated Pain Goal: 2 Pain Intervention(s): Medication (See eMAR)(percocet 1 po)   Therapy/Group: Individual Therapy  Curtis Sites 01/09/2018, 12:20 PM

## 2018-01-09 NOTE — Progress Notes (Signed)
Speech Language Pathology Daily Session Note  Patient Details  Name: Vibha Ferdig MRN: [id number] Date of Birth: [birth year]  Today's Date: 01/09/2018 SLP Individual Time: 1000-1045 SLP Individual Time Calculation (min): 45 min  Short Term Goals: Week 1: SLP Short Term Goal 1 (Week 1): STGs=LTGs due to short length of stay   Skilled Therapeutic Interventions: Skilled treatment session focused on cognition. SLP received pt in bed and pt describes not feeling well d/t nausea. States that she had asked nurse for medication but hadn't received any medicine yet. SLP checked with nursing and pt is correct. SLP offered ginger ale and crackers, placed on pt's left and pt required Min A cues to locate possible d/t nurse was initially on pt's right. Pt able to recall all medication that she was due to receive when nurse was present. With supervision cues, pt able to complete menu selection for lunch. Pt left with nursing in bed, bed alarm on and all needs within reach.         Pain Pain Assessment Pain Scale: 0-10 Pain Score: 0-No pain Pain Type: Chronic pain Pain Location: Arm Pain Descriptors / Indicators: Aching Pain Frequency: Occasional Pain Onset: Gradual Patients Stated Pain Goal: 2 Pain Intervention(s): Medication (See eMAR)(percocet 1 po)  Therapy/Group: Individual Therapy  Azan Maneri 01/09/2018, 1:15 PM

## 2018-01-09 NOTE — Progress Notes (Signed)
[facility] PHYSICAL MEDICINE & REHABILITATION PROGRESS NOTE   Subjective/Complaints: Fairly uneventful weekend. Happy to have a bit of a break yesterday  ROS: Patient denies fever, rash, sore throat, blurred vision, nausea, vomiting, diarrhea, cough, shortness of breath or chest pain, joint or back pain, headache, or mood change. .    Objective:   No results found. No results for input(s): WBC, HGB, HCT, PLT in the last 72 hours. No results for input(s): NA, K, CL, CO2, GLUCOSE, BUN, CREATININE, CALCIUM in the last 72 hours.  Intake/Output Summary (Last 24 hours) at 01/09/2018 0859 Last data filed at 01/08/2018 1758 Gross per 24 hour  Intake 440 ml  Output -  Net 440 ml     Physical Exam: Vital Signs Blood pressure (!) 165/56, pulse 64, temperature 97.6 F (36.4 C), temperature source Oral, resp. rate 16, height 5\' 2"  (1.575 m), weight 68 kg, SpO2 91 %. Constitutional: No distress . Vital signs reviewed. HEENT: EOMI, oral membranes moist Neck: supple Cardiovascular: RRR without murmur. No JVD    Respiratory: CTA Bilaterally without wheezes or rales. Normal effort    GI: BS +, non-tender, non-distended  Musc: left tender around 1st/2nd [facility] Neurological: Alert, mild left inattention. Likely left HH as well. LUE 2-3/5 prox to distal. LLE 1-2/5 prox to distal--no motor changes. Decreased sensation distal LUE and LLE Skin: intact. Psych: pleasant    Assessment/Plan: 1. Functional deficits secondary to embolic right MCA infarct which require 3+ hours per day of interdisciplinary therapy in a comprehensive inpatient rehab setting.  Physiatrist is providing close team supervision and 24 hour management of active medical problems listed below.  Physiatrist and rehab team continue to assess barriers to discharge/monitor patient progress toward functional and medical goals  Care Tool:  Bathing  Bathing activity did not occur: Refused(pt declined) Body parts bathed by  patient: Right arm, Left arm, Chest, Abdomen, Front perineal area, Right upper leg, Left upper leg, Face   Body parts bathed by helper: Right lower leg, Left lower leg, Buttocks     Bathing assist Assist Level: Moderate Assistance - Patient 50 - 74%     Upper Body Dressing/Undressing Upper body dressing Upper body dressing/undressing activity did not occur (including orthotics): (activity did not occur; late admit) What is the patient wearing?: Pull over shirt    Upper body assist Assist Level: Moderate Assistance - Patient 50 - 74%    Lower Body Dressing/Undressing Lower body dressing    Lower body dressing activity did not occur: (activity did not occur; late admit) What is the patient wearing?: Underwear/pull up, Pants     Lower body assist Assist for lower body dressing: Moderate Assistance - Patient 50 - 74%     Toileting Toileting    Toileting assist Assist for toileting: Moderate Assistance - Patient 50 - 74%     Transfers Chair/bed transfer  Transfers assist     Chair/bed transfer assist level: Minimal Assistance - Patient > 75%     Locomotion Ambulation   Ambulation assist      Assist level: Minimal Assistance - Patient > 75% Assistive device: Walker-rolling Max distance: 163ft   Walk 10 feet activity   Assist     Assist level: Minimal Assistance - Patient > 75% Assistive device: Walker-rolling   Walk 50 feet activity   Assist    Assist level: Minimal Assistance - Patient > 75% Assistive device: Walker-rolling    Walk 150 feet activity   Assist Walk 150 feet activity did not  occur: Safety/medical concerns         Walk 10 feet on uneven surface  activity   Assist Walk 10 feet on uneven surfaces activity did not occur: Safety/medical Armed forces technical officer activity did not occur: Safety/medical concerns         Wheelchair 50 feet with 2 turns activity    Assist     Wheelchair 50 feet with 2 turns activity did not occur: Safety/medical concerns       Wheelchair 150 feet activity     Assist Wheelchair 150 feet activity did not occur: Safety/medical concerns          Medical Problem List and Plan: 1.Left-sided weaknesssecondary to right MCA scattered infarct embolic. Status post partial revascularization of left ICA terminus as well as history of CVA -continue therapies   2. DVT Prophylaxis/Anticoagulation: Eliquis 3. 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 Mood:BuSpar 15 mg twice daily, Zoloft 100 mg daily 5. Neuropsych: This patientiscapable of making decisions on herown behalf. 6. Skin/Wound Care:Routine skin checks 7. Fluids/Electrolytes/Nutrition:Routine in and outs with follow-up chemistries. Encourage PO 8.Hypertension. Lopressor 100 mg twice daily, Cozaar 100 mg daily, Norvasc 10 mg daily  -controlled 10/18 9.Diabetes mellitus with peripheral neuropathy. Hemoglobin A1c 5.7. Glucophage 1000 mg twice daily.   -reasonable control 10/21 10.COPD with remote tobacco abuse. No shortness of breath reported 11.Hyperlipidemia. Pravachol     LOS: 6 days A FACE TO FACE EVALUATION WAS PERFORMED  Meredith Staggers 01/09/2018, 8:59 AM

## 2018-01-10 ENCOUNTER — Inpatient Hospital Stay (HOSPITAL_COMMUNITY): Payer: Medicare Other | Admitting: Occupational Therapy

## 2018-01-10 ENCOUNTER — Inpatient Hospital Stay (HOSPITAL_COMMUNITY): Payer: Medicare Other

## 2018-01-10 ENCOUNTER — Inpatient Hospital Stay (HOSPITAL_COMMUNITY): Payer: Medicare Other | Admitting: Physical Therapy

## 2018-01-10 LAB — GLUCOSE, CAPILLARY
GLUCOSE-CAPILLARY: 164 mg/dL — AB (ref 70–99)
Glucose-Capillary: 88 mg/dL (ref 70–99)
Glucose-Capillary: 98 mg/dL (ref 70–99)
Glucose-Capillary: 108 mg/dL — ABNORMAL HIGH (ref 70–99)

## 2018-01-10 NOTE — Progress Notes (Signed)
Physical Therapy Session Note  Patient Details  Name: Catherine Greene MRN: [id number] Date of Birth: [birth year]  Today's Date: 01/10/2018 PT Individual Time: 8638-1771 PT Individual Time Calculation (min): 30 min   Short Term Goals: Week 1:  PT Short Term Goal 1 (Week 1): Pt will perform bed mobility with min assist consistently  PT Short Term Goal 2 (Week 1): Pt will ambulate 175ft with min assist and LRAD  PT Short Term Goal 3 (Week 1): Pt will perform bed<>WC transfers with supervision assist LRAD  PT Short Term Goal 4 (Week 1): Pt will initate WC mobility  PT Short Term Goal 5 (Week 1): Pt will attend to L side 50% the time with min cues to improve safety of mobility tasks   Skilled Therapeutic Interventions/Progress Updates:    Pt supine in bed upon PT arrival, agreeable to therapy tx and denies pain. Pt transferred to sitting EOB with supervision and transferred sit<>stand with min assist and RW. Pt ambulated from bed>bathroom x 10 ft with RW and min assist. Pt performed clothing management and pericare with stand by assist for balance. Pt ambulated to w/c with RW and min assist. Pt transported to dayroom and performed stand pivot to nustep. Pt used nustep x 5 minutes on workload 5 for strength and endurance. Pt transported back to room and left seated in w/c with needs in reach.   Therapy Documentation Precautions:  Precautions Precautions: Fall Precaution Comments: L hemi Restrictions Weight Bearing Restrictions: No    Therapy/Group: Individual Therapy  Netta Corrigan, PT, DPT 01/10/2018, 7:50 AM

## 2018-01-10 NOTE — Progress Notes (Signed)
Occupational Therapy Session Note  Patient Details  Name: Catherine Greene MRN: [id number] Date of Birth: 1941/03/05  Today's Date: 01/10/2018 OT Individual Time: 0945-1050 OT Individual Time Calculation (min): 65 min    Short Term Goals: Week 1:  OT Short Term Goal 1 (Week 1): Pt will don pants EOB with min A OT Short Term Goal 2 (Week 1): Pt will complete toileting tasks with no change in vital signs OT Short Term Goal 3 (Week 1): Pt will transfer to toilet with min A  OT Short Term Goal 4 (Week 1): Pt will don shirt with min A  Skilled Therapeutic Interventions/Progress Updates:    Patient seated in w/c upon start of session with c/o mild pain left shoulder.  She completed washing at the sink in stance with CG.  Standing tolerance 10 minutes.  Increased effort to reach for items with left hand but able to turn on water and rinse cloth.  UB dressing with min A and mod cues to attend to left side.  LB dressing completed with min A, cues for left and proper positioning of clothing.  CM min A, hygiene CG in stance with RW.  Toilet transfer CG/min A with RW.  Significant cues when returning to w/c on left side due to walker wheels getting caught on the w/c.   Short distance ambulation with RW CG.  Seated trunk/posture and proximal shoulder mobility activities completed with good tolerance - returned to w/c due to lower back fatigue.  Cues for UE positioning, vertical support placed in w/c and education provided to improve mobility and attempt to decrease pain.  Completed left UE scapular mobility and reach exercise in stance with good results, less discomfort and improved posture.  Returned to room with ice pack to left shoulder.  Next therapy scheduled in 15 minutes.    Therapy Documentation Precautions:  Precautions Precautions: Fall Precaution Comments: L hemi Restrictions Weight Bearing Restrictions: No General:     Other Treatments:     Therapy/Group: Individual Therapy  Carlos Levering 01/10/2018, 12:12 PM

## 2018-01-10 NOTE — Progress Notes (Signed)
Physical Therapy Session Note  Patient Details  Name: Catherine Greene MRN: [id number] Date of Birth: 05-23-1940  Today's Date: 01/10/2018 PT Individual Time: 1330-1426 PT Individual Time Calculation (min): 56 min   Short Term Goals: Week 2:  PT Short Term Goal 1 (Week 2): = LTG  Skilled Therapeutic Interventions/Progress Updates:   Pt received supine in bed and agreeable to therapy. Pt transferred from supine to EOB sitting w/ supervision and additional time. Sit<>transfers throughout session began as supervision but after multiple transfers regressed to min A due to fatigue and decreased muscular endurance. Pt required multiple rest breaks throughout session due to fatigue. Pt amb w/ RW 1102ft and required min VC to walk in a straight pattern and avoid running into things on the left. Pt performed BERG balance assessment as noted below. Pt received a 28 and was given education on fall risk. Pt completed pipe tree exercises x2. Initial pipe tree activity pt performed cross w/ setup A, pt then progressed to field goal and required min A and verbal cues and encouragement for use of L UE and difficulty problem solving. Pt returned to room in supine with ice pack applied to L shoulder, husband present, and all needs in reach,    Therapy Documentation Precautions:  Precautions Precautions: Fall Precaution Comments: L hemi Restrictions Weight Bearing Restrictions: No   Pain: Pt reported sharp pain in L shoulder throughout session. Pt given rest, repositioning, and ice.     Balance: Standardized Balance Assessment Standardized Balance Assessment: Berg Balance Test Berg Balance Test Sit to Stand: Able to stand  independently using hands Standing Unsupported: Able to stand 2 minutes with supervision Sitting with Back Unsupported but Feet Supported on Floor or Stool: Able to sit safely and securely 2 minutes Stand to Sit: Uses backs of legs against chair to control descent Transfers: Able to  transfer with verbal cueing and /or supervision Standing Unsupported with Eyes Closed: Able to stand 10 seconds with supervision Standing Ubsupported with Feet Together: Able to place feet together independently and stand for 1 minute with supervision From Standing, Reach Forward with Outstretched Arm: Reaches forward but needs supervision From Standing Position, Pick up Object from Floor: Unable to pick up shoe, but reaches 2-5 cm (1-2") from shoe and balances independently From Standing Position, Turn to Look Behind Over each Shoulder: Turn sideways only but maintains balance Turn 360 Degrees: Needs close supervision or verbal cueing Standing Unsupported, Alternately Place Feet on Step/Stool: Able to complete >2 steps/needs minimal assist Standing Unsupported, One Foot in Front: Needs help to step but can hold 15 seconds Standing on One Leg: Unable to try or needs assist to prevent fall Total Score: 28     Therapy/Group: Individual Therapy  Ashritha Desrosiers SPT 01/10/2018, 2:28 PM

## 2018-01-10 NOTE — Progress Notes (Signed)
Physical Therapy Weekly Progress Note  Patient Details  Name: Catherine Greene MRN: [id number] Date of Birth: [birth year]  Beginning of progress report period: January 04, 2018 End of progress report period: January 10, 2018   Patient has met 2 of 5 short term goals.  Pt is progressing well with activiyt tolerance and gait, continues to require assist for standing when fatigued or in pain, continues to require mod cues for LT attention.  Patient continues to demonstrate the following deficits muscle weakness, motor apraxia, decreased coordination and decreased motor planning, decreased attention, decreased awareness and decreased problem solving and decreased standing balance, hemiplegia and decreased balance strategies and therefore will continue to benefit from skilled PT intervention to increase functional independence with mobility.  Patient progressing toward long term goals..  Continue plan of care.  PT Short Term Goals Week 1:  PT Short Term Goal 1 (Week 1): Pt will perform bed mobility with min assist consistently  PT Short Term Goal 1 - Progress (Week 1): Met PT Short Term Goal 2 (Week 1): Pt will ambulate 156f with min assist and LRAD  PT Short Term Goal 2 - Progress (Week 1): Progressing toward goal PT Short Term Goal 3 (Week 1): Pt will perform bed<>WC transfers with supervision assist LRAD  PT Short Term Goal 3 - Progress (Week 1): Progressing toward goal PT Short Term Goal 4 (Week 1): Pt will initate WC mobility  PT Short Term Goal 4 - Progress (Week 1): Discontinued (comment) PT Short Term Goal 5 (Week 1): Pt will attend to L side 50% the time with min cues to improve safety of mobility tasks  PT Short Term Goal 5 - Progress (Week 1): Met Week 2:  PT Short Term Goal 1 (Week 2): = LTG  Skilled Therapeutic Interventions/Progress Updates:  Ambulation/gait training;Balance/vestibular training;Cognitive remediation/compensation;Community reintegration;Discharge planning;Disease  management/prevention;Functional electrical stimulation;DME/adaptive equipment instruction;Functional mobility training;Neuromuscular re-education;Patient/family education;Pain management;Psychosocial support;Skin care/wound management;Stair training;Splinting/orthotics;UE/LE Strength taining/ROM;Therapeutic Exercise;Therapeutic Activities;Wheelchair propulsion/positioning;UE/LE Coordination activities;Visual/perceptual remediation/compensation     Crystina Borrayo 01/10/2018, 8:19 AM

## 2018-01-10 NOTE — Progress Notes (Addendum)
Physical Therapy Session Note  Patient Details  Name: Catherine Greene MRN: [id number] Date of Birth: 09-Jun-1940  Today's Date: 01/10/2018 PT Individual Time: 1103-1157 PT Individual Time Calculation (min): 54 min   Skilled Therapeutic Interventions/Progress Updates:  Pt received in w/c & agreeable to tx. Pt with c/o 4/10 pain in L shoulder but has ice applied & reports receiving pain meds this morning but also stating they haven't help today. Therapist provided total assist for donning L wrist brace & transported pt to gym via w/c total assist for time management. Pt negotiates 4 steps + 4 steps with B rails and min assist with cuing for compensatory pattern to decrease c/o R hip pain when descending stairs. Pt ambulates 75 ft + 25 ft + 25 ft with RW & CGA<>min assist with assistance for steering RW. Pt with impaired ability to grip stair rail and RW with LUE with pt reporting increased difficulty with brace donned & brace removed halfway through session upon pt request. Pt completed car transfer at sedan simulated height with min assist with cuing to sit on seat then transfer BLE in/out of car, as well as cuing/assist to maintain BLE inside of RW when turning to square up to seat. Pt completes all sit<>stand transfers from low w/c with min assist demonstrating difficulty with task, therefore pt completed 2 sets of 5x sit<>stand transfers from elevated mat without BUE support and close supervision with task focusing on BLE strengthening with therapist educating pt to scoot out to EOM to prevent pt from bracing herself posteriorly with BLE. Pt completed bed mobility in apartment with min assist for RLE for sit>supine but supervision for supine>sit; pt reports she sleeps on the couch at home and has done so for past 3-4 years because it's more comfortable. At end of session pt left sitting in w/c in room with ice reapplied to L shoulder & in care of NT. Notified RN of pt's ongoing c/o 4/10 pain in L shoulder.    Addendum: Pt requires cuing to attend to L hand placement on RW throughout session.   Therapy Documentation Precautions:  Precautions Precautions: Fall Precaution Comments: L hemi Restrictions Weight Bearing Restrictions: No     Therapy/Group: Individual Therapy  Waunita Schooner 01/10/2018, 12:02 PM

## 2018-01-10 NOTE — Progress Notes (Signed)
[place] PHYSICAL MEDICINE & REHABILITATION PROGRESS NOTE   Subjective/Complaints: Feeling fairly well. Happy that she's making progress. Did stairs yesterday which were difficult due to dpth perception. Left wrist splint helps  ROS: Patient denies fever, rash, sore throat, blurred vision, nausea, vomiting, diarrhea, cough, shortness of breath or chest pain,, headache, or mood change.   Objective:   No results found. No results for input(s): WBC, HGB, HCT, PLT in the last 72 hours. No results for input(s): NA, K, CL, CO2, GLUCOSE, BUN, CREATININE, CALCIUM in the last 72 hours.  Intake/Output Summary (Last 24 hours) at 01/10/2018 0902 Last data filed at 01/10/2018 0700 Gross per 24 hour  Intake 400 ml  Output -  Net 400 ml     Physical Exam: Vital Signs Blood pressure (!) 170/64, pulse 68, temperature 97.8 F (36.6 C), temperature source Oral, resp. rate 15, height 5\' 2"  (1.575 m), weight 68 kg, SpO2 95 %. Constitutional: No distress . Vital signs reviewed. HEENT: EOMI, oral membranes moist Neck: supple Cardiovascular: RRR without murmur. No JVD    Respiratory: CTA Bilaterally without wheezes or rales. Normal effort    GI: BS +, non-tender, non-distended   Musc: left tender around 1st/2nd CMC, decreased swelling Neurological: Alert, mild left inattention. Likely left HH as well. LUE 2-3/5 prox to distal. LLE 2/5 prox to distal--stable. Decreased sensation distal LUE and LLE Skin: intact. Psych: pleasant    Assessment/Plan: 1. Functional deficits secondary to embolic right MCA infarct which require 3+ hours per day of interdisciplinary therapy in a comprehensive inpatient rehab setting.  Physiatrist is providing close team supervision and 24 hour management of active medical problems listed below.  Physiatrist and rehab team continue to assess barriers to discharge/monitor patient progress toward functional and medical goals  Care Tool:  Bathing  Bathing activity  did not occur: Refused(pt declined) Body parts bathed by patient: Right arm, Left arm, Chest, Abdomen, Front perineal area, Right upper leg, Left upper leg, Face   Body parts bathed by helper: Right lower leg, Left lower leg, Buttocks     Bathing assist Assist Level: Moderate Assistance - Patient 50 - 74%     Upper Body Dressing/Undressing Upper body dressing Upper body dressing/undressing activity did not occur (including orthotics): (activity did not occur; late admit) What is the patient wearing?: Pull over shirt    Upper body assist Assist Level: Moderate Assistance - Patient 50 - 74%    Lower Body Dressing/Undressing Lower body dressing    Lower body dressing activity did not occur: (activity did not occur; late admit) What is the patient wearing?: Underwear/pull up, Pants     Lower body assist Assist for lower body dressing: Moderate Assistance - Patient 50 - 74%     Toileting Toileting    Toileting assist Assist for toileting: Moderate Assistance - Patient 50 - 74%     Transfers Chair/bed transfer  Transfers assist     Chair/bed transfer assist level: Contact Guard/Touching assist     Locomotion Ambulation   Ambulation assist      Assist level: Supervision/Verbal cueing Assistive device: Walker-rolling Max distance: 155ft   Walk 10 feet activity   Assist     Assist level: Supervision/Verbal cueing Assistive device: Walker-rolling   Walk 50 feet activity   Assist    Assist level: Supervision/Verbal cueing Assistive device: Walker-rolling    Walk 150 feet activity   Assist Walk 150 feet activity did not occur: Safety/medical concerns  Walk 10 feet on uneven surface  activity   Assist Walk 10 feet on uneven surfaces activity did not occur: Safety/medical concerns         Wheelchair     Assist Will patient use wheelchair at discharge?: No   Wheelchair activity did not occur: Safety/medical concerns          Wheelchair 50 feet with 2 turns activity    Assist    Wheelchair 50 feet with 2 turns activity did not occur: Safety/medical concerns       Wheelchair 150 feet activity     Assist Wheelchair 150 feet activity did not occur: Safety/medical concerns          Medical Problem List and Plan: 1.Left-sided weaknesssecondary to right MCA scattered infarct embolic. Status post partial revascularization of left ICA terminus as well as history of CVA -Interdisciplinary Team Conference today     2. DVT Prophylaxis/Anticoagulation: Eliquis 3. Pain Management:Neurontin 400 mg daily, Fioricet for headaches as needed  -hand/wrist pain likely OA  -cotinue voltaren gel, ice  - wrist splint to immobilize during the day and prn  -wrist xr with scapholunate widening, OA, mild chondrocalcinosis 4. Mood:BuSpar 15 mg twice daily, Zoloft 100 mg daily 5. Neuropsych: This patientiscapable of making decisions on herown behalf. 6. Skin/Wound Care:Routine skin checks 7. Fluids/Electrolytes/Nutrition:Routine in and outs with follow-up chemistries. Encourage PO 8.Hypertension. Lopressor 100 mg twice daily, Cozaar 100 mg daily, Norvasc 10 mg daily  -fairly controlled 10/22 9.Diabetes mellitus with peripheral neuropathy. Hemoglobin A1c 5.7. Glucophage 1000 mg twice daily.   -reasonable control 10/22 10.COPD with remote tobacco abuse. No shortness of breath reported 11.Hyperlipidemia. Pravachol     LOS: 7 days A FACE TO FACE EVALUATION WAS PERFORMED  Meredith Staggers 01/10/2018, 9:02 AM

## 2018-01-10 NOTE — Progress Notes (Signed)
Speech Language Pathology Daily Session Note  Patient Details  Name: Catherine Greene MRN: [id number] Date of Birth: [birth year]  Today's Date: 01/10/2018 SLP Individual Time: 1515-1530 SLP Individual Time Calculation (min): 15 min  Short Term Goals: Week 1: SLP Short Term Goal 1 (Week 1): STGs=LTGs due to short length of stay   Skilled Therapeutic Interventions: Pt was seen for skilled ST targeting cognitive goals.  Pt was resting in bed upon arrival but awake, alert, and agreeable to participate in therapy.  Pt could recall specific activities from therapy sessions with supervision question cues.  She needed min cues to identify current deficits and their impact on her functional independence in the home environment.  SLP reviewed and reinforced visual scanning techniques.  All questions were answered to pt's satisfaction at this time.  Continue per current plan of care.    Pain Pain Assessment Pain Scale: 0-10 Pain Score: 6  Pain Type: Chronic pain Pain Location: Hand Pain Orientation: Left Pain Descriptors / Indicators: Aching Pain Frequency: Intermittent Pain Onset: On-going Patients Stated Pain Goal: 4 Pain Intervention(s): RN made aware  Therapy/Group: Individual Therapy  Kalon Erhardt, Selinda Orion 01/10/2018, 4:29 PM

## 2018-01-11 ENCOUNTER — Inpatient Hospital Stay (HOSPITAL_COMMUNITY): Payer: Medicare Other

## 2018-01-11 ENCOUNTER — Inpatient Hospital Stay (HOSPITAL_COMMUNITY): Payer: Medicare Other | Admitting: Speech Pathology

## 2018-01-11 ENCOUNTER — Inpatient Hospital Stay (HOSPITAL_COMMUNITY): Payer: Medicare Other | Admitting: Physical Therapy

## 2018-01-11 LAB — GLUCOSE, CAPILLARY
Glucose-Capillary: 113 mg/dL — ABNORMAL HIGH (ref 70–99)
Glucose-Capillary: 129 mg/dL — ABNORMAL HIGH (ref 70–99)
Glucose-Capillary: 159 mg/dL — ABNORMAL HIGH (ref 70–99)
Glucose-Capillary: 154 mg/dL — ABNORMAL HIGH (ref 70–99)

## 2018-01-11 MED ORDER — PREDNISONE 5 MG PO TABS
10.0000 mg | ORAL_TABLET | Freq: Two times a day (BID) | ORAL | Status: AC
  Administered 2018-01-11 – 2018-01-12 (×4): 10 mg via ORAL
  Filled 2018-01-11 (×4): qty 2

## 2018-01-11 NOTE — Progress Notes (Signed)
Speech Language Pathology Weekly Progress and Session Note  Patient Details  Name: Catherine Greene MRN: [id number] Date of Birth: 16-Dec-1940  Beginning of progress report period:  October, 15, 2019  End of progress report period: January 11, 2018   Today's Date: 01/11/2018 SLP Individual Time: 1500-1530 SLP Individual Time Calculation (min): 30 min  Short Term Goals: Week 1: SLP Short Term Goal 1 (Week 1): STGs=LTGs due to short length of stay     New Short Term Goals: Week 2: SLP Short Term Goal 1 (Week 2): Continue working towards supervision LTG   Weekly Progress Updates:   Pt has made functional gains this reporting period and continues to make gains towards supervision long term goals.  Pt is currently min assist for tasks due to mild cognitive deficits.  Pt has demonstrated improved functional problem solving.  Pt and family education is ongoing.  Pt would continue to benefit from skilled ST while inpatient in order to maximize functional independence and reduce burden of care prior to discharge.  Anticipate that pt will need 24/7 supervision at discharge in addition to [place] follow up at next level of care.     Intensity: Minumum of 1-2 x/day, 30 to 90 minutes Frequency: 3 to 5 out of 7 days Duration/Length of Stay: 7-10 days  Treatment/Interventions: Cognitive remediation/compensation;Cueing hierarchy;Functional tasks;Patient/family education;Therapeutic Activities;Internal/external aids;Environmental controls   Daily Session  Skilled Therapeutic Interventions: Pt was seen for skilled ST targeting cognitive goals.  SLP facilitated the session with a novel card game targeting functional problem solving and memory goals.  Pt needed min assist cues to plan and execute a problem solving strategy due to decreased working memory of task rules and procedures.  Pt was returned to bed and left with call bell within reach and bed alarm set.  Goals updated on this date to reflect current  progress and plan of care.       General    Pain Pain Assessment Pain Scale: 0-10 Pain Score: 5  Pain Type: Acute pain Pain Location: Shoulder Pain Orientation: Left Pain Descriptors / Indicators: Aching Pain Frequency: Constant Pain Onset: On-going Patients Stated Pain Goal: 4 Pain Intervention(s): RN made aware  Therapy/Group: Individual Therapy  Daisy Mcneel, Selinda Orion 01/11/2018, 4:03 PM

## 2018-01-11 NOTE — Progress Notes (Signed)
[place] PHYSICAL MEDICINE & REHABILITATION PROGRESS NOTE   Subjective/Complaints: Left shoulder sore (like her wrist has been a long term issue). Left wrist manageable  ROS: Patient denies fever, rash, sore throat, blurred vision, nausea, vomiting, diarrhea, cough, shortness of breath or chest pain,  headache, or mood change.   Objective:   No results found. No results for input(s): WBC, HGB, HCT, PLT in the last 72 hours. No results for input(s): NA, K, CL, CO2, GLUCOSE, BUN, CREATININE, CALCIUM in the last 72 hours.  Intake/Output Summary (Last 24 hours) at 01/11/2018 0858 Last data filed at 01/10/2018 1700 Gross per 24 hour  Intake 380 ml  Output -  Net 380 ml     Physical Exam: Vital Signs Blood pressure (!) 171/61, pulse 64, temperature 99 F (37.2 C), temperature source Oral, resp. rate 18, height 5\' 2"  (1.575 m), weight 68 kg, SpO2 92 %. Constitutional: No distress . Vital signs reviewed. HEENT: EOMI, oral membranes moist Neck: supple Cardiovascular: RRR without murmur. No JVD    Respiratory: CTA Bilaterally without wheezes or rales. Normal effort    GI: BS +, non-tender, non-distended    Musc:  tender around 1st/2nd CM, less swollen. Pain left shoulder with PROM.   Neurological: Alert, mild left inattention. Likely left HH as well. LUE 2-3/5 prox to distal. LLE 2+/5 prox to distal  Decreased sensation distal LUE and LLEbut senses pain. Better attention to left Skin: intact. Psych: pleasant    Assessment/Plan: 1. Functional deficits secondary to embolic right MCA infarct which require 3+ hours per day of interdisciplinary therapy in a comprehensive inpatient rehab setting.  Physiatrist is providing close team supervision and 24 hour management of active medical problems listed below.  Physiatrist and rehab team continue to assess barriers to discharge/monitor patient progress toward functional and medical goals  Care Tool:  Bathing  Bathing activity did  not occur: Refused(pt declined) Body parts bathed by patient: Face   Body parts bathed by helper: Right lower leg, Left lower leg, Buttocks     Bathing assist Assist Level: Contact Guard/Touching assist     Upper Body Dressing/Undressing Upper body dressing Upper body dressing/undressing activity did not occur (including orthotics): (activity did not occur; late admit) What is the patient wearing?: Pull over shirt    Upper body assist Assist Level: Minimal Assistance - Patient > 75%    Lower Body Dressing/Undressing Lower body dressing    Lower body dressing activity did not occur: (activity did not occur; late admit) What is the patient wearing?: Underwear/pull up, Pants     Lower body assist Assist for lower body dressing: Minimal Assistance - Patient > 75%     Toileting Toileting    Toileting assist Assist for toileting: Minimal Assistance - Patient > 75%     Transfers Chair/bed transfer  Transfers assist     Chair/bed transfer assist level: Supervision/Verbal cueing     Locomotion Ambulation   Ambulation assist      Assist level: Supervision/Verbal cueing Assistive device: Walker-rolling Max distance: 100   Walk 10 feet activity   Assist     Assist level: Supervision/Verbal cueing Assistive device: Walker-rolling   Walk 50 feet activity   Assist    Assist level: Supervision/Verbal cueing Assistive device: Walker-rolling    Walk 150 feet activity   Assist Walk 150 feet activity did not occur: Safety/medical concerns         Walk 10 feet on uneven surface  activity   Assist Walk 10  feet on uneven surfaces activity did not occur: Safety/medical concerns         Wheelchair     Assist Will patient use wheelchair at discharge?: No   Wheelchair activity did not occur: Safety/medical concerns         Wheelchair 50 feet with 2 turns activity    Assist    Wheelchair 50 feet with 2 turns activity did not occur:  Safety/medical concerns       Wheelchair 150 feet activity     Assist Wheelchair 150 feet activity did not occur: Safety/medical concerns          Medical Problem List and Plan: 1.Left-sided weaknesssecondary to right MCA scattered infarct embolic. Status post partial revascularization of left ICA terminus as well as history of CVA --Continue CIR therapies including PT, OT, and SLP    2. DVT Prophylaxis/Anticoagulation: Eliquis 3. Pain Management:Neurontin 400 mg daily, Fioricet for headaches as needed  -hand/wrist pain likely OA  -continue voltaren gel, ice  -left wrist splint  -prednisone 10mg  bid for 2 days  -consider left shoulder injection 4. Mood:BuSpar 15 mg twice daily, Zoloft 100 mg daily 5. Neuropsych: This patientiscapable of making decisions on herown behalf. 6. Skin/Wound Care:Routine skin checks 7. Fluids/Electrolytes/Nutrition:Routine in and outs with follow-up chemistries. Encourage PO 8.Hypertension. Lopressor 100 mg twice daily, Cozaar 100 mg daily, Norvasc 10 mg daily  -fairly controlled 10/23 9.Diabetes mellitus with peripheral neuropathy. Hemoglobin A1c 5.7. Glucophage 1000 mg twice daily.   -reasonable control 10/23  -expect increase with prednisone rx 10.COPD with remote tobacco abuse. No shortness of breath reported 11.Hyperlipidemia. Pravachol     LOS: 8 days A FACE TO FACE EVALUATION WAS PERFORMED  Meredith Staggers 01/11/2018, 8:58 AM

## 2018-01-11 NOTE — Progress Notes (Signed)
   01/11/18 1600  Clinical Encounter Type  Visited With Patient and family together  Visit Type Initial;Other (Comment) (AD)  Referral From Nurse  Consult/Referral To Chaplain  Spiritual Encounters  Spiritual Needs Literature   Responded to paged request for AD.  Went over paperwork w/ pt with her daughter and pt's SO until I was paged to an ED trauma.  Apologized for having to exit, will attempt to return today (will not be able to due to length of time with trauma), and asked pt to have chaplain's ofc paged to return on Thursday if no one is able to return today.  Myra Gianotti resident, [phone]

## 2018-01-11 NOTE — Progress Notes (Signed)
Social Work Patient ID: Catherine Greene, female   DOB: 1941-02-07, 77 y.o.   MRN: [id number]   CSW met with pt and her significant other 01-10-18 to update them on team conference discussion and targeted d/c date of 01-19-18.  Pt was disappointed it is next week, but understood why she needs to be on CIR.  CSW also talked with dtr 01-10-18 via telephone.  She is pleased with LOS and knows her mother needs the therapy.  She stated that pt was asking about medical POA and living will.  CSW made referral to Spiritual Care for Advanced Directives.  CSW remains available to assist as needed.

## 2018-01-11 NOTE — Progress Notes (Signed)
Occupational Therapy Weekly Progress Note  Patient Details  Name: Catherine Greene MRN: [id number] Date of Birth: [birth year]  Beginning of progress report period: January 04, 2018 End of progress report period: January 11, 2018  Today's Date: 01/11/2018 OT Individual Time: 1100-1205 OT Individual Time Calculation (min): 65 min    Patient has met 3 of 4 short term goals.  Patient continues to make progress toward goals but continues to require A/cues due to left side weakness and visual deficit.  Patient continues to demonstrate the following deficits: muscle weakness, decreased cardiorespiratoy endurance, decreased visual perceptual skills, decreased attention to left and decreased postural control, hemiplegia and decreased balance strategies and therefore will continue to benefit from skilled OT intervention to enhance overall performance with BADL and Reduce care partner burden.  Patient progressing toward long term goals..  Continue plan of care.  OT Short Term Goals Week 1:  OT Short Term Goal 1 (Week 1): Pt will don pants EOB with min A OT Short Term Goal 1 - Progress (Week 1): Met OT Short Term Goal 2 (Week 1): Pt will complete toileting tasks with no change in vital signs OT Short Term Goal 2 - Progress (Week 1): Met OT Short Term Goal 3 (Week 1): Pt will transfer to toilet with min A  OT Short Term Goal 3 - Progress (Week 1): Met OT Short Term Goal 4 (Week 1): Pt will don shirt with min A OT Short Term Goal 4 - Progress (Week 1): Progressing toward goal Week 2:  OT Short Term Goal 1 (Week 2): STG=LTG d/t ELOS  Skilled Therapeutic Interventions/Progress Updates:    Patient in bed upon arrival, states that her shoulder is feeling better today.  Completed ambulation with RW and SPT to/from toilet, bed, recliner, w/c with CS/CG occ cues for direction and noted improvement with navigating items on the left side today.  ADL:  CM/hygiene min A, LB dressing min A, UB dressing min/mod  A. Therapeutic activities to include standing lateral reach and placement tasks to increase L UE external rotation with focus on scapular mobility and posture.  Occ cues to locate placement due to visual deficit.  Reinforced seated posture in w/c and options for improved support by using a towel roll.  Standing tolerance 5-10 minutes x2.  Patient left in room for lunch seated in the recliner with significant other present.    Therapy Documentation Precautions:  Precautions Precautions: Fall Precaution Comments: L hemi Restrictions Weight Bearing Restrictions: No General:   Vital Signs:  Pain: Pain Assessment Pain Scale: 0-10 Pain Score: 0-No pain ADL: ADL Eating: Set up Where Assessed-Eating: Bed level Grooming: Minimal cueing, Supervision/safety Where Assessed-Grooming: Sitting at sink Upper Body Bathing: Minimal assistance, Minimal cueing Where Assessed-Upper Body Bathing: Edge of bed Lower Body Bathing: Moderate assistance, Minimal cueing Where Assessed-Lower Body Bathing: Edge of bed Upper Body Dressing: Maximal assistance Where Assessed-Upper Body Dressing: Edge of bed Lower Body Dressing: Maximal assistance Where Assessed-Lower Body Dressing: Edge of bed Toileting: Maximal assistance Where Assessed-Toileting: Glass blower/designer: Moderate assistance Toilet Transfer Method: Counselling psychologist: Energy manager: Not assessed Social research officer, government: Not assessed Vision   Perception    Praxis   Exercises:   Other Treatments:     Therapy/Group: Individual Therapy  Carlos Levering 01/11/2018, 12:19 PM

## 2018-01-11 NOTE — Patient Care Conference (Signed)
Inpatient RehabilitationTeam Conference and Plan of Care Update Date: 01/10/2018   Time: 2:30 PM    Patient Name: Catherine Greene      Medical Record Number: [id number]  Date of Birth: 11-24-1940 Sex: Female         Room/Bed: 4W11C/4W11C-01 Payor Info: Payor: [place] / Plan: [facility] MEDICARE / Product Type: *No Product type* /    Admitting Diagnosis: CVA  Admit Date/Time:  01/03/2018  4:11 PM Admission Comments: No comment available   Primary Diagnosis:  Right middle cerebral artery stroke [facility]) Principal Problem: Right middle cerebral artery stroke [facility])  Patient Active Problem List   Diagnosis Date Noted  . Chronic pulmonary embolism ([place]) 01/03/2018  . Carotid stenosis, right 01/03/2018  . Right middle cerebral artery stroke ([place]) 01/03/2018  . Acute ischemic stroke ([place]) 12/28/2017  . B12 deficiency 10/12/2016  . Procedure refused 10/12/2016  . Dysthymia 08/31/2016  . Controlled substance agreement signed 05/03/2016  . Chronic right shoulder pain 02/02/2016  . Abscess of face 09/11/2015  . Nausea 09/11/2015  . Ischemic stroke ([place]) 08/30/2015  . Acute maxillary sinusitis 04/02/2015  . Chronic obstructive pulmonary disease ([place]) 03/04/2015  . Fatigue 02/20/2015  . Hypoxia 02/20/2015  . Left shoulder pain 10/21/2014  . Breast cancer screening, high risk patient 06/20/2014  . Polyneuropathy associated with underlying disease ([place]) 06/20/2014  . Back pain 11/15/2013  . Myalgia 11/15/2013  . Proteinuria 11/15/2013  . Anxiety 07/02/2013  . Colonoscopy refused 05/21/2013  . Diarrhea 05/21/2013  . Weight loss 05/21/2013  . 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 Carotid occlusion, left 07/04/2012  . Peripheral vascular disease ([place]) 07/04/2012  . Scoliosis (and kyphoscoliosis), idiopathic 07/04/2012  . Spondylolisthesis, congenital 07/04/2012  . Hyperlipidemia 07/04/2012  . Type II diabetes mellitus ([place]) 07/04/2012  . DM (diabetes  mellitus), type 2, uncontrolled, periph vascular complic ([place]) 51/04/5850  . HYPERLIPIDEMIA-MIXED 03/13/2010  . Essential hypertension 03/13/2010  . CAD (coronary artery disease), native coronary artery 03/13/2010    Expected Discharge Date: Expected Discharge Date: 01/19/18  Team Members Present: Physician leading conference: Dr. Alger Simons Social Worker Present: Alfonse Alpers, LCSW Nurse Present: Rozetta Nunnery, RN PT Present: Roderic Ovens, PT OT Present: Benay Pillow, OT SLP Present: Windell Moulding, SLP PPS Coordinator present : Daiva Nakayama, RN, CRRN     Current Status/Progress Goal Weekly Team Focus  Medical   right mca infarct with left hemiparesis. left wrist pain---OA  improve functional mobility  pain control, orthotics, stroke risk factor mgt   Bowel/Bladder   continent of bowel and bladder, LBM 10-22  remain continent of bowel and bladder, maintain regular bowel pattern  Assist with toiletin needs prn   Swallow/Nutrition/ Hydration             ADL's   mod A standing level clothing management, CGA transfers, mod A LB dressing  (S)  b/d tasks, functional activity tolerance, L UE NMR, B UE coordination   Mobility   min A transfers, supervision short distance gait  supervision overall  family ed, activity tolerance   Communication             Safety/Cognition/ Behavioral Observations  min-mod assist  supervision   visual scanning, memory, problem solving   Pain   pain managed with prn percocet q6  pain <4  Assess pain q shift and prn    Skin   no skin issues   no skin issues   assess skin q shift and prn  Rehab Goals Patient on target to meet rehab goals: Yes Rehab Goals Revised: none *See Care Plan and progress notes for long and short-term goals.     Barriers to Discharge  Current Status/Progress Possible Resolutions Date Resolved   Physician    Medical stability        see medical progress notes      Nursing                  PT                     OT                  SLP                SW                Discharge Planning/Teaching Needs:  Pt to return to her home she shares with her significant other and dtr, Juliann Pulse.  Pt with supervision level goals.  Family is here often and can participate in family education as needed.   Team Discussion:  Pt with right MCA infarction and inattention and pain in left wrist, which is now impacting her shoulder.  Dr. Naaman Plummer recommended that pt have splint on all the time, especially in therapy and at night.  Pt is continent of bowel and bladder and nursing is using gel on wrist and shoulder to help with the pain.  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  Pt can walk with supervision, but it's hard for her steer walker with splint.  Pt has all DME recommended at home.  Pt is contact guard for transfers, but mod A for dressing due to the ataxia, with supervision level goals.  Pt is min/mod A for ST, working on visual scanning and problem solving.    Revisions to Treatment Plan:  none    Continued Need for Acute Rehabilitation Level of Care: The patient requires daily medical management by a physician with specialized training in physical medicine and rehabilitation for the following conditions: Daily direction of a multidisciplinary physical rehabilitation program to ensure safe treatment while eliciting the highest outcome that is of practical value to the patient.: Yes Daily medical management of patient stability for increased activity during participation in an intensive rehabilitation regime.: Yes Daily analysis of laboratory values and/or radiology reports with any subsequent need for medication adjustment of medical intervention for : Neurological problems;Blood pressure problems   I attest that I was present, lead the team conference, and concur with the assessment and plan of the team.   Aarya Robinson, Silvestre Mesi 01/11/2018, 11:46 AM

## 2018-01-11 NOTE — Progress Notes (Signed)
Physical Therapy Session Note  Patient Details  Name: Catherine Greene MRN: [id number] Date of Birth: 03/08/1941  Today's Date: 01/11/2018 PT Individual Time: 1448-1856 PT Individual Time Calculation (min): 70 min   Short Term Goals: Week 2:  PT Short Term Goal 1 (Week 2): = LTG  Skilled Therapeutic Interventions/Progress Updates:   pt performs supine to sit with supervision.  Gait in room and bathroom with RW and supervision.  Toilet transfers with supervision, min A for clothing management, max A for hygiene.  Pt performs standing balance without UE support with supervision for hand washing. Gait in controlled and home environments with RW with supervision, pt improving ability to compensate for LT visual deficits/inattention.  Furniture transfer to sofa as pt states she sleeps on sofa at home.  Pt able to perform with supervision and increased time.  Standing visual scanning task with card matching with pt requiring min A to correct errors, min A for balance without UE support.  nustep for continued activity tolerance x 6 minutes with 1 rest break, level 2.  Pt left in room with nursing present, needs at hand.  Therapy Documentation Precautions:  Precautions Precautions: Fall Precaution Comments: L hemi Restrictions Weight Bearing Restrictions: No Pain:  pt c/o Lt shoulder pain during session, meds given prior to session, repositioned as needed   Therapy/Group: Individual Therapy  Kaoru Rezendes 01/11/2018, 9:58 AM

## 2018-01-11 NOTE — Progress Notes (Signed)
Physical Therapy Session Note  Patient Details  Name: Catherine Greene MRN: [id number] Date of Birth: [birth year]  Today's Date: 01/11/2018 PT Individual Time:1700-1730   30 min   Short Term Goals:  Week 2:  PT Short Term Goal 1 (Week 2): = LTG  Skilled Therapeutic Interventions/Progress Updates:   Pt received supine in bed and agreeable to PT. Supine>sit transfer with supervision assist and min cues. Sit<>stand from EOB with supervision assist from PT and min cues for AD management.   Gait training instructed by PT 2x 146f with superivison assist and RW. Min cues for attention to the LUE position on RW and obstacles on the L side.   PT instructed pt in dynamic balance training using Wii fit board. Penguin slide x 3 and tilt table x 1. Min assist overall from PT and max cues for attention and visual scanning of the L side  [facility] and all needs met.     Therapy Documentation Precautions:  Precautions Precautions: Fall Precaution Comments: L hemi Restrictions Weight Bearing Restrictions: No Vital Signs: Therapy Vitals Temp: 97.9 F (36.6 C) Temp Source: Oral Pulse Rate: 73 Resp: 17 BP: (!) 135/54 Patient Position (if appropriate): Lying Oxygen Therapy SpO2: 93 % O2 Device: Room Air Pain: Pain Assessment Pain Scale: 0-10 Pain Score: 5  Pain Type: Acute pain Pain Location: Shoulder Pain Orientation: Left Pain Descriptors / Indicators: Aching Pain Frequency: Constant Pain Onset: On-going Patients Stated Pain Goal: 4 Pain Intervention(s): RN made aware    Therapy/Group: Individual Therapy  ALorie Phenix10/23/2019, 5:17 PM

## 2018-01-12 ENCOUNTER — Inpatient Hospital Stay (HOSPITAL_COMMUNITY): Payer: Medicare Other

## 2018-01-12 ENCOUNTER — Inpatient Hospital Stay (HOSPITAL_COMMUNITY): Payer: Medicare Other | Admitting: Speech Pathology

## 2018-01-12 ENCOUNTER — Inpatient Hospital Stay (HOSPITAL_COMMUNITY): Payer: Medicare Other | Admitting: Physical Therapy

## 2018-01-12 LAB — GLUCOSE, CAPILLARY
Glucose-Capillary: 121 mg/dL — ABNORMAL HIGH (ref 70–99)
Glucose-Capillary: 148 mg/dL — ABNORMAL HIGH (ref 70–99)
Glucose-Capillary: 140 mg/dL — ABNORMAL HIGH (ref 70–99)
Glucose-Capillary: 185 mg/dL — ABNORMAL HIGH (ref 70–99)

## 2018-01-12 NOTE — Progress Notes (Signed)
Physical Therapy Session Note  Patient Details  Name: Catherine Greene MRN: [id number] Date of Birth: 1940-12-26  Today's Date: 01/12/2018 PT Individual Time: 1300-1400 PT Individual Time Calculation (min): 60 min   Short Term Goals: Week 2:  PT Short Term Goal 1 (Week 2): = LTG  Skilled Therapeutic Interventions/Progress Updates:    Pt received seated in bed, agreeable to PT. Pt reports some ongoing pain in L shoulder, not rated, ice pack to L shoulder at end of therapy session for some relief. Bed mobility Supervision. Sit to stand Supervision to RW. Ambulation from bed to toilet with RW and Supervision with v/c to attend to L side to avoid obstacles and for safe RW management. Toilet transfer and 3/3 toileting steps with Supervision. Standing balance while washing hands with Supervision and v/c to attend to L side. Ambulation x 100 ft with RW and Supervision with v/c for safe RW management and attention to the L. Trial ambulation with rail in hallway and min A for balance, 2 x 10 ft. Pt reports ongoing pain in L shoulder even when not using it with RW. Side-steps 2 x 10 ft L/R with BUE support and CGA. Nustep level 4 x 5 min and level 3 x 5 min with BLE only for global endurance. Assisted pt back to bed at end of session, Supervision. Pt left semi-reclined in bed with needs in reach, bed alarm in place, family present.  Therapy Documentation Precautions:  Precautions Precautions: Fall Precaution Comments: L hemi Restrictions Weight Bearing Restrictions: No   Therapy/Group: Individual Therapy  Excell Seltzer, PT, DPT  01/12/2018, 3:47 PM

## 2018-01-12 NOTE — Progress Notes (Signed)
Speech Language Pathology Daily Session Note  Patient Details  Name: Catherine Greene MRN: [id number] Date of Birth: [birth year]  Today's Date: 01/12/2018 SLP Individual Time: 1005-1100 SLP Individual Time Calculation (min): 55 min  Short Term Goals: Week 2: SLP Short Term Goal 1 (Week 2): Continue working towards supervision LTG   Skilled Therapeutic Interventions:  Pt was seen for skilled ST targeting cognitive goals.  Therapist facilitated the session with a scavenger hunt activity around the unit to address visual scanning.  Pt needed overall mod assist verbal and visual cues to locate listed items from around the unit due to decreased visual scanning to the left of midline.  Pt was returned to room where she completed a word search puzzle with min assist verbal cues for scanning.  Pt was left in wheelchair with call bell within reach and significant other at bedside.  Continue per current plan of care.    Pain Pain Assessment Pain Scale: 0-10 Pain Score: 4  Pain Type: Acute pain Pain Location: Shoulder Pain Orientation: Left Pain Descriptors / Indicators: Aching Pain Onset: On-going Patients Stated Pain Goal: 1 Pain Intervention(s): RN made aware  Therapy/Group: Individual Therapy  Johnson Arizola, Elmyra Ricks L 01/12/2018, 11:01 AM

## 2018-01-12 NOTE — Progress Notes (Signed)
   01/12/18 1300  Clinical Encounter Type  Visited With Patient;Health care provider  Visit Type Follow-up;Other (Comment) (AD)  Recommendations page chaplain when ready to finalize AD or if have add'l questions   Spoke briefly w/ pt as she was w/ PT.  AD is not filled out yet.  Let pt know to get ink pen, if still needed, from nurses' station and to ask staff to page chaplain when ready to finalize forms.    Myra Gianotti resident, [phone]

## 2018-01-12 NOTE — Progress Notes (Signed)
Occupational Therapy Session Note  Patient Details  Name: Catherine Greene MRN: [id number] Date of Birth: 1940/12/15  Today's Date: 01/12/2018 OT Individual Time: 0845-1000 OT Individual Time Calculation (min): 75 min    Short Term Goals: Week 2:  OT Short Term Goal 1 (Week 2): STG=LTG d/t ELOS  Skilled Therapeutic Interventions/Progress Updates:    Session focused on b/d tasks. Pt completed functional mobility into bathroom with RW and distant (S). Pt demo improved L attention, requiring no cueing for RW management. Pt completed all bathing at seated level on TTB in shower with (S) only. Pt able to reach posteriorly to clean bottom and feet- great improvement! With shirt correctly oriented and laid in front of pt, pt able to don shirt with (S) only- no cueing. Min cueing provided to correctly orient underwear. CGA provided in standing for pt to pull up. Pt donned pants with CGA. Pt more alert and talkative this session-with great improvements in apraxia overall. Pt still required frequent rest breaks for SOB and fatigue. Pt completed 100 ft of functional mobility down to therapy gym with CGA using RW. Min cueing for RW management. RW was adjusted to ensure proper height for pt and hopefully to alleviate some shoulder pain pt was experiencing. Pt stood with her back against a wall and completed guided scapular retraction and head extension exercises to promote more upright posture. Tactile cues provided for opening up chest during stretch and proper form. Pt edu re use of a rolled up towel to facilitate a pectoralis stretch when laying supine. Pt returned to room and left sitting up in w/c awaiting SLP arrival.   Therapy Documentation Precautions:  Precautions Precautions: Fall Precaution Comments: L hemi Restrictions Weight Bearing Restrictions: No   Pain: Pain Assessment Pain Scale: 0-10 Pain Score: 0-No pain Pain Type: Acute pain Pain Location: Shoulder Pain Orientation: Left Pain  Descriptors / Indicators: Aching Pain Onset: On-going Patients Stated Pain Goal: 1 Pain Intervention(s): Medication (See eMAR)   Therapy/Group: Individual Therapy  Curtis Sites 01/12/2018, 10:37 AM

## 2018-01-12 NOTE — Progress Notes (Signed)
PHYSICAL MEDICINE & REHABILITATION PROGRESS NOTE   Subjective/Complaints: Left shoulder and wrist feeling better. Able to do more with therapy yesterday.   ROS: Patient denies fever, rash, sore throat, blurred vision, nausea, vomiting, diarrhea, cough, shortness of breath or chest pain, headache, or mood change.   Objective:   No results found. No results for input(s): WBC, HGB, HCT, PLT in the last 72 hours. No results for input(s): NA, K, CL, CO2, GLUCOSE, BUN, CREATININE, CALCIUM in the last 72 hours.  Intake/Output Summary (Last 24 hours) at 01/12/2018 0938 Last data filed at 01/12/2018 0755 Gross per 24 hour  Intake 360 ml  Output -  Net 360 ml     Physical Exam: Vital Signs Blood pressure (!) 147/57, pulse 69, temperature 98 F (36.7 C), temperature source Oral, resp. rate 19, height 5\' 2"  (1.575 m), weight 68 kg, SpO2 93 %. Constitutional: No distress . Vital signs reviewed. HEENT: EOMI, oral membranes moist Neck: supple Cardiovascular: RRR without murmur. No JVD    Respiratory: CTA Bilaterally without wheezes or rales. Normal effort    GI: BS +, non-tender, non-distended  Musc:  Left wrist and shoulder less tender. She's more easily able to activate   Neurological: Alert, mild left inattention. Likely left HH as well. LUE 2-3/5 prox to distal. LLE 2+/5 prox to distal. Sl altered sensation LUE and LLE.  Better attention to left Skin: intact. Psych: pleasant    Assessment/Plan: 1. Functional deficits secondary to embolic right MCA infarct which require 3+ hours per day of interdisciplinary therapy in a comprehensive inpatient rehab setting.  Physiatrist is providing close team supervision and 24 hour management of active medical problems listed below.  Physiatrist and rehab team continue to assess barriers to discharge/monitor patient progress toward functional and medical goals  Care Tool:  Bathing  Bathing activity did not occur: Refused(pt  declined) Body parts bathed by patient: Right arm, Left arm, Chest, Abdomen, Front perineal area, Buttocks, Right upper leg, Left upper leg, Right lower leg, Left lower leg, Face   Body parts bathed by helper: Right lower leg, Left lower leg, Buttocks     Bathing assist Assist Level: Contact Guard/Touching assist     Upper Body Dressing/Undressing Upper body dressing Upper body dressing/undressing activity did not occur (including orthotics): (activity did not occur; late admit) What is the patient wearing?: Pull over shirt    Upper body assist Assist Level: Contact Guard/Touching assist    Lower Body Dressing/Undressing Lower body dressing    Lower body dressing activity did not occur: (activity did not occur; late admit) What is the patient wearing?: Underwear/pull up, Pants     Lower body assist Assist for lower body dressing: Contact Guard/Touching assist     Toileting Toileting    Toileting assist Assist for toileting: Contact Guard/Touching assist     Transfers Chair/bed transfer  Transfers assist     Chair/bed transfer assist level: Supervision/Verbal cueing     Locomotion Ambulation   Ambulation assist      Assist level: Supervision/Verbal cueing Assistive device: Walker-rolling Max distance: 100   Walk 10 feet activity   Assist     Assist level: Supervision/Verbal cueing Assistive device: Walker-rolling   Walk 50 feet activity   Assist    Assist level: Supervision/Verbal cueing Assistive device: Walker-rolling    Walk 150 feet activity   Assist Walk 150 feet activity did not occur: Safety/medical concerns         Walk 10 feet on uneven  surface  activity   Assist Walk 10 feet on uneven surfaces activity did not occur: Safety/medical concerns         Wheelchair     Assist Will patient use wheelchair at discharge?: No   Wheelchair activity did not occur: Safety/medical concerns         Wheelchair 50 feet with  2 turns activity    Assist    Wheelchair 50 feet with 2 turns activity did not occur: Safety/medical concerns       Wheelchair 150 feet activity     Assist Wheelchair 150 feet activity did not occur: Safety/medical concerns          Medical Problem List and Plan: 1.Left-sided weaknesssecondary to right MCA scattered infarct embolic. Status post partial revascularization of left ICA terminus as well as history of CVA --Continue CIR therapies including PT, OT, and SLP     2. DVT Prophylaxis/Anticoagulation: Eliquis 3. Pain Management:Neurontin 400 mg daily, Fioricet for headaches as needed  -left shoulder, hand/wrist pain likely degnerative  -continue voltaren gel, ice  -left wrist splint  -prednisone 10mg  bid for 2 days (day 2/2)    4. Mood:BuSpar 15 mg twice daily, Zoloft 100 mg daily 5. Neuropsych: This patientiscapable of making decisions on herown behalf. 6. Skin/Wound Care:Routine skin checks 7. Fluids/Electrolytes/Nutrition:Routine in and outs with follow-up chemistries. Encourage PO 8.Hypertension. Lopressor 100 mg twice daily, Cozaar 100 mg daily, Norvasc 10 mg daily  -fairly controlled 10/24 9.Diabetes mellitus with peripheral neuropathy. Hemoglobin A1c 5.7. Glucophage 1000 mg twice daily.   -fair control 10/24  -only mild increase with prednisone rx 10.COPD with remote tobacco abuse. No shortness of breath reported 11.Hyperlipidemia. Pravachol     LOS: 9 days A FACE TO FACE EVALUATION WAS PERFORMED  Meredith Staggers 01/12/2018, 9:38 AM

## 2018-01-13 ENCOUNTER — Inpatient Hospital Stay (HOSPITAL_COMMUNITY): Payer: Medicare Other | Admitting: Physical Therapy

## 2018-01-13 ENCOUNTER — Inpatient Hospital Stay (HOSPITAL_COMMUNITY): Payer: Medicare Other | Admitting: Speech Pathology

## 2018-01-13 ENCOUNTER — Inpatient Hospital Stay (HOSPITAL_COMMUNITY): Payer: Medicare Other

## 2018-01-13 LAB — GLUCOSE, CAPILLARY
GLUCOSE-CAPILLARY: 95 mg/dL (ref 70–99)
GLUCOSE-CAPILLARY: 155 mg/dL — AB (ref 70–99)
Glucose-Capillary: 104 mg/dL — ABNORMAL HIGH (ref 70–99)
Glucose-Capillary: 186 mg/dL — ABNORMAL HIGH (ref 70–99)

## 2018-01-13 MED ORDER — LIDOCAINE HCL 1 % IJ SOLN
5.0000 mL | Freq: Once | INTRAMUSCULAR | Status: AC
  Administered 2018-01-13: 5 mL
  Filled 2018-01-13: qty 5

## 2018-01-13 MED ORDER — TRIAMCINOLONE ACETONIDE 40 MG/ML IJ SUSP
40.0000 mg | Freq: Once | INTRAMUSCULAR | Status: AC
  Administered 2018-01-13: 40 mg via INTRA_ARTICULAR
  Filled 2018-01-13: qty 1

## 2018-01-13 NOTE — Progress Notes (Signed)
Speech Language Pathology Daily Session Note  Patient Details  Name: Catherine Greene MRN: [id number] Date of Birth: 12-Mar-1941  Today's Date: 01/13/2018 SLP Individual Time: 0950-1045 SLP Individual Time Calculation (min): 55 min  Short Term Goals: Week 2: SLP Short Term Goal 1 (Week 2): Continue working towards supervision LTG   Skilled Therapeutic Interventions:  Pt was seen for skilled ST targeting cognitive goals.  SLP facilitated the session with functional reading tasks to address visual scanning.  Pt needed max to total cues to read newspaper print from an article, not only due to visual scanning deficits but also from decreased visual acuity.  Pt reports her prescription for her glasses is not strong enough any more and her eyes water when she tries to focus them to read.  Pt only needed mod assist to read large print from a newspaper advertisement.  With large print, acuity deficits were more easily compensated for but pt still needed cues to fully scan to the left of midline.  Pt was left in recliner with all needs within reach.  Continue per current plan of care.    Pain Pain Assessment Pain Scale: 0-10 Pain Score: 3  Pain Type: Acute pain Pain Location: Shoulder Pain Orientation: Left Pain Descriptors / Indicators: Aching Pain Frequency: Constant Pain Onset: On-going Patients Stated Pain Goal: 0 Pain Intervention(s): Repositioned Multiple Pain Sites: No  Therapy/Group: Individual Therapy  Fergie Sherbert, Selinda Orion 01/13/2018, 10:46 AM

## 2018-01-13 NOTE — Progress Notes (Signed)
Physical Therapy Session Note  Patient Details  Name: Catherine Greene MRN: [id number] Date of Birth: Jul 23, 1940  Today's Date: 01/13/2018 PT Individual Time: 0800-0930 PT Individual Time Calculation (min): 90 min   Short Term Goals: Week 2:  PT Short Term Goal 1 (Week 2): = LTG  Skilled Therapeutic Interventions/Progress Updates:    Pt received seated in bed, agreeable to PT. Pt reports ongoing pain in L shoulder, not rated. Pt receives pain medication from RN and cream to L shoulder for pain management. Pt performs bed mobility with Supervision. SPT bed to w/c with RW and Supervision. Ambulation 3 x 100 ft with RW and Supervision while scanning L/R to find colored targets and collect them with use of LUE, min cueing for safe RW management and to avoid obstacles. Toilet transfer with Supervision, pt is Supervision for 3/3 toileting steps. Standing balance Wii Fit game: tilt table with BUE support on RW and min A for multidirectional weight shift at hips in response to visual cues; ski game with BUE support on RW and min A for weight shift L/R in response to visual cues. Ambulation with RW and Supervision through obstacle course with focus on attending to obstacles in L visual field, pt requires mod cueing to avoid obstacles. SPT w/c to bed with Supervision and RW. Sit to supine Supervision. Pt left semi-reclined in bed with needs in reach, bed alarm in place, ice pack to L shoulder.  Therapy Documentation Precautions:  Precautions Precautions: Fall Precaution Comments: L hemi Restrictions Weight Bearing Restrictions: No   Therapy/Group: Individual Therapy  Excell Seltzer, PT, DPT  01/13/2018, 10:00 AM

## 2018-01-13 NOTE — Plan of Care (Signed)
  Problem: RH BOWEL ELIMINATION Goal: RH STG MANAGE BOWEL WITH ASSISTANCE Description STG Manage Bowel with mod I Assistance.  Outcome: Progressing   Problem: RH SAFETY Goal: RH STG ADHERE TO SAFETY PRECAUTIONS W/ASSISTANCE/DEVICE Description STG Adhere to Safety Precautions With cues/ reminders Assistance/Device.  Outcome: Progressing   Problem: RH PAIN MANAGEMENT Goal: RH STG PAIN MANAGED AT OR BELOW PT'S PAIN GOAL Description At or below level 4  Outcome: Progressing   Problem: RH KNOWLEDGE DEFICIT Goal: RH STG INCREASE KNOWLEDGE OF DIABETES Description Manage DM with diet, medication and state understanding of need to check CBG routinely with cues/reminders  Outcome: Progressing Goal: RH STG INCREASE KNOWLEDGE OF HYPERTENSION Description  state understanding of how to Manage HTN with medication, and diet  using resources provided with min assist  Outcome: Progressing Goal: RH STG INCREASE KNOWLEGDE OF HYPERLIPIDEMIA Description Pt will be able to state and demo understanding of management of HLD using resources provided with min assist  Outcome: Progressing Goal: RH STG INCREASE KNOWLEDGE OF STROKE PROPHYLAXIS Description Pt will be able to demo understanding of prevention of secondary stroke using resources and min assist  Outcome: Progressing   Problem: Consults Goal: RH STROKE PATIENT EDUCATION Description See Patient Education module for education specifics  Outcome: Progressing   Problem: RH Vision Goal: RH LTG Vision (Specify) Outcome: Progressing

## 2018-01-13 NOTE — Progress Notes (Signed)
C[facility] PHYSICAL MEDICINE & REHABILITATION PROGRESS NOTE   Subjective/Complaints: Left shoulder and wrist feeling better, but shoulder still limiting in therapy and with activity.   ROS: Patient denies fever, rash, sore throat, blurred vision, nausea, vomiting, diarrhea, cough, shortness of breath or chest pain,   headache, or mood change.    Objective:   No results found. No results for input(s): WBC, HGB, HCT, PLT in the last 72 hours. No results for input(s): NA, K, CL, CO2, GLUCOSE, BUN, CREATININE, CALCIUM in the last 72 hours.  Intake/Output Summary (Last 24 hours) at 01/13/2018 0905 Last data filed at 01/13/2018 0823 Gross per 24 hour  Intake 360 ml  Output -  Net 360 ml     Physical Exam: Vital Signs Blood pressure (!) 178/63, pulse 66, temperature (!) 97.5 F (36.4 C), temperature source Oral, resp. rate 18, height 5\' 2"  (1.575 m), weight 68 kg, SpO2 95 %. Constitutional: No distress . Vital signs reviewed. HEENT: EOMI, oral membranes moist Neck: supple Cardiovascular: RRR without murmur. No JVD    Respiratory: CTA Bilaterally without wheezes or rales. Normal effort    GI: BS +, non-tender, non-distended  Musc: moving left shoulder and wrist better   Neurological: Alert, mild left inattention. Likely left HH as well. LUE 3/5 prox to distal. LLE 2+-3/5 prox to distal. Sl altered sensation LUE and LLE.  Better attention to left Skin: intact. Psych: pleasant    Assessment/Plan: 1. Functional deficits secondary to embolic right MCA infarct which require 3+ hours per day of interdisciplinary therapy in a comprehensive inpatient rehab setting.  Physiatrist is providing close team supervision and 24 hour management of active medical problems listed below.  Physiatrist and rehab team continue to assess barriers to discharge/monitor patient progress toward functional and medical goals  Care Tool:  Bathing  Bathing activity did not occur: Refused(pt  declined) Body parts bathed by patient: Right arm, Left arm, Chest, Abdomen, Front perineal area, Buttocks, Right upper leg, Left upper leg, Right lower leg, Left lower leg, Face   Body parts bathed by helper: Right lower leg, Left lower leg, Buttocks     Bathing assist Assist Level: Contact Guard/Touching assist     Upper Body Dressing/Undressing Upper body dressing Upper body dressing/undressing activity did not occur (including orthotics): (activity did not occur; late admit) What is the patient wearing?: Pull over shirt    Upper body assist Assist Level: Contact Guard/Touching assist    Lower Body Dressing/Undressing Lower body dressing    Lower body dressing activity did not occur: (activity did not occur; late admit) What is the patient wearing?: Underwear/pull up, Pants     Lower body assist Assist for lower body dressing: Contact Guard/Touching assist     Toileting Toileting    Toileting assist Assist for toileting: Contact Guard/Touching assist     Transfers Chair/bed transfer  Transfers assist     Chair/bed transfer assist level: Supervision/Verbal cueing     Locomotion Ambulation   Ambulation assist      Assist level: Supervision/Verbal cueing Assistive device: Walker-rolling Max distance: 100'   Walk 10 feet activity   Assist     Assist level: Supervision/Verbal cueing Assistive device: Walker-rolling   Walk 50 feet activity   Assist    Assist level: Supervision/Verbal cueing Assistive device: Walker-rolling    Walk 150 feet activity   Assist Walk 150 feet activity did not occur: Safety/medical concerns         Walk 10 feet on uneven surface  activity   Assist Walk 10 feet on uneven surfaces activity did not occur: Safety/medical concerns         Wheelchair     Assist Will patient use wheelchair at discharge?: No   Wheelchair activity did not occur: Safety/medical concerns         Wheelchair 50 feet with  2 turns activity    Assist    Wheelchair 50 feet with 2 turns activity did not occur: Safety/medical concerns       Wheelchair 150 feet activity     Assist Wheelchair 150 feet activity did not occur: Safety/medical concerns          Medical Problem List and Plan: 1.Left-sided weaknesssecondary to right MCA scattered infarct embolic. Status post partial revascularization of left ICA terminus as well as history of CVA --Continue CIR therapies including PT, OT, and SLP     2. DVT Prophylaxis/Anticoagulation: Eliquis 3. Pain Management:Neurontin 400 mg daily, Fioricet for headaches as needed  -left shoulder, hand/wrist pain likely degnerative  -continue voltaren gel, ice  -left wrist splint  -prednisone 10mg  bid for 2 days  -will inject left shoulder today 4. Mood:BuSpar 15 mg twice daily, Zoloft 100 mg daily 5. Neuropsych: This patientiscapable of making decisions on herown behalf. 6. Skin/Wound Care:Routine skin checks 7. Fluids/Electrolytes/Nutrition:encourage PO 8.Hypertension. Lopressor 100 mg twice daily, Cozaar 100 mg daily, Norvasc 10 mg daily  -fairly controlled 10/25 9.Diabetes mellitus with peripheral neuropathy. Hemoglobin A1c 5.7. Glucophage 1000 mg twice daily.   -fair control 10/25    10.COPD with remote tobacco abuse. No shortness of breath reported 11.Hyperlipidemia. Pravachol     LOS: 10 days A FACE TO FACE EVALUATION WAS PERFORMED  Meredith Staggers 01/13/2018, 9:05 AM

## 2018-01-13 NOTE — Progress Notes (Signed)
Occupational Therapy Session Note  Patient Details  Name: Catherine Greene MRN: [id number] Date of Birth: 01-28-1941  Today's Date: 01/13/2018 OT Individual Time: 1100-1209 OT Individual Time Calculation (min): 69 min   Short Term Goals: Week 2:  OT Short Term Goal 1 (Week 2): STG=LTG d/t ELOS  Skilled Therapeutic Interventions/Progress Updates:    Pt received sitting up in recliner agreeable to therapy with no c/o pain.  (S) provided during 150 ft of functional mobility with RW. Min cueing for L attention. Pt stood and completed functional reaching crossing midline to the L with (S) only for dynamic balance. Occasional cueing for body mechanics and technique. Pt stood and reached overhead anteriorly to encourage improved posture and proper pelvis position. Pt c/o shoulder pain following activity, alleviated with rest break.  Pt stood with her back to the wall and completed scapular retraction and head extension exercises with cueing for technique and muscle activation. Pt edu re proper posture and associated pain with prolonged kyphotic posture. Pt sat on wedge edge of mat to promote pelvis anterior tilting and completed [place] with her L UE to promote NMR. Pt returned to her room with (S) and RW. Pt completed toileting tasks with CGA. RN alerted to pt having very loose stool. Pt returned to supine and was left with al needs met and husband present.   Therapy Documentation Precautions:  Precautions Precautions: Fall Precaution Comments: L hemi Restrictions Weight Bearing Restrictions: No Pain: Pain Assessment Pain Scale: 0-10 Pain Score: 3  Pain Type: Acute pain Pain Location: Shoulder Pain Orientation: Left Pain Descriptors / Indicators: Aching Pain Onset: On-going Pain Intervention(s): Environmental changes;Ambulation/increased activity   Therapy/Group: Individual Therapy  Curtis Sites 01/13/2018, 12:10 PM

## 2018-01-14 DIAGNOSIS — M75102 Unspecified rotator cuff tear or rupture of left shoulder, not specified as traumatic: Secondary | ICD-10-CM

## 2018-01-14 LAB — GLUCOSE, CAPILLARY
GLUCOSE-CAPILLARY: 131 mg/dL — AB (ref 70–99)
GLUCOSE-CAPILLARY: 180 mg/dL — AB (ref 70–99)
Glucose-Capillary: 112 mg/dL — ABNORMAL HIGH (ref 70–99)
Glucose-Capillary: 153 mg/dL — ABNORMAL HIGH (ref 70–99)

## 2018-01-14 NOTE — Plan of Care (Signed)
  Problem: RH BOWEL ELIMINATION Goal: RH STG MANAGE BOWEL WITH ASSISTANCE Description STG Manage Bowel with mod I Assistance.  Outcome: Progressing   Problem: RH SAFETY Goal: RH STG ADHERE TO SAFETY PRECAUTIONS W/ASSISTANCE/DEVICE Description STG Adhere to Safety Precautions With cues/ reminders Assistance/Device.  Outcome: Progressing   Problem: RH PAIN MANAGEMENT Goal: RH STG PAIN MANAGED AT OR BELOW PT'S PAIN GOAL Description At or below level 4  Outcome: Progressing   Problem: RH KNOWLEDGE DEFICIT Goal: RH STG INCREASE KNOWLEDGE OF DIABETES Description Manage DM with diet, medication and state understanding of need to check CBG routinely with cues/reminders  Outcome: Progressing Goal: RH STG INCREASE KNOWLEDGE OF HYPERTENSION Description  state understanding of how to Manage HTN with medication, and diet  using resources provided with min assist  Outcome: Progressing Goal: RH STG INCREASE KNOWLEGDE OF HYPERLIPIDEMIA Description Pt will be able to state and demo understanding of management of HLD using resources provided with min assist  Outcome: Progressing Goal: RH STG INCREASE KNOWLEDGE OF STROKE PROPHYLAXIS Description Pt will be able to demo understanding of prevention of secondary stroke using resources and min assist  Outcome: Progressing   Problem: Consults Goal: RH STROKE PATIENT EDUCATION Description See Patient Education module for education specifics  Outcome: Progressing   Problem: RH Vision Goal: RH LTG Vision (Specify) Outcome: Progressing

## 2018-01-14 NOTE — Progress Notes (Signed)
[place] PHYSICAL MEDICINE & REHABILITATION PROGRESS NOTE   Subjective/Complaints: Had a good night. States that shoulder injection really helped.   ROS: Patient denies fever, rash, sore throat, blurred vision, nausea, vomiting, diarrhea, cough, shortness of breath or chest pain, joint or back pain, headache, or mood change.     Objective:   No results found. No results for input(s): WBC, HGB, HCT, PLT in the last 72 hours. No results for input(s): NA, K, CL, CO2, GLUCOSE, BUN, CREATININE, CALCIUM in the last 72 hours.  Intake/Output Summary (Last 24 hours) at 01/14/2018 1153 Last data filed at 01/14/2018 0812 Gross per 24 hour  Intake 942 ml  Output -  Net 942 ml     Physical Exam: Vital Signs Blood pressure (!) 174/69, pulse 62, temperature 98.2 F (36.8 C), temperature source Oral, resp. rate 19, height 5\' 2"  (1.575 m), weight 68 kg, SpO2 90 %. Constitutional: No distress . Vital signs reviewed. HEENT: EOMI, oral membranes moist Neck: supple Cardiovascular: RRR without murmur. No JVD    Respiratory: CTA Bilaterally without wheezes or rales. Normal effort    GI: BS +, non-tender, non-distended  Musc: left shoulder less tender  Neurological:   LUE 3/5 prox to distal. LLE 2+-3/5 prox to distal. Sl altered sensation LUE and LLE.  Better attention to left Skin: intact. Psych: cooperative    Assessment/Plan: 1. Functional deficits secondary to embolic right MCA infarct which require 3+ hours per day of interdisciplinary therapy in a comprehensive inpatient rehab setting.  Physiatrist is providing close team supervision and 24 hour management of active medical problems listed below.  Physiatrist and rehab team continue to assess barriers to discharge/monitor patient progress toward functional and medical goals  Care Tool:  Bathing  Bathing activity did not occur: Refused(pt declined) Body parts bathed by patient: Right arm, Left arm, Chest, Abdomen, Front perineal  area, Buttocks, Right upper leg, Left upper leg, Right lower leg, Left lower leg, Face   Body parts bathed by helper: Right lower leg, Left lower leg, Buttocks     Bathing assist Assist Level: Contact Guard/Touching assist     Upper Body Dressing/Undressing Upper body dressing Upper body dressing/undressing activity did not occur (including orthotics): (activity did not occur; late admit) What is the patient wearing?: Pull over shirt    Upper body assist Assist Level: Contact Guard/Touching assist    Lower Body Dressing/Undressing Lower body dressing    Lower body dressing activity did not occur: (activity did not occur; late admit) What is the patient wearing?: Underwear/pull up, Pants     Lower body assist Assist for lower body dressing: Contact Guard/Touching assist     Toileting Toileting    Toileting assist Assist for toileting: Contact Guard/Touching assist     Transfers Chair/bed transfer  Transfers assist     Chair/bed transfer assist level: Supervision/Verbal cueing     Locomotion Ambulation   Ambulation assist      Assist level: Supervision/Verbal cueing Assistive device: Walker-rolling Max distance: 100'   Walk 10 feet activity   Assist     Assist level: Supervision/Verbal cueing Assistive device: Walker-rolling   Walk 50 feet activity   Assist    Assist level: Supervision/Verbal cueing Assistive device: Walker-rolling    Walk 150 feet activity   Assist Walk 150 feet activity did not occur: Safety/medical concerns         Walk 10 feet on uneven surface  activity   Assist Walk 10 feet on uneven surfaces activity did  not occur: Safety/medical concerns         Wheelchair     Assist Will patient use wheelchair at discharge?: No   Wheelchair activity did not occur: Safety/medical concerns         Wheelchair 50 feet with 2 turns activity    Assist    Wheelchair 50 feet with 2 turns activity did not occur:  Safety/medical concerns       Wheelchair 150 feet activity     Assist Wheelchair 150 feet activity did not occur: Safety/medical concerns          Medical Problem List and Plan: 1.Left-sided weaknesssecondary to right MCA scattered infarct embolic. Status post partial revascularization of left ICA terminus as well as history of CVA --Continue CIR therapies including PT, OT, and SLP     2. DVT Prophylaxis/Anticoagulation: Eliquis 3. Pain Management:Neurontin 400 mg daily, Fioricet for headaches as needed  -left shoulder, hand/wrist pain likely degnerative  -continue voltaren gel, ice  -left wrist splint  -prednisone 10mg  bid for 2 days  -(late entry for 10/25) After informed consent and preparation of the skin with betadine and isopropyl alcohol, I injected 6mg  (1cc) of celestone and 4cc of 1% lidocaine into the left subacromial space via lateral approach. Additionally, aspiration was performed prior to injection. The patient tolerated well, and no complications were encountered. Afterward the area was cleaned and dressed. Post- injection instructions were provided.   4. Mood:BuSpar 15 mg twice daily, Zoloft 100 mg daily 5. Neuropsych: This patientiscapable of making decisions on herown behalf. 6. Skin/Wound Care:Routine skin checks 7. Fluids/Electrolytes/Nutrition:encourage PO 8.Hypertension. Lopressor 100 mg twice daily, Cozaar 100 mg daily, Norvasc 10 mg daily  -fairly controlled 10/26 9.Diabetes mellitus with peripheral neuropathy. Hemoglobin A1c 5.7. Glucophage 1000 mg twice daily.   -fair control 10/26    10.COPD with remote tobacco abuse. No shortness of breath reported 11.Hyperlipidemia. Pravachol     LOS: 11 days A FACE TO FACE EVALUATION WAS PERFORMED  Meredith Staggers 01/14/2018, 11:53 AM

## 2018-01-15 ENCOUNTER — Inpatient Hospital Stay (HOSPITAL_COMMUNITY): Payer: Medicare Other | Admitting: Occupational Therapy

## 2018-01-15 LAB — GLUCOSE, CAPILLARY
Glucose-Capillary: 136 mg/dL — ABNORMAL HIGH (ref 70–99)
Glucose-Capillary: 91 mg/dL (ref 70–99)
Glucose-Capillary: 152 mg/dL — ABNORMAL HIGH (ref 70–99)
Glucose-Capillary: 119 mg/dL — ABNORMAL HIGH (ref 70–99)

## 2018-01-15 NOTE — Progress Notes (Signed)
[facility] PHYSICAL MEDICINE & REHABILITATION PROGRESS NOTE   Subjective/Complaints: No new complaints. Left shoulder feeling well. Didn't have therapy yesterday.   ROS: Patient denies fever, rash, sore throat, blurred vision, nausea, vomiting, diarrhea, cough, shortness of breath or chest pain, joint or back pain, headache, or mood change.   Objective:   No results found. No results for input(s): WBC, HGB, HCT, PLT in the last 72 hours. No results for input(s): NA, K, CL, CO2, GLUCOSE, BUN, CREATININE, CALCIUM in the last 72 hours.  Intake/Output Summary (Last 24 hours) at 01/15/2018 1110 Last data filed at 01/15/2018 0900 Gross per 24 hour  Intake 502 ml  Output -  Net 502 ml     Physical Exam: Vital Signs Blood pressure (!) 174/73, pulse 61, temperature 98 F (36.7 C), temperature source Oral, resp. rate 18, height 5\' 2"  (1.575 m), weight 68 kg, SpO2 91 %. Constitutional: No distress . Vital signs reviewed. HEENT: EOMI, oral membranes moist Neck: supple Cardiovascular: RRR without murmur. No JVD    Respiratory: CTA Bilaterally without wheezes or rales. Normal effort    GI: BS +, non-tender, non-distended  Musc: left shoulder less tender  Neurological:   LUE 3/5 prox to distal. LLE 2+-3/5 prox to distal. Sl altered sensation LUE and LLE.  Improving left inattention Skin: intact. Psych: cooperative    Assessment/Plan: 1. Functional deficits secondary to embolic right MCA infarct which require 3+ hours per day of interdisciplinary therapy in a comprehensive inpatient rehab setting.  Physiatrist is providing close team supervision and 24 hour management of active medical problems listed below.  Physiatrist and rehab team continue to assess barriers to discharge/monitor patient progress toward functional and medical goals  Care Tool:  Bathing  Bathing activity did not occur: Refused(pt declined) Body parts bathed by patient: Right arm, Left arm, Chest, Abdomen,  Front perineal area, Buttocks, Right upper leg, Left upper leg, Right lower leg, Left lower leg, Face   Body parts bathed by helper: Right lower leg, Left lower leg, Buttocks     Bathing assist Assist Level: Contact Guard/Touching assist     Upper Body Dressing/Undressing Upper body dressing Upper body dressing/undressing activity did not occur (including orthotics): (activity did not occur; late admit) What is the patient wearing?: Pull over shirt    Upper body assist Assist Level: Contact Guard/Touching assist    Lower Body Dressing/Undressing Lower body dressing    Lower body dressing activity did not occur: (activity did not occur; late admit) What is the patient wearing?: Underwear/pull up, Pants     Lower body assist Assist for lower body dressing: Contact Guard/Touching assist     Toileting Toileting    Toileting assist Assist for toileting: Contact Guard/Touching assist     Transfers Chair/bed transfer  Transfers assist     Chair/bed transfer assist level: Supervision/Verbal cueing     Locomotion Ambulation   Ambulation assist      Assist level: Supervision/Verbal cueing Assistive device: Walker-rolling Max distance: 100'   Walk 10 feet activity   Assist     Assist level: Supervision/Verbal cueing Assistive device: Walker-rolling   Walk 50 feet activity   Assist    Assist level: Supervision/Verbal cueing Assistive device: Walker-rolling    Walk 150 feet activity   Assist Walk 150 feet activity did not occur: Safety/medical concerns         Walk 10 feet on uneven surface  activity   Assist Walk 10 feet on uneven surfaces activity did not occur:  Safety/medical concerns         Wheelchair     Assist Will patient use wheelchair at discharge?: No   Wheelchair activity did not occur: Safety/medical concerns         Wheelchair 50 feet with 2 turns activity    Assist    Wheelchair 50 feet with 2 turns activity  did not occur: Safety/medical concerns       Wheelchair 150 feet activity     Assist Wheelchair 150 feet activity did not occur: Safety/medical concerns          Medical Problem List and Plan: 1.Left-sided weaknesssecondary to right MCA scattered infarct embolic. Status post partial revascularization of left ICA terminus as well as history of CVA --Continue CIR therapies including PT, OT, and SLP     2. DVT Prophylaxis/Anticoagulation: Eliquis 3. Pain Management:Neurontin 400 mg daily, Fioricet for headaches as needed  -left shoulder, hand/wrist pain likely degnerative  -continue voltaren gel, ice  -left wrist splint  -left shoulder injection 10/25 4. Mood:BuSpar 15 mg twice daily, Zoloft 100 mg daily 5. Neuropsych: This patientiscapable of making decisions on herown behalf. 6. Skin/Wound Care:Routine skin checks 7. Fluids/Electrolytes/Nutrition:encourage PO 8.Hypertension. Lopressor 100 mg twice daily, Cozaar 100 mg daily, Norvasc 10 mg daily  -fairly controlled 10/27 9.Diabetes mellitus with peripheral neuropathy. Hemoglobin A1c 5.7. Glucophage 1000 mg twice daily.   -fair control 10/27    10.COPD with remote tobacco abuse. No shortness of breath reported 11.Hyperlipidemia. Pravachol     LOS: 12 days A FACE TO FACE EVALUATION WAS PERFORMED  Meredith Staggers 01/15/2018, 11:10 AM

## 2018-01-15 NOTE — Progress Notes (Signed)
Occupational Therapy Session Note  Patient Details  Name: Catherine Greene MRN: [id number] Date of Birth: 04/22/1940  Today's Date: 01/15/2018 OT Individual Time: 0930-1015 OT Individual Time Calculation (min): 45 min    Short Term Goals: Week 1:  OT Short Term Goal 1 (Week 1): Pt will don pants EOB with min A OT Short Term Goal 1 - Progress (Week 1): Met OT Short Term Goal 2 (Week 1): Pt will complete toileting tasks with no change in vital signs OT Short Term Goal 2 - Progress (Week 1): Met OT Short Term Goal 3 (Week 1): Pt will transfer to toilet with min A  OT Short Term Goal 3 - Progress (Week 1): Met OT Short Term Goal 4 (Week 1): Pt will don shirt with min A OT Short Term Goal 4 - Progress (Week 1): Progressing toward goal  Skilled Therapeutic Interventions/Progress Updates:    1:1. Pt greeted in bed agreeable to tx. Pt completes stand pivot transfers EOB>w/c<>toilet with supervision. Pt compeltes toileting with supervision/CGA during clothing management. Pt stands 3x while completing no sew blanket for forced BUE coordination and FMC of LUE with MOD VC for incorporation of LUE for stabilizing fabric when cutting fabric. Pt reporting need to have BM. Pt ambulates with supervision dayroom>BSC over toilet in room with supervision to transfer and complete toileting with supervision/increased time to void. Exited session with pt seated in bed, exit alarm on, call light in reach and all needs met  Therapy Documentation Precautions:  Precautions Precautions: Fall Precaution Comments: L hemi Restrictions Weight Bearing Restrictions: No General:     Therapy/Group: Individual Therapy   M  01/15/2018, 9:54 AM 

## 2018-01-15 NOTE — Progress Notes (Signed)
Occupational Therapy Session Note  Patient Details  Name: Emilygrace Grothe MRN: [id number] Date of Birth: 1940-10-17  Today's Date: 01/15/2018 OT Group Time: 1100-1200 OT Group Time Calculation (min): 60 min  Skilled Therapeutic Interventions/Progress Updates:    Pt engaged in therapeutic w/c level dance group focusing on patient choice, UE/LE strengthening, salience, activity tolerance, and social participation. Pt was guided through various dance-based exercises involving UEs/LEs and trunk. All music was selected by group members. Emphasis placed on Lt NMR and dynamic standing balance. Pt stood during several songs to dance with steady assist. She was able to maintain balance with unilateral UE support on back of chair or no UE support while slowly swaying hips to music. She required multiple seated rest breaks due to fatigue and Lt shoulder discomfort. Pt able to communicate well with group member on Lt to take these breaks as needed. At end of session pts spouse escorted her back to room.    Therapy Documentation Precautions:  Precautions Precautions: Fall Precaution Comments: L hemi Restrictions Weight Bearing Restrictions: No Pain: in Lt shoulder with prolonged activity. Subsided with rest and gentle guided stretching.  Pain Assessment Pain Scale: 0-10 Pain Score: 2  Pain Type: Chronic pain Pain Location: Shoulder Pain Orientation: Left Pain Descriptors / Indicators: Aching Pain Frequency: Intermittent Pain Onset: On-going Patients Stated Pain Goal: 0 Pain Intervention(s): Repositioned Multiple Pain Sites: No ADL: ADL Eating: Set up Where Assessed-Eating: Bed level Grooming: Minimal cueing, Supervision/safety Where Assessed-Grooming: Sitting at sink Upper Body Bathing: Minimal assistance, Minimal cueing Where Assessed-Upper Body Bathing: Edge of bed Lower Body Bathing: Moderate assistance, Minimal cueing Where Assessed-Lower Body Bathing: Edge of bed Upper Body Dressing:  Maximal assistance Where Assessed-Upper Body Dressing: Edge of bed Lower Body Dressing: Maximal assistance Where Assessed-Lower Body Dressing: Edge of bed Toileting: Maximal assistance Where Assessed-Toileting: Glass blower/designer: Moderate assistance Toilet Transfer Method: Counselling psychologist: Energy manager: Not assessed Social research officer, government: Not assessed      Therapy/Group: Group Therapy  Darvis Croft A Truman Aceituno 01/15/2018, 12:20 PM

## 2018-01-15 NOTE — Progress Notes (Addendum)
Pt ambulated in hallways with nurse and husband. Pt walked from 4w11 to day room and back to 4w11. Pt tolerated well and stated she enjoyed it.    Catherine Greene

## 2018-01-15 NOTE — Plan of Care (Signed)
  Problem: RH BOWEL ELIMINATION Goal: RH STG MANAGE BOWEL WITH ASSISTANCE Description STG Manage Bowel with mod I Assistance.  Outcome: Progressing   Problem: RH SAFETY Goal: RH STG ADHERE TO SAFETY PRECAUTIONS W/ASSISTANCE/DEVICE Description STG Adhere to Safety Precautions With cues/ reminders Assistance/Device.  Outcome: Progressing   Problem: RH PAIN MANAGEMENT Goal: RH STG PAIN MANAGED AT OR BELOW PT'S PAIN GOAL Description At or below level 4  Outcome: Progressing   Problem: RH KNOWLEDGE DEFICIT Goal: RH STG INCREASE KNOWLEDGE OF DIABETES Description Manage DM with diet, medication and state understanding of need to check CBG routinely with cues/reminders  Outcome: Progressing Goal: RH STG INCREASE KNOWLEDGE OF HYPERTENSION Description  state understanding of how to Manage HTN with medication, and diet  using resources provided with min assist  Outcome: Progressing Goal: RH STG INCREASE KNOWLEGDE OF HYPERLIPIDEMIA Description Pt will be able to state and demo understanding of management of HLD using resources provided with min assist  Outcome: Progressing Goal: RH STG INCREASE KNOWLEDGE OF STROKE PROPHYLAXIS Description Pt will be able to demo understanding of prevention of secondary stroke using resources and min assist  Outcome: Progressing   Problem: Consults Goal: RH STROKE PATIENT EDUCATION Description See Patient Education module for education specifics  Outcome: Progressing   Problem: RH Vision Goal: RH LTG Vision (Specify) Outcome: Progressing

## 2018-01-16 ENCOUNTER — Inpatient Hospital Stay (HOSPITAL_COMMUNITY): Payer: Medicare Other

## 2018-01-16 ENCOUNTER — Inpatient Hospital Stay (HOSPITAL_COMMUNITY): Payer: Medicare Other | Admitting: Speech Pathology

## 2018-01-16 ENCOUNTER — Encounter (HOSPITAL_COMMUNITY): Payer: Medicare Other | Admitting: Psychology

## 2018-01-16 DIAGNOSIS — R52 Pain, unspecified: Secondary | ICD-10-CM

## 2018-01-16 DIAGNOSIS — I69398 Other sequelae of cerebral infarction: Secondary | ICD-10-CM

## 2018-01-16 LAB — GLUCOSE, CAPILLARY
GLUCOSE-CAPILLARY: 133 mg/dL — AB (ref 70–99)
Glucose-Capillary: 131 mg/dL — ABNORMAL HIGH (ref 70–99)
Glucose-Capillary: 147 mg/dL — ABNORMAL HIGH (ref 70–99)
Glucose-Capillary: 133 mg/dL — ABNORMAL HIGH (ref 70–99)

## 2018-01-16 NOTE — Progress Notes (Signed)
[place] PHYSICAL MEDICINE & REHABILITATION PROGRESS NOTE   Subjective/Complaints: Pt up with therapy. No new issues. Left shoulder hanging in there. Left wrist sore. Using walker without brace  ROS: Patient denies fever, rash, sore throat, blurred vision, nausea, vomiting, diarrhea, cough, shortness of breath or chest pain, joint or back pain, headache, or mood change.    Objective:   No results found. No results for input(s): WBC, HGB, HCT, PLT in the last 72 hours. No results for input(s): NA, K, CL, CO2, GLUCOSE, BUN, CREATININE, CALCIUM in the last 72 hours.  Intake/Output Summary (Last 24 hours) at 01/16/2018 0856 Last data filed at 01/15/2018 1858 Gross per 24 hour  Intake 544 ml  Output -  Net 544 ml     Physical Exam: Vital Signs Blood pressure (!) 179/73, pulse 63, temperature 98 F (36.7 C), temperature source Oral, resp. rate 18, height 5\' 2"  (1.575 m), weight 68 kg, SpO2 95 %. Constitutional: No distress . Vital signs reviewed. HEENT: EOMI, oral membranes moist Neck: supple Cardiovascular: RRR without murmur. No JVD    Respiratory: CTA Bilaterally without wheezes or rales. Normal effort    GI: BS +, non-tender, non-distended  Musc: left shoulder less tender  Neurological:   LUE 3/5 prox to distal. LLE 2+-3/5 prox to distal. Sl altered sensation LUE and LLE.  Improving left inattention Skin: intact. Psych: cooperative    Assessment/Plan: 1. Functional deficits secondary to embolic right MCA infarct which require 3+ hours per day of interdisciplinary therapy in a comprehensive inpatient rehab setting.  Physiatrist is providing close team supervision and 24 hour management of active medical problems listed below.  Physiatrist and rehab team continue to assess barriers to discharge/monitor patient progress toward functional and medical goals  Care Tool:  Bathing  Bathing activity did not occur: Refused(pt declined) Body parts bathed by patient: Right  arm, Left arm, Chest, Abdomen, Front perineal area, Buttocks, Right upper leg, Left upper leg, Right lower leg, Left lower leg, Face   Body parts bathed by helper: Right lower leg, Left lower leg, Buttocks     Bathing assist Assist Level: Contact Guard/Touching assist     Upper Body Dressing/Undressing Upper body dressing Upper body dressing/undressing activity did not occur (including orthotics): (activity did not occur; late admit) What is the patient wearing?: Pull over shirt    Upper body assist Assist Level: Contact Guard/Touching assist    Lower Body Dressing/Undressing Lower body dressing    Lower body dressing activity did not occur: (activity did not occur; late admit) What is the patient wearing?: Underwear/pull up, Pants     Lower body assist Assist for lower body dressing: Contact Guard/Touching assist     Toileting Toileting    Toileting assist Assist for toileting: Supervision/Verbal cueing     Transfers Chair/bed transfer  Transfers assist     Chair/bed transfer assist level: Contact Guard/Touching assist     Locomotion Ambulation   Ambulation assist      Assist level: Supervision/Verbal cueing Assistive device: Walker-rolling Max distance: 100'   Walk 10 feet activity   Assist     Assist level: Supervision/Verbal cueing Assistive device: Walker-rolling   Walk 50 feet activity   Assist    Assist level: Supervision/Verbal cueing Assistive device: Walker-rolling    Walk 150 feet activity   Assist Walk 150 feet activity did not occur: Safety/medical concerns         Walk 10 feet on uneven surface  activity   Assist Walk 10  feet on uneven surfaces activity did not occur: Safety/medical concerns         Wheelchair     Assist Will patient use wheelchair at discharge?: No   Wheelchair activity did not occur: Safety/medical concerns         Wheelchair 50 feet with 2 turns activity    Assist     Wheelchair 50 feet with 2 turns activity did not occur: Safety/medical concerns       Wheelchair 150 feet activity     Assist Wheelchair 150 feet activity did not occur: Safety/medical concerns          Medical Problem List and Plan: 1.Left-sided weaknesssecondary to right MCA scattered infarct embolic. Status post partial revascularization of left ICA terminus as well as history of CVA --Continue CIR therapies including PT, OT, and SLP     2. DVT Prophylaxis/Anticoagulation: Eliquis 3. Pain Management:Neurontin 400 mg daily, Fioricet for headaches as needed  -left shoulder, hand/wrist pain likely degnerative  -continue voltaren gel, ice  -left wrist splint---needs to be using when active (perhaps lower profile splint once home)  -left shoulder injection 10/25 4. Mood:BuSpar 15 mg twice daily, Zoloft 100 mg daily 5. Neuropsych: This patientiscapable of making decisions on herown behalf. 6. Skin/Wound Care:Routine skin checks 7. Fluids/Electrolytes/Nutrition:encourage PO 8.Hypertension. Lopressor 100 mg twice daily, Cozaar 100 mg daily, Norvasc 10 mg daily  -fairly controlled 10/28 9.Diabetes mellitus with peripheral neuropathy. Hemoglobin A1c 5.7. Glucophage 1000 mg twice daily.   -fair control 10/28    10.COPD with remote tobacco abuse. No shortness of breath reported 11.Hyperlipidemia. Pravachol     LOS: 13 days A FACE TO FACE EVALUATION WAS PERFORMED  Meredith Staggers 01/16/2018, 8:56 AM

## 2018-01-16 NOTE — Progress Notes (Addendum)
Physical Therapy Session Note  Patient Details  Name: Catherine Greene MRN: [id number] Date of Birth: [birth year]  Today's Date: 01/16/2018 tx 1:  PT Individual Time: 1100-1200 PT Individual Time Calculation (min): 60 min   Short Term Goals:  Week 2:  PT Short Term Goal 1 (Week 2): = LTG  Skilled Therapeutic Interventions/Progress Updates:   Supine > sit with supervision, HOB elevated.  Pt asked if she could try ambulating with her SPC.  Gait with RW over level tile with close supervision x 100' x 2.  Pt needed cues for obstacle negotiation to avoid ambulating into a dead end instead of turning to return to w/c.  neuromuscular re-education via forced use, multimodal cues for trunk activation, LLE use; using KInetron in sitting at 50 cm/sec > standing at 70 cm/secwith bil UE support fading to 0UE support.  Mod assist for wt shifting.pt demonstrated difficulty shifting to L, but improved with practice. Pt demonstrates minimal trunk shortening/lengthening with wt shifting in standing.   Gait training with SPC (pt's personal) over level tile x 25' x 2 with mod assist fading to min assist; mod cues for sequencing.  Seated therapeutic activity to promote trunk flexion /decrease posterior pelvic tilt in sitting.  Pt reported that she owns a RW which was confirmed by family.  She stated that she thinks it is too early to use the [facility] for now.  PT informed her she can continue to use it for training with PT, but will probably not use it at home upon d/c.  Pt does feel that using the RW is causing L shoulder pain.   Pt left resting in w/c with ice on L shoulder, set up for lunch with family.       Therapy Documentation Precautions:  Precautions Precautions: Fall Precaution Comments: L hemi Restrictions Weight Bearing Restrictions: No  Pain: 3/10 L shoulder;  premedicated Pain Assessment Pain Scale: 0-10 Pain Score: 3  Pain Location: Shoulder Pain Orientation: Left Patients Stated Pain  Goal: 1 Pain Intervention(s): Medication (See eMAR);Repositioned   Treatment 2: 1530-1600, 30 min individual tx  pain: "my shoulder feels pretty good right now" unrated.  Pt recommended pt ask for ice pack PRN.   Toilet transfer with close supervision for continent voiding.  Pt performed 3/3 toileting tasks.  Neuro re-ed via sustained stretch and balance challenge with bil forefeet on wedge in standing, x 1 minute with bil UE support.  Therapeutic activity in standing on wedge, folding pillow cases with bil hands x 4 minutes with 1 mild LOB backwards requiring min assist.  Gait training with grocery cart, x 90' with supervision. Pt reported that bil shoulders began to hurt as she used the grocery cart, although she preferred it to the RW.  Pt left resting in recliner with feet elevated, needs at hand and seat alarm set.       Therapy/Group: Individual Therapy  Rahshawn Remo 01/16/2018, 12:17 PM

## 2018-01-16 NOTE — Progress Notes (Signed)
Occupational Therapy Session Note  Patient Details  Name: Catherine Greene MRN: [id number] Date of Birth: February 19, 1941  Today's Date: 01/16/2018 OT Individual Time: 0730-0830 OT Individual Time Calculation (min): 60 min    Short Term Goals: Week 2:  OT Short Term Goal 1 (Week 2): STG=LTG d/t ELOS  Skilled Therapeutic Interventions/Progress Updates:   Pt supine in bed eagerly awaiting therapist. Min cueing and manual assist for donning underwear sitting on standard toilet. Pt with increased SOB and fatigue with distal reaching from seated. Pt able to complete all toileting tasks with (S) only- good improvement in clothing management and in L hand grasp. Pt completed 75 ft of functional mobility before taking seated rest break and then completing final 50 ft with RW. (S) only provided and good L attention during functional mobility. Pt sat edge of mat and completed distal reaching for carryover to LB dressing. Pt returned to room and was left on toilet with RN present.   Therapy Documentation Precautions:  Precautions Precautions: Fall Precaution Comments: L hemi Restrictions Weight Bearing Restrictions: No    Vital Signs: Therapy Vitals Temp: 98 F (36.7 C) Temp Source: Oral Pulse Rate: 63 Resp: 18 BP: (!) 179/73(RN Notified) Patient Position (if appropriate): Lying Oxygen Therapy SpO2: 95 % O2 Device: Room Air Pain: Pain Assessment Pain Scale: 0-10 Pain Score: 0-No pain   Therapy/Group: Individual Therapy  Curtis Sites 01/16/2018, 8:48 AM

## 2018-01-16 NOTE — Consult Note (Signed)
Neuropsychological Consultation   Patient:   Catherine Greene   DOB:   [birth year]  MR Number:  [id number]  Location:  [place] [id number] [place] [address] Dept: [phone] [facility]: [id number]           Date of Service:   01/16/2018  Start Time:   8:30  End Time:   9:30  Provider/Observer:  Ilean Skill, Psy.D.       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  Presented on 12/28/2017 with complaints of left-sided weakness.  Cranial CT scan showed right occipital lobe infarction.  MRI showed scattered foci of acute subacute cortical infarcts involving the right frontal, parietal, temporal and occipital lobes.  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  Patient with residual motor deficits in left hand.  Patient having some adjustment issues with extended hospital course.  Patient also had son die 4 months ago after suffering series of strokes.    Reason for Service:  The patient was referred for neuropsychological consultation due to adjustment and coping issues.  Below is the HPI for the current admission.    Catherine Greene a 77 year old right-handed female with history of CAD, COPD with remote tobacco abuse, diabetes mellitus, 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 Per chart review patient lives with spouse. Mobile home with 5 steps to entry. Presented 12/28/2017 with complaints of left-sided weakness. Cranial CT scan showed right occipital lobe  infarction new since prior exam. No acute anterior circulation infarction. MRI/MRA showed scattered foci of acute subacute cortical infarcts involving the right frontal, parietal, temporal and occipital lobes. 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 Echocardiogram with ejection fraction of 70% no wall motion abnormalities grade 1 diastolic dysfunction. Underwent partial revascularization of left ICA terminus per interventional radiology. TCD bubble study completed clinically insignificant findings of trivial PFO. Neurology follow-up currently maintained on aspirin as well as Eliquis for CVA prophylaxis. Tolerating a regular diet. Therapy evaluations completed with recommendations of physical medicine rehab consult. Patient was admitted for a comprehensive rehab program.  Current Status:  The patient reports that she has had to cope with previous strokes but that this has been her hardest and longest recovery.  She reports that there are times she gets frustrated with extended care but feels much better about the amount of care she is getting vs prior events and care from other hospitals in past.    Behavioral Observation: Catherine Greene  presents as a 77 y.o.-year-old  Caucasian Female who appeared her stated age. her dress was Appropriate and she was Well Groomed and her manners were Appropriate to the situation.  her participation was indicative of Appropriate and Attentive behaviors.  There were any physical disabilities noted.  she displayed an appropriate level of cooperation and motivation.     Interactions:    Active Appropriate  Attention:   within normal limits and attention span and concentration were age appropriate  Memory:   within normal limits; recent and  remote memory intact  Visuo-spatial:  While not directly assessed today, patient reports that she is having some issues with left visual field, consistent with stroke  location.    Speech (Volume):  low  Speech:   normal; normal  Thought Process:  Coherent and Relevant  Though Content:  WNL; not suicidal and not homicidal  Orientation:   person, place, time/date and situation  Judgment:   Fair  Planning:   Fair  Affect:    Appropriate  Mood:    Dysphoric  Insight:   Good  Intelligence:   normal  Medical History:   Past Medical History:  Diagnosis Date  . CAD (coronary artery disease)   . Diabetes mellitus    type 2  . Hyperlipidemia   . Hypertension   . Past heart attack 1998   Psychiatric History:  Patient denies past history of depression, anxiety, or other psychiatric history.  Family Med/Psych History:  Family History  Problem Relation Age of Onset  . Bone cancer Mother 52       died  . Heart failure Father 45       died    Risk of Suicide/Violence: virtually non-existent Patient denies SI or HI.  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  Presented on 12/28/2017 with complaints of left-sided weakness.  Cranial CT scan showed right occipital lobe infarction.  MRI showed scattered foci of acute subacute cortical infarcts involving the right frontal, parietal, temporal and occipital lobes.  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  Patient with residual motor deficits in left hand.  Patient having some adjustment issues with extended hospital course.  Patient also had son die 4 months ago after suffering series of strokes.    The patient reports that she has had to cope with previous strokes but that this has been her hardest and longest recovery.  She reports that there are times she gets frustrated with extended care but feels much better about the amount of care she is getting vs prior  events and care from other hospitals in past.    Diagnosis:    COPD (chronic obstructive pulmonary disease) ([place]) - Plan: DG Chest 2 View, DG Chest 2 View  Pain after cerebrovascular accident (CVA) - Plan: DG Wrist 2 Views Left, DG Wrist 2 Views Left         Electronically Signed   _______________________ Ilean Skill, Psy.D.

## 2018-01-16 NOTE — Progress Notes (Signed)
Speech Language Pathology Daily Session Note  Patient Details  Name: Catherine Greene MRN: [id number] Date of Birth: 1941-02-19  Today's Date: 01/16/2018 SLP Individual Time: 0930-1025 SLP Individual Time Calculation (min): 55 min  Short Term Goals: Week 2: SLP Short Term Goal 1 (Week 2): Continue working towards supervision LTG   Skilled Therapeutic Interventions: Skilled treatment session focused on cognitive goals. SLP facilitated session by providing extra time and supervision verbal cues for problem solving and attention to left field of environment while ambulating the the RW. SLP also facilitated session by providing extra time for completion of mildly complex visual scanning tasks while utilizing the colored PEG board with patient independently compensating for left visual field deficits. Patient left upright in bed with alarm on and all needs within reach. Continue with current plan of care.      Pain No/Denies Pain   Therapy/Group: Individual Therapy  Catherine Greene 01/16/2018, 3:35 PM

## 2018-01-17 ENCOUNTER — Inpatient Hospital Stay (HOSPITAL_COMMUNITY): Payer: Medicare Other | Admitting: Occupational Therapy

## 2018-01-17 ENCOUNTER — Inpatient Hospital Stay (HOSPITAL_COMMUNITY): Payer: Medicare Other | Admitting: Physical Therapy

## 2018-01-17 ENCOUNTER — Inpatient Hospital Stay (HOSPITAL_COMMUNITY): Payer: Medicare Other | Admitting: Speech Pathology

## 2018-01-17 LAB — GLUCOSE, CAPILLARY
GLUCOSE-CAPILLARY: 130 mg/dL — AB (ref 70–99)
Glucose-Capillary: 94 mg/dL (ref 70–99)
Glucose-Capillary: 110 mg/dL — ABNORMAL HIGH (ref 70–99)
Glucose-Capillary: 144 mg/dL — ABNORMAL HIGH (ref 70–99)

## 2018-01-17 MED ORDER — LOSARTAN POTASSIUM 50 MG PO TABS
100.0000 mg | ORAL_TABLET | Freq: Every day | ORAL | Status: DC
  Administered 2018-01-18: 100 mg via ORAL
  Filled 2018-01-17: qty 2

## 2018-01-17 NOTE — Progress Notes (Signed)
Physical Therapy Session Note  Patient Details  Name: Catherine Greene MRN: [id number] Date of Birth: 1941/03/22  Today's Date: 01/17/2018 PT Individual Time: 0930-1025 PT Individual Time Calculation (min): 55 min   Short Term Goals: Week 2:  PT Short Term Goal 1 (Week 2): = LTG  Skilled Therapeutic Interventions/Progress Updates:    Pt received supine in room and agreeable to therapy. Pt performed functional gait training with RW and supervision and SPC with CGA for balance. Pt completed obstacle course of 172ft with cones and objects that had to be stepped over x2. Pt required min VC to step over objects safely with RW. Pt expressed concerns with RW on carpet at home, but was able to safely amb w/ supervision 2x85ft on soft mat to imitate carpet with RW and stated "that wasn't bad." Pt amb 51ft with SPC x4 and 131ft x1. Pt required verbal cues and SPT provided pt education on [facility] use. Initial amb w/ SPC had appropriate use and placement of 40% and by end of session improved to 80%. Pt completed DynoVision x3 in standing with RW for balance and supervision. Pt reaction time for trial 1 of 2.5sec with 2.4sec for the 2nd trial. The 3rd trial the pt stood on foam and reaction time was 2.3sec pt had minor LOB x1 with min A for balance correction. Pt returned to room and stated she needed to use restroom. Pt required supervision for w/c to commode transfer but was able to toilet and don/doff pants I. Pt was left in supine with all needs in reach.   Therapy Documentation Precautions:  Precautions Precautions: Fall Precaution Comments: L hemi Restrictions Weight Bearing Restrictions: No    Pain: Pt reported pain in L shoulder that was decreased when using SPC instead of RW for amb.     Therapy/Group: Individual Therapy  Alyaan Budzynski SPT  01/17/2018, 2:12 PM

## 2018-01-17 NOTE — Progress Notes (Signed)
Speech Language Pathology Daily Session Note  Patient Details  Name: Stephanny Tsutsui MRN: [id number] Date of Birth: [birth year]  Today's Date: 01/17/2018 SLP Individual Time: 0830-0930 SLP Individual Time Calculation (min): 60 min  Short Term Goals: Week 2: SLP Short Term Goal 1 (Week 2): Continue working towards supervision LTG   Skilled Therapeutic Interventions: Skilled treatment session focused on cognitive goals. SLP facilitated session by providing extra time for patient to complete the [place] Visual Cancellation Test-Single Stimuli in which patient located all occurences with Mod I for use of compensatory strategies. SLP also facilitated session by administering the MoCA-version 7.3 in which patient scored 27/30 points with a score of 26 or above considered normal. SLP provided continued education in regards to memory compensatory strategies and utilization at discharge. Patient verbalized understanding and agreement. Patient left upright in wheelchair with all needs within reach. Continue with current plan of care.      Pain No/Denies Pain   Therapy/Group: Individual Therapy  Zyshawn Bohnenkamp 01/17/2018, 12:55 PM

## 2018-01-17 NOTE — Progress Notes (Signed)
Occupational Therapy Session Note  Patient Details  Name: Catherine Greene MRN: [id number] Date of Birth: [birth year]  Today's Date: 01/17/2018 OT Individual Time: 2683-4196 OT Individual Time Calculation (min): 73 min    Short Term Goals: Week 2:  OT Short Term Goal 1 (Week 2): STG=LTG d/t ELOS  Skilled Therapeutic Interventions/Progress Updates:    [name] is pleasant and cooperative, eager to participate in therapy session.  She completed toileting and grooming at sink in stance with RW and CS.  Ambulation to and from therapy gym with RW CS and occ cues for hand placement.  She completed standing and dynamic mobility activities throughout the therapy session with CS - only cues needed when she started to get tired.  We reviewed safety with mobility (sig other was present and included in the conversation).  She is encouraged to take rest breaks upon return home.  Standing tolerance for visual scanning task is 6 minutes x2.  UE reaching activities with focus on posture - good mobility noted but patient started to c/o increased shoulder discomfort so limited OH reach for remainder of session.  Hand, forearm mobility and coordination activities completed with good tolerance and increased extension of digits at end of session.  Patient returned to bed at close of session with alarm set.  She states that she is pleased with her progress.    Therapy Documentation Precautions:  Precautions Precautions: Fall Precaution Comments: L hemi Restrictions Weight Bearing Restrictions: No General:   Vital Signs: Therapy Vitals Temp: 97.8 F (36.6 C) Temp Source: Oral Pulse Rate: 63 Resp: 18 BP: (!) 142/61 Patient Position (if appropriate): Lying Oxygen Therapy SpO2: 94 % O2 Device: Room Air Pain: Pain Assessment Pain Scale: 0-10 Pain Score: 0-No pain Pain Location: Shoulder Pain Orientation: Left Pain Descriptors / Indicators: Aching Patients Stated Pain Goal: 1 Pain Intervention(s):  Medication (See eMAR)   Therapy/Group: Individual Therapy  Carlos Levering 01/17/2018, 2:52 PM

## 2018-01-17 NOTE — Progress Notes (Signed)
[facility] PHYSICAL MEDICINE & REHABILITATION PROGRESS NOTE   Subjective/Complaints: No new complaints. Left shoulder acted up a little bit with walker use  ROS: Patient denies fever, rash, sore throat, blurred vision, nausea, vomiting, diarrhea, cough, shortness of breath or chest pain, joint or back pain, headache, or mood change.    Objective:   No results found. No results for input(s): WBC, HGB, HCT, PLT in the last 72 hours. No results for input(s): NA, K, CL, CO2, GLUCOSE, BUN, CREATININE, CALCIUM in the last 72 hours.  Intake/Output Summary (Last 24 hours) at 01/17/2018 0926 Last data filed at 01/16/2018 1700 Gross per 24 hour  Intake 440 ml  Output -  Net 440 ml     Physical Exam: Vital Signs Blood pressure (!) 170/60, pulse (!) 57, temperature 97.8 F (36.6 C), temperature source Oral, resp. rate 15, height 5\' 2"  (1.575 m), weight 68 kg, SpO2 96 %. Constitutional: No distress . Vital signs reviewed. HEENT: EOMI, oral membranes moist Neck: supple Cardiovascular: RRR without murmur. No JVD    Respiratory: CTA Bilaterally without wheezes or rales. Normal effort    GI: BS +, non-tender, non-distended  Musc: left shoulder less tender  Neurological:   LUE 3/5 prox to distal. LLE 2+-3/5 prox to distal. Sl altered sensation LUE and LLE.  Improving left inattention Skin: intact. Psych: cooperative    Assessment/Plan: 1. Functional deficits secondary to embolic right MCA infarct which require 3+ hours per day of interdisciplinary therapy in a comprehensive inpatient rehab setting.  Physiatrist is providing close team supervision and 24 hour management of active medical problems listed below.  Physiatrist and rehab team continue to assess barriers to discharge/monitor patient progress toward functional and medical goals  Care Tool:  Bathing  Bathing activity did not occur: Refused(pt declined) Body parts bathed by patient: Right arm, Left arm, Chest, Abdomen,  Front perineal area, Buttocks, Right upper leg, Left upper leg, Right lower leg, Left lower leg, Face   Body parts bathed by helper: Right lower leg, Left lower leg, Buttocks     Bathing assist Assist Level: Contact Guard/Touching assist     Upper Body Dressing/Undressing Upper body dressing Upper body dressing/undressing activity did not occur (including orthotics): (activity did not occur; late admit) What is the patient wearing?: Pull over shirt    Upper body assist Assist Level: Contact Guard/Touching assist    Lower Body Dressing/Undressing Lower body dressing    Lower body dressing activity did not occur: (activity did not occur; late admit) What is the patient wearing?: Underwear/pull up, Pants     Lower body assist Assist for lower body dressing: Contact Guard/Touching assist     Toileting Toileting    Toileting assist Assist for toileting: Supervision/Verbal cueing     Transfers Chair/bed transfer  Transfers assist     Chair/bed transfer assist level: Contact Guard/Touching assist     Locomotion Ambulation   Ambulation assist      Assist level: Supervision/Verbal cueing Assistive device: Walker-rolling Max distance: 100   Walk 10 feet activity   Assist     Assist level: Supervision/Verbal cueing Assistive device: Walker-rolling   Walk 50 feet activity   Assist    Assist level: Supervision/Verbal cueing Assistive device: Walker-rolling    Walk 150 feet activity   Assist Walk 150 feet activity did not occur: Safety/medical concerns         Walk 10 feet on uneven surface  activity   Assist Walk 10 feet on uneven surfaces activity  did not occur: Safety/medical concerns         Wheelchair     Assist Will patient use wheelchair at discharge?: No   Wheelchair activity did not occur: Safety/medical concerns         Wheelchair 50 feet with 2 turns activity    Assist    Wheelchair 50 feet with 2 turns activity  did not occur: Safety/medical concerns       Wheelchair 150 feet activity     Assist Wheelchair 150 feet activity did not occur: Safety/medical concerns          Medical Problem List and Plan: 1.Left-sided weaknesssecondary to right MCA scattered infarct embolic. Status post partial revascularization of left ICA terminus as well as history of CVA --Continue CIR therapies including PT, OT, and SLP    -team conference today   2. DVT Prophylaxis/Anticoagulation: Eliquis 3. Pain Management:Neurontin 400 mg daily, Fioricet for headaches as needed  -left shoulder, hand/wrist pain likely degnerative  -continue voltaren gel, ice  -left wrist splint---needs to be using when active (perhaps lower profile splint once home)  -left shoulder injection 10/25 with positive results. Needs to be aware of how she is using the arm with activities 4. Mood:BuSpar 15 mg twice daily, Zoloft 100 mg daily 5. Neuropsych: This patientiscapable of making decisions on herown behalf. 6. Skin/Wound Care:Routine skin checks 7. Fluids/Electrolytes/Nutrition:encourage PO 8.Hypertension. Lopressor 100 mg twice daily, Cozaar 100 mg daily, Norvasc 10 mg daily  -fairly controlled 10/29 except for AM numbers.   -change cozaar to 8pm nightly 9.Diabetes mellitus with peripheral neuropathy. Hemoglobin A1c 5.7. Glucophage 1000 mg twice daily.   -fair control 10/29    10.COPD with remote tobacco abuse. No shortness of breath reported 11.Hyperlipidemia. Pravachol     LOS: 14 days A FACE TO FACE EVALUATION WAS PERFORMED  Meredith Staggers 01/17/2018, 9:26 AM

## 2018-01-18 ENCOUNTER — Inpatient Hospital Stay (HOSPITAL_COMMUNITY): Payer: Medicare Other

## 2018-01-18 ENCOUNTER — Inpatient Hospital Stay (HOSPITAL_COMMUNITY): Payer: Medicare Other | Admitting: Speech Pathology

## 2018-01-18 LAB — GLUCOSE, CAPILLARY
GLUCOSE-CAPILLARY: 122 mg/dL — AB (ref 70–99)
Glucose-Capillary: 108 mg/dL — ABNORMAL HIGH (ref 70–99)
Glucose-Capillary: 144 mg/dL — ABNORMAL HIGH (ref 70–99)

## 2018-01-18 NOTE — Discharge Summary (Signed)
NAMEKEEGAN, BENSCH MEDICAL RECORD [id number] ACCOUNT [id number] DATE OF BIRTH:01-16-1941 FACILITY: [facility] LOCATION: [facility] PHYSICIAN:ZACHARY Naaman Plummer, MD  DISCHARGE SUMMARY  DATE OF DISCHARGE:  01/19/2018  DISCHARGE DIAGNOSES:    1.  Right middle cerebral artery infarction, scattered infarct - embolic. 2.  Deep venous thrombosis prophylaxis with Eliquis. 3.  Pain management. 4.  Mood. 5.  Hypertension. 6.  Diabetes mellitus. 7.  Peripheral neuropathy.   8.  Chronic obstructive pulmonary disease with remote tobacco abuse. 9.  Hyperlipidemia.  HOSPITAL COURSE::  A 77 year old right-handed female with history of CAD, COPD, diabetes mellitus, CVA with residual weakness, bilateral lower extremities, used a cane prior to admission, pulmonary emboli, maintained on Eliquis.  Lives with spouse,  mobile home, 5 steps to entry.  Presented 12/28/2017 with complaints of left-sided weakness.    Cranial CT scan showed right occipital lobe infarction, new since prior examination.  No acute anterior circulation infarction.  MRI/MRA showed scattered foci of acute subacute cortical infarctions involving the right frontoparietal, temporal and  occipital lobes.  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  Echocardiogram with ejection fraction 70%, no wall motion abnormality, grade I diastolic dysfunction.    Underwent partial revascularization per interventional radiology of left ICA terminus.  TCD bubble study completed, clinically insignificant findings of trivial PFO.  Maintained on combination aspirin and Eliquis for CVA prophylaxis.  Therapy evaluations  completed and the patient was admitted for comprehensive rehabilitation program.  PAST MEDICAL HISTORY:  See discharge diagnoses.  SOCIAL HISTORY:  Lives with spouse.  mobile home, 5 steps to entry.  FUNCTIONAL STATUS:  Upon admission to rehab services, minimal assist with stand /pivot transfers,  moderate assist 20 feet to ambulate, minute/mod assist with activities of daily living.  PHYSICAL EXAMINATION: VITAL SIGNS:  Blood pressure 152/52, pulse 84, temperature 98, respirations 18. GENERAL:  Alert female.  Mood was flat, but appropriate.  Fair insight to deficits, right gaze preference.  Pupils are round and reactive to light. NECK:  Supple, nontender, no JVD. CARDIOVASCULAR:  Rate controlled. ABDOMEN:  Soft, nontender, good bowel sounds. LUNGS:  Clear to auscultation without wheeze.  REHABILITATION HOSPITAL COURSE:  The patient was admitted to inpatient rehabilitation services.  Therapies initiated on a 3-hour daily basis, consisting of physical therapy, occupational therapy, speech therapy and rehabilitation nursing.  The following  issues were addressed during patient's rehabilitation stay:    Pertaining to the patient's right artery infarction.  She remained on combination aspirin and Eliquis.  She would follow up with neurology services.  Noted history of pulmonary emboli.  No bleeding episodes.  No shortness of breath.  Again, she continued  on Eliquis.  Pain management with the use of Neurontin.    She was using Fioricet for intermittent bouts of headache.  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  Mood stabilization with BuSpar and Zoloft as prior to admission.  She was attending full therapies.    Blood pressure is controlled on Norvasc as well as Cozaar with Lopressor.  No orthostatic changes.  She would follow up with primary MD.    Hemoglobin A1c of 5.7 Glucophage as advised.  Diabetic teaching completed.    She did have a history of COPD, remote tobacco abuse.  No shortness of breath reported.    She remained on Pravachol for hyperlipidemia.  The patient received weekly collaborative interdisciplinary team conferences to discuss estimated length of stay, family teaching, any barriers to discharge.  Performed functional gait  training, rolling walker,  supervision.  Straight point cane, contact  guard assist for balance, ambulating 110 feet around obstacles.  She was able to ambulate on carpeted surfaces.  Required some verbal cues at times for safety.  Gathered belongings for activities of daily living and homemaking completed toileting,  grooming at sink side.  She was able to communicate her needs.  Tolerating a regular consistency diet.  Family teaching completed and discharged to home.  DISCHARGE MEDICATIONS:  Included Norvasc 10 mg p.o. daily, Eliquis 5 mg p.o. b.i.d., aspirin 81 mg p.o. daily, BuSpar 15 mg p.o. b.i.d., Voltaren gel 2 grams 4 times a day to affected area, Neurontin 400 mg p.o. daily, Cozaar 100 mg p.o. daily,  Glucophage 1000 mg p.o. b.i.d., Lopressor 100 mg p.o. b.i.d., Pravachol 80 mg p.o. daily, Zoloft 100 mg p.o. daily, Fioricet as needed for headaches  DIET:  Diabetic diet.    FOLLOWUP:  She would follow up with Dr. Alger Simons at the outpatient rehab service office as directed; Dr. Antony Contras, call for appointment; Dr. Dora Sims,  Interventional radiology, Dr. Alean Rinne, medical management of [place], [place].  SPECIAL INSTRUCTIONS:  No driving, smoking or alcohol.  AN/NUANCE D:01/18/2018 T:01/18/2018 JOB:[id number]

## 2018-01-18 NOTE — Progress Notes (Signed)
Speech Language Pathology Discharge Summary  Patient Details  Name: Catherine Greene MRN: [id number] Date of Birth: 1940-07-25  Today's Date: 01/18/2018 SLP Individual Time: 1300-1400 SLP Individual Time Calculation (min): 60 min   Skilled Therapeutic Interventions:  Skilled treatment session focused on cognitive goals. SLP facilitated session by providing extra time and supervision verbal cues during a mildly complex deductive reasoning task. Patient also required supervision verbal cues for use of compensatory strategies for visual scanning during a mildly complex scanning task. Patient's significant other present during session and reported patient is at her baseline level of cognitive functioning. However, SLP provided education on importance of allowing patient to be as independent as possible and for utilization of memory compensatory strategies at discharge. He verbalized understanding and agreement. Patient transferred back to bed at end of session with supervision. Patient left in bed with alarm on and all needs within reach. Continue with current plan of care.   Patient has met 4 of 4 long term goals.  Patient to discharge at overall Supervision;Modified Independent level.   Reasons goals not met: N/A   Clinical Impression/Discharge Summary: Patient has made excellent gains and has met 4 of 4 LTGs this reporting period. Currently, patient requires overall supervision verbal cues-Mod I to perform basic and familiar tasks safely in regards to problem solving, recall and use of compensatory strategies for visual scanning.  Suspect patient is at her cognitive baseline due to h/o baseline memory deficits from previous stroke, therefore, skilled SLP f/u is not warranted at this time. Patient education is complete and patient will discharge home with 24 hour supervision from family.   Care Partner:  Caregiver Able to Provide Assistance: Yes  Type of Caregiver Assistance:  Physical;Cognitive  Recommendation:  None;24 hour supervision/assistance      Equipment: N/A   Reasons for discharge: Treatment goals met;Discharged from hospital   Patient/Family Agrees with Progress Made and Goals Achieved: Yes    [place], [place] 01/18/2018, 3:43 PM

## 2018-01-18 NOTE — Progress Notes (Signed)
Catherine Greene PHYSICAL MEDICINE & REHABILITATION PROGRESS NOTE   Subjective/Complaints: Overall feeling well. Left shoulder bothers her when she uses her walker. Otherwise it's not too bad.   ROS: Patient denies fever, rash, sore throat, blurred vision, nausea, vomiting, diarrhea, cough, shortness of breath or chest pain, joint or back pain, headache, or mood change.    Objective:   No results found. No results for input(s): WBC, HGB, HCT, PLT in the last 72 hours. No results for input(s): NA, K, CL, CO2, GLUCOSE, BUN, CREATININE, CALCIUM in the last 72 hours.  Intake/Output Summary (Last 24 hours) at 01/18/2018 0835 Last data filed at 01/18/2018 0700 Gross per 24 hour  Intake 720 ml  Output -  Net 720 ml     Physical Exam: Vital Signs Blood pressure (!) 154/69, pulse 62, temperature 98.2 F (36.8 C), temperature source Oral, resp. rate 19, height 5\' 2"  (1.575 m), weight 68.2 kg, SpO2 92 %. Constitutional: No distress . Vital signs reviewed. HEENT: EOMI, oral membranes moist Neck: supple Cardiovascular: RRR without murmur. No JVD    Respiratory: CTA Bilaterally without wheezes or rales. Normal effort    GI: BS +, non-tender, non-distended  Musc: left shoulder less tender  Neurological:   LUE 3/5 prox to distal. LLE 2+-3/5 prox to distal. Sl altered sensation LUE and LLE.  Improved left inattention. Engages nicely on left Skin: intact. Psych: cooperative    Assessment/Plan: 1. Functional deficits secondary to embolic right MCA infarct which require 3+ hours per day of interdisciplinary therapy in a comprehensive inpatient rehab setting.  Physiatrist is providing close team supervision and 24 hour management of active medical problems listed below.  Physiatrist and rehab team continue to assess barriers to discharge/monitor patient progress toward functional and medical goals  Care Tool:  Bathing  Bathing activity did not occur: Refused(pt declined) Body parts bathed  by patient: Right arm, Left arm, Chest, Abdomen, Front perineal area, Buttocks, Right upper leg, Left upper leg, Right lower leg, Left lower leg, Face   Body parts bathed by helper: Right lower leg, Left lower leg, Buttocks     Bathing assist Assist Level: Contact Guard/Touching assist     Upper Body Dressing/Undressing Upper body dressing Upper body dressing/undressing activity did not occur (including orthotics): (activity did not occur; late admit) What is the patient wearing?: Pull over shirt    Upper body assist Assist Level: Contact Guard/Touching assist    Lower Body Dressing/Undressing Lower body dressing    Lower body dressing activity did not occur: (activity did not occur; late admit) What is the patient wearing?: Underwear/pull up, Pants     Lower body assist Assist for lower body dressing: Contact Guard/Touching assist     Toileting Toileting    Toileting assist Assist for toileting: Supervision/Verbal cueing     Transfers Chair/bed transfer  Transfers assist     Chair/bed transfer assist level: Supervision/Verbal cueing     Locomotion Ambulation   Ambulation assist      Assist level: Supervision/Verbal cueing Assistive device: Walker-rolling Max distance: 110   Walk 10 feet activity   Assist     Assist level: Supervision/Verbal cueing Assistive device: Walker-rolling   Walk 50 feet activity   Assist    Assist level: Supervision/Verbal cueing Assistive device: Walker-rolling    Walk 150 feet activity   Assist Walk 150 feet activity did not occur: Safety/medical concerns         Walk 10 feet on uneven surface  activity   Assist  Walk 10 feet on uneven surfaces activity did not occur: Safety/medical concerns         Wheelchair     Assist Will patient use wheelchair at discharge?: No   Wheelchair activity did not occur: Safety/medical concerns         Wheelchair 50 feet with 2 turns  activity    Assist    Wheelchair 50 feet with 2 turns activity did not occur: Safety/medical concerns       Wheelchair 150 feet activity     Assist Wheelchair 150 feet activity did not occur: Safety/medical concerns          Medical Problem List and Plan: 1.Left-sided weaknesssecondary to right MCA scattered infarct embolic. Status post partial revascularization of left ICA terminus as well as history of CVA --Continue CIR therapies including PT, OT, and SLP    -dc 10/31   2. DVT Prophylaxis/Anticoagulation: Eliquis 3. Pain Management:Neurontin 400 mg daily, Fioricet for headaches as needed  -left shoulder, hand/wrist pain likely degnerative  -continue voltaren gel, ice  -left wrist splint---needs to be using when active (perhaps lower profile splint once home)  -left shoulder injection 10/25 with positive results. Needs to be aware of how she is using the arm with activities  -would she benefit from lowering the walker 1-2"? 4. Mood:BuSpar 15 mg twice daily, Zoloft 100 mg daily 5. Neuropsych: This patientiscapable of making decisions on herown behalf. 6. Skin/Wound Care:Routine skin checks 7. Fluids/Electrolytes/Nutrition:encourage PO 8.Hypertension. Lopressor 100 mg twice daily, Cozaar 100 mg daily, Norvasc 10 mg daily       -after changing cozaar to 8pm nightly, bp improved this morning  -may need outpt adjustment 9.Diabetes mellitus with peripheral neuropathy. Hemoglobin A1c 5.7. Glucophage 1000 mg twice daily.   -fair control 10/30    10.COPD with remote tobacco abuse. No shortness of breath reported 11.Hyperlipidemia. Pravachol     LOS: 15 days A FACE TO FACE EVALUATION WAS PERFORMED  Meredith Staggers 01/18/2018, 8:35 AM

## 2018-01-18 NOTE — Progress Notes (Signed)
Social Work Patient ID: Catherine Greene, female   DOB: 1940/04/19, 77 y.o.   MRN: [id number]   CSW met with pt, pt's dtr, and pt's significant other 01-17-18 to update them on team conference discussion.  Pt is pleased to be going home 01-19-18 and family feels prepared to take pt home.  CSW will arrange [facility] and pt has all recommended DME.  Spiritual care is coming to help pt complete her advanced directives.  CSW will continue to follow and assist as needed.

## 2018-01-18 NOTE — Progress Notes (Signed)
Physical Therapy Discharge Summary  Patient Details  Name: Catherine Greene MRN: [id number] Date of Birth: 19-Jan-1941  Today's Date: 01/18/2018 tx 1:  PT Individual Time: 4098-1191 PT Individual Time Calculation (min): 20mn    Patient has met 10 of 11 long term goals due to improved activity tolerance, improved balance, improved postural control, increased strength, ability to compensate for deficits, functional use of  left upper extremity and left lower extremity, improved attention and improved awareness.  Patient to discharge at an ambulatory level Supervision.   Patient's care partner is used to supervising pt for cognitive deficts and helping her on stairs.  to provide the necessary physical and cognitive assistance at discharge.  Reasons goals not met: pt has LOB during simulated car transfer  Recommendation:  Patient will benefit from ongoing skilled PT services in home health setting to continue to advance safe functional mobility, address ongoing impairments in balance, activity tolerance, motor control, and minimize fall risk.  Pt would benefit from learning self ROM bil ankle in standing, with cueing from family, to address tight heel cords affecting her balance in addition to multisystem deficits.  Equipment: No equipment provided; [facility] Reasons for discharge: treatment goals met and discharge from hospital  Patient/family agrees with progress made and goals achieved: Yes  PT Discharge  tx 1:  bed> bathroom with RW for continent voiding.  Gait room> gym with RW, supervision.  Simulated car transfer with mild LOB upon standing.  Assessment of balance strategies: given external perturbations in standing, pt demonstrated limited bil ankle strategy, limited L stepping strategy, absent bil hip strategy.  Limited hip strategy emerged with practice.  Pt tired but agreeable to staying up in w/c for lunch.  All needs left at hand and PT reviewed falls precautions and use  of call bell; pt verbalized understanding.   tx 24:[id number] 40 min individual tx  Bed> w/c with supervision.  Gait up/down ramp, over mulched area and up/down 4 steps 2 rails.  Patient demonstrates increased fall risk as noted by score of 33/56 on Berg Balance Scale.  (<36= high risk for falls, close to 100%; 37-45 significant >80%; 46-51 moderate >50%; 52-55 lower >25%).  Implications of score discussed with pt.  Discussed falls recovery and use of 911.  Pt tired and asked to return to bed.  Bed alarm set and needs left at hand.    Precautions/Restrictions- falls; L shoulder painful   Vital Signs Therapy Vitals Temp: 98.5 F (36.9 C) Temp Source: Oral Pulse Rate: 66 Resp: 18 BP: (!) 124/52 Patient Position (if appropriate): Lying Oxygen Therapy SpO2: 93 % O2 Device: Room Air Pain Pain Assessment Pain Score: 2  L shoulder; AM and PM tx; declined meds or ice Vision/Perception  Vision - Assessment Eye Alignment: Within Functional Limits Ocular Range of Motion: Within Functional Limits Alignment/Gaze Preference: Within Defined Limits Tracking/Visual Pursuits: Decreased smoothness of horizontal tracking;Decreased smoothness of vertical tracking Saccades: Decreased speed of saccadic movement Diplopia Assessment: Other (comment)  Cognition Overall Cognitive Status: History of cognitive impairments - at baseline Arousal/Alertness: Awake/alert Orientation Level: Oriented X4 Attention: Selective Sustained Attention: Appears intact Selective Attention: Appears intact Memory: Impaired Memory Impairment: Decreased short term memory;Decreased recall of new information Awareness: Appears intact Awareness Impairment: Emergent impairment;Anticipatory impairment Problem Solving: Appears intact Problem Solving Impairment: Functional basic Executive Function: Reasoning;Sequencing Reasoning: Appears intact Reasoning Impairment: Verbal basic Sequencing: Appears intact Sequencing  Impairment: Verbal complex Safety/Judgment: Appears intact Sensation Sensation Light Touch: Appears Intact Coordination Gross  Motor Movements are Fluid and Coordinated: No Fine Motor Movements are Fluid and Coordinated: No Coordination and Movement Description: generalized weakness Heel Shin Test: decreased excursion bilLE Motor  Motor Motor: Hemiplegia;Other (comment) Motor - Skilled Clinical Observations: generalized weakness and L hemi  Mobility Bed Mobility Bed Mobility: Rolling Right;Rolling Left;Supine to Sit;Sit to Supine Rolling Right: Supervision/verbal cueing Rolling Left: Supervision/Verbal cueing Supine to Sit: Supervision/Verbal cueing Sit to Supine: Supervision/Verbal cueing Transfers Transfers: Sit to Stand;Stand to Sit;Stand Pivot Transfers Sit to Stand: Supervision/Verbal cueing Stand to Sit: Supervision/Verbal cueing Stand Pivot Transfers: Supervision/Verbal cueing Transfer (Assistive device): Rolling walker Locomotion  Gait Ambulation: Yes Gait Assistance: Supervision/Verbal cueing Gait Distance (Feet): 150 Feet Assistive device: Rolling walker Gait Assistance Details: Visual cues for safe use of DME/AE;Verbal cues for precautions/safety Gait Gait: Yes Gait Pattern: Impaired Gait Pattern: Decreased trunk rotation;Narrow base of support;Decreased hip/knee flexion - left;Decreased hip/knee flexion - right Gait velocity: wiht urging to increase speed: 1.64'/sec; neighborhood ambulator High Level Ambulation High Level Ambulation: Side stepping Side Stepping: min assist Stairs / Additional Locomotion Stairs: Yes Stairs Assistance: Minimal Assistance - Patient > 75% Stair Management Technique: Two rails Number of Stairs: 4 Height of Stairs: 6 Ramp: Minimal Assistance - Patient >75% Wheelchair Mobility Wheelchair Mobility: No  Trunk/Postural Assessment  Cervical Assessment Cervical Assessment: Exceptions to WFL(forward head) Thoracic  Assessment Thoracic Assessment: Exceptions to WFL(kyphotic) Lumbar Assessment Lumbar Assessment: Exceptions to WFL(posterior pelvic tilt and drifts to L ) Postural Control Postural Control: Deficits on evaluation(delayed trunk righting) Protective Responses: delayed and inadequate  Balance Balance Balance Assessed: Yes(given external perturbations, pt demonstrates bil ankle and L stepping strategy, inadeqauate; absent hip strategies ) Standardized Balance Assessment Standardized Balance Assessment: Berg Balance Test Berg Balance Test Sit to Stand: Able to stand  independently using hands Standing Unsupported: Able to stand 2 minutes with supervision Sitting with Back Unsupported but Feet Supported on Floor or Stool: Able to sit safely and securely 2 minutes Stand to Sit: Sits safely with minimal use of hands Transfers: Able to transfer with verbal cueing and /or supervision Standing Unsupported with Eyes Closed: Able to stand 10 seconds with supervision Standing Ubsupported with Feet Together: Able to place feet together independently but unable to hold for 30 seconds From Standing, Reach Forward with Outstretched Arm: Reaches forward but needs supervision From Standing Position, Pick up Object from Floor: Able to pick up shoe, needs supervision From Standing Position, Turn to Look Behind Over each Shoulder: Looks behind one side only/other side shows less weight shift Turn 360 Degrees: Needs close supervision or verbal cueing Standing Unsupported, Alternately Place Feet on Step/Stool: Able to complete >2 steps/needs minimal assist Standing Unsupported, One Foot in Front: Able to take small step independently and hold 30 seconds Standing on One Leg: Tries to lift leg/unable to hold 3 seconds but remains standing independently Total Score: 33 Static Sitting Balance Static Sitting - Balance Support: No upper extremity supported;Feet unsupported Static Sitting - Level of Assistance: 6:  Modified independent (Device/Increase time) Dynamic Sitting Balance Dynamic Sitting - Balance Support: Feet unsupported Dynamic Sitting - Level of Assistance: 5: Stand by assistance Dynamic Sitting - Balance Activities: Reaching for objects;Forward lean/weight shifting Sitting balance - Comments: limited trunk reactions Static Standing Balance Static Standing - Balance Support: Bilateral upper extremity supported;During functional activity Static Standing - Level of Assistance: 5: Stand by assistance Dynamic Standing Balance Dynamic Standing - Balance Support: During functional activity;Bilateral upper extremity supported Dynamic Standing - Level of Assistance: 5: Stand by assistance  Dynamic Standing - Balance Activities: Reaching for objects Extremity Assessment      RLE Assessment RLE Assessment: Within Functional Limits Passive Range of Motion (PROM) Comments: 0 degrees ankle DF LLE Assessment LLE Assessment: Exceptions to [facility] Passive Range of Motion (PROM) Comments: 0 degrees ankle DF General Strength Comments: grossly in sitting: 4+.5 hip; 5/5 knee ext; 4/5 hamstrings    Kalen Ratajczak 01/18/2018, 5:21 PM

## 2018-01-18 NOTE — Progress Notes (Signed)
Pt has advanced directives finished and awaiting spiritual services. Called and notary on staff from 9 to 5. Will advise in morning meeting and try to assist with paperwork while still in hospital. Advised that if unable to do in hospital that can take to any notary and have them done.

## 2018-01-18 NOTE — Patient Care Conference (Addendum)
Inpatient RehabilitationTeam Conference and Plan of Care Update Date: 01/17/2018   Time: 2:30 PM    Patient Name: Catherine Greene      Medical Record Number: [id number]  Date of Birth: 1940-04-19 Sex: Female         Room/Bed: 4W11C/4W11C-01 Payor Info: Payor: [place] / Plan: [facility] MEDICARE / Product Type: *No Product type* /    Admitting Diagnosis: CVA  Admit Date/Time:  01/03/2018  4:11 PM Admission Comments: No comment available   Primary Diagnosis:  Right middle cerebral artery stroke [facility]) Principal Problem: Right middle cerebral artery stroke [facility])  Patient Active Problem List   Diagnosis Date Noted  . Pain after cerebrovascular accident (CVA)   . Rotator cuff syndrome, left   . Chronic pulmonary embolism ([place]) 01/03/2018  . Carotid stenosis, right 01/03/2018  . Right middle cerebral artery stroke ([place]) 01/03/2018  . Acute ischemic stroke ([place]) 12/28/2017  . B12 deficiency 10/12/2016  . Procedure refused 10/12/2016  . Dysthymia 08/31/2016  . Controlled substance agreement signed 05/03/2016  . Chronic right shoulder pain 02/02/2016  . Abscess of face 09/11/2015  . Nausea 09/11/2015  . Ischemic stroke ([place]) 08/30/2015  . Acute maxillary sinusitis 04/02/2015  . Chronic obstructive pulmonary disease ([place]) 03/04/2015  . Fatigue 02/20/2015  . Hypoxia 02/20/2015  . Left shoulder pain 10/21/2014  . Breast cancer screening, high risk patient 06/20/2014  . Polyneuropathy associated with underlying disease ([place]) 06/20/2014  . Back pain 11/15/2013  . Myalgia 11/15/2013  . Proteinuria 11/15/2013  . Anxiety 07/02/2013  . Colonoscopy refused 05/21/2013  . Diarrhea 05/21/2013  . Weight loss 05/21/2013  . 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 Carotid occlusion, left 07/04/2012  . Peripheral vascular disease ([place]) 07/04/2012  . Scoliosis (and kyphoscoliosis), idiopathic 07/04/2012  . Spondylolisthesis, congenital 07/04/2012  . Hyperlipidemia  07/04/2012  . Type II diabetes mellitus ([place]) 07/04/2012  . DM (diabetes mellitus), type 2, uncontrolled, periph vascular complic ([place]) 71/08/2692  . HYPERLIPIDEMIA-MIXED 03/13/2010  . Essential hypertension 03/13/2010  . CAD (coronary artery disease), native coronary artery 03/13/2010    Expected Discharge Date: Expected Discharge Date: 01/19/18  Team Members Present: Physician leading conference: Dr. Alger Simons Social Worker Present: Alfonse Alpers, LCSW Nurse Present: Heather Roberts, RN PT Present: Roderic Ovens, PT OT Present: Simonne Come, OT SLP Present: Weston Anna, SLP Other (Discipline and Name): Gunnar Fusi, SLP PPS Coordinator present : Daiva Nakayama, RN, CRRN     Current Status/Progress Goal Weekly Team Focus  Medical   making progress towards goals. bp a little labile. left shoulder better after injection. weraing left wrist splint  stabilize medically for dc  pain, bp control, dm control   Bowel/Bladder   continent of bowel & bladder, LBM 01/16/18  remain continent of bowel and bladder, maintain regular bowel pattern  continue to monitor & assist as needed   Swallow/Nutrition/ Hydration             ADL's   (S) transfers, CGA/occasional min A LB dressing  (S) ADLS and transfers  Distal reaching for LB dressing, B UE coordination, L UE NMR/pain management   Mobility   supervision overall  supervision overall  d/c planning   Communication             Safety/Cognition/ Behavioral Observations  Supervision  Supervision  Education    Pain   c/o pain to left shoulder & hand/wrist, has percocet Q6 prn, scheduled voltaren gel & neurontin  pain scale <4/10  assess &  treat as needed   Skin   no skin issues  no new skin issues   assess q shift    Rehab Goals Patient on target to meet rehab goals: Yes Rehab Goals Revised: none *See Care Plan and progress notes for long and short-term goals.     Barriers to Discharge  Current Status/Progress Possible  Resolutions Date Resolved   Physician    Medical stability               Nursing                  PT                    OT                  SLP                SW                Discharge Planning/Teaching Needs:  Pt to return to her home she shares with her significant other and dtr, Juliann Pulse.  Pt with supervision level goals.  Family is regularly and has been through therapy with pt.   Team Discussion:  Dr. Naaman Plummer injected pt's shoulder and he recommends pt wearing wrist splint when doing any tasks.  Pt is ready to go, medically.  Pt is continent and ready to go, per nursing, but is having daily diarrhea.  Pt's visual scanning has improved and her balance has increased, as well.  Pt will meet PT supervision level goals by d/c.  Pt has supervision to contact guard goals for OT tasks.  Revisions to Treatment Plan:  none    Continued Need for Acute Rehabilitation Level of Care: The patient requires daily medical management by a physician with specialized training in physical medicine and rehabilitation for the following conditions: Daily direction of a multidisciplinary physical rehabilitation program to ensure safe treatment while eliciting the highest outcome that is of practical value to the patient.: Yes Daily medical management of patient stability for increased activity during participation in an intensive rehabilitation regime.: Yes Daily analysis of laboratory values and/or radiology reports with any subsequent need for medication adjustment of medical intervention for : Blood pressure problems;Neurological problems   I attest that I was present, lead the team conference, and concur with the assessment and plan of the team.   Jagjit Riner, Silvestre Mesi 01/19/2018, 10:21 AM

## 2018-01-18 NOTE — Discharge Summary (Signed)
Discharge summary job [id number]

## 2018-01-18 NOTE — Progress Notes (Signed)
Occupational Therapy Discharge Summary  Patient Details  Name: Catherine Greene MRN: [id number] Date of Birth: 06-20-1940  Today's Date: 01/18/2018 OT Individual Time: 0930-1030 OT Individual Time Calculation (min): 60 min    Patient has met 11 of 11 long term goals due to improved activity tolerance, improved balance, postural control, ability to compensate for deficits, functional use of  LEFT upper extremity, improved attention, improved awareness and improved coordination.  Patient to discharge at overall Supervision level.  Patient's care partner is independent to provide the necessary physical assistance at discharge.    Reasons goals not met: All goals met  Recommendation:  Patient will benefit from ongoing skilled OT services in home health setting to continue to advance functional skills in the area of BADL and Reduce care partner burden.  Equipment: No equipment provided  Reasons for discharge: treatment goals met and discharge from hospital  Patient/family agrees with progress made and goals achieved: Yes   Skilled OT Intervention: Session focused on d/c planning, education, and b/d tasks. Pt completed full b/d routine at shower level with (S) only. Pt initially unable to don R sock but when cued to prop foot up, pt able to don with (S) only. Pt completed functional mobility throughout session with cane at CGA level and with RW at (S) level. Discussed home activity program with pt and maintaining current levels of mobility. Pt was left supine in bed with all needs met and bed alarm set.   OT Discharge Precautions/Restrictions  Precautions Precautions: Fall Precaution Comments: L hemi Restrictions Weight Bearing Restrictions: No  Pain Pain Assessment Pain Scale: 0-10 Pain Score: 2  Pain Location: Shoulder Pain Orientation: Left Pain Intervention(s): Refused ADL ADL Eating: Modified independent Where Assessed-Eating: Chair Grooming: Modified independent Where  Assessed-Grooming: Sitting at sink Upper Body Bathing: Setup Where Assessed-Upper Body Bathing: Shower Lower Body Bathing: Setup Where Assessed-Lower Body Bathing: Shower Upper Body Dressing: Supervision/safety Where Assessed-Upper Body Dressing: Edge of bed Lower Body Dressing: Supervision/safety Where Assessed-Lower Body Dressing: Edge of bed Toileting: Supervision/safety Where Assessed-Toileting: Glass blower/designer: Close supervision Toilet Transfer Method: Counselling psychologist: Energy manager: Not assessed Social research officer, government: Close supervision Social research officer, government Method: Heritage manager: Grab bars, Civil engineer, contracting with back Vision Baseline Vision/History: Wears glasses Wears Glasses: At all times Patient Visual Report: Blurring of vision Vision Assessment?: Yes(per OT) Eye Alignment: Within Functional Limits Ocular Range of Motion: Within Functional Limits Alignment/Gaze Preference: Within Defined Limits Tracking/Visual Pursuits: Decreased smoothness of horizontal tracking;Decreased smoothness of vertical tracking Saccades: Decreased speed of saccadic movement Visual Fields: No apparent deficits Diplopia Assessment: Other (comment) Perception  Perception: Impaired Inattention/Neglect: Other (comment) Praxis Praxis: Intact Cognition Overall Cognitive Status: History of cognitive impairments - at baseline Arousal/Alertness: Awake/alert Orientation Level: Oriented X4 Attention: Selective Sustained Attention: Appears intact Selective Attention: Appears intact Memory: Impaired Memory Impairment: Decreased short term memory;Decreased recall of new information Awareness: Appears intact Awareness Impairment: Emergent impairment;Anticipatory impairment Problem Solving: Appears intact Problem Solving Impairment: Functional basic Executive Function: Reasoning;Sequencing Reasoning: Appears intact Reasoning  Impairment: Verbal basic Sequencing: Appears intact Sequencing Impairment: Verbal complex Safety/Judgment: Appears intact Sensation Sensation Light Touch: Appears Intact Hot/Cold: Appears Intact Proprioception: Appears Intact Stereognosis: Appears Intact Coordination Gross Motor Movements are Fluid and Coordinated: No Fine Motor Movements are Fluid and Coordinated: No Coordination and Movement Description: generalized weakness Finger Nose Finger Test: Slow and delayed, overshooting Heel Shin Test: decreased excursion bilLE Motor  Motor Motor: Hemiplegia;Other (comment) Motor - Skilled Clinical Observations: generalized  weakness and L hemi Mobility  Bed Mobility Bed Mobility: Rolling Right;Rolling Left;Supine to Sit;Sit to Supine Rolling Right: Supervision/verbal cueing Rolling Left: Supervision/Verbal cueing Supine to Sit: Supervision/Verbal cueing Sit to Supine: Supervision/Verbal cueing Transfers Sit to Stand: Supervision/Verbal cueing Stand to Sit: Supervision/Verbal cueing  Trunk/Postural Assessment  Cervical Assessment Cervical Assessment: Exceptions to WFL(forward head) Thoracic Assessment Thoracic Assessment: Exceptions to WFL(kyphotic) Lumbar Assessment Lumbar Assessment: Exceptions to WFL(posterior pelvic tilt and drifts to L ) Postural Control Postural Control: Deficits on evaluation(delayed trunk righting)  Balance Balance Balance Assessed: Yes(given external perturbations, pt demonstrates bil ankle and L stepping strategy, inadeqauate; absent hip strategies ) Static Sitting Balance Static Sitting - Balance Support: No upper extremity supported;Feet unsupported Static Sitting - Level of Assistance: 6: Modified independent (Device/Increase time) Dynamic Sitting Balance Dynamic Sitting - Balance Support: Feet unsupported Dynamic Sitting - Level of Assistance: 5: Stand by assistance Dynamic Sitting - Balance Activities: Reaching for objects;Forward lean/weight  shifting Sitting balance - Comments: limited trunk reactions Static Standing Balance Static Standing - Balance Support: Bilateral upper extremity supported;During functional activity Static Standing - Level of Assistance: 5: Stand by assistance Dynamic Standing Balance Dynamic Standing - Balance Support: During functional activity;Bilateral upper extremity supported Dynamic Standing - Level of Assistance: 5: Stand by assistance Dynamic Standing - Balance Activities: Reaching for objects Dynamic Standing - Comments: during functional mobility Extremity/Trunk Assessment RUE Assessment RUE Assessment: Exceptions to [facility] General Strength Comments: 3+/5 overall,  Grip strength: 35 lbs  LUE Assessment Active Range of Motion (AROM) Comments: Shoulder flex: 110 degrees, elbow WFL General Strength Comments: Grip strength: 15 lbs LUE Body System: Neuro Brunstrum levels for arm and hand: Arm;Hand Brunstrum level for arm: Stage IV Movement is deviating from synergy Brunstrum level for hand: Stage V Independence from basic synergies   Curtis Sites OTR/L 01/18/2018, 12:11 PM

## 2018-01-19 LAB — GLUCOSE, CAPILLARY
GLUCOSE-CAPILLARY: 140 mg/dL — AB (ref 70–99)
Glucose-Capillary: 123 mg/dL — ABNORMAL HIGH (ref 70–99)

## 2018-01-19 MED ORDER — BUSPIRONE HCL 15 MG PO TABS: 15.0000 mg | ORAL_TABLET | Freq: Two times a day (BID) | ORAL | 0 refills | Status: AC

## 2018-01-19 MED ORDER — PRAVASTATIN SODIUM 80 MG PO TABS: 80.0000 mg | ORAL_TABLET | Freq: Every day | ORAL | 1 refills | Status: AC

## 2018-01-19 MED ORDER — ELIQUIS 5 MG PO TABS: 5.0000 mg | ORAL_TABLET | Freq: Two times a day (BID) | ORAL | 1 refills | Status: DC

## 2018-01-19 MED ORDER — MAGNESIUM OXIDE 400 MG PO TABS: 400.0000 mg | ORAL_TABLET | Freq: Every day | ORAL | 11 refills | Status: AC

## 2018-01-19 MED ORDER — ACETAMINOPHEN 325 MG PO TABS: 650.0000 mg | ORAL_TABLET | ORAL | Status: AC | PRN

## 2018-01-19 MED ORDER — RANITIDINE HCL 150 MG PO TABS: 150.0000 mg | ORAL_TABLET | Freq: Two times a day (BID) | ORAL | 0 refills | Status: AC

## 2018-01-19 MED ORDER — CALCIUM CARBONATE ANTACID 500 MG PO CHEW: 1.0000 | CHEWABLE_TABLET | ORAL | Status: AC | PRN

## 2018-01-19 MED ORDER — BUTALBITAL-APAP-CAFFEINE 50-325-40 MG PO TABS: 1.0000 | ORAL_TABLET | Freq: Two times a day (BID) | ORAL | 0 refills | Status: AC | PRN

## 2018-01-19 MED ORDER — METOPROLOL TARTRATE 100 MG PO TABS: 100.0000 mg | ORAL_TABLET | Freq: Two times a day (BID) | ORAL | 1 refills | Status: DC

## 2018-01-19 MED ORDER — SERTRALINE HCL 100 MG PO TABS: 100.0000 mg | ORAL_TABLET | Freq: Every day | ORAL | 0 refills | Status: AC

## 2018-01-19 MED ORDER — GABAPENTIN 400 MG PO CAPS: 400.0000 mg | ORAL_CAPSULE | Freq: Every evening | ORAL | 1 refills | Status: AC

## 2018-01-19 MED ORDER — AMLODIPINE BESYLATE 10 MG PO TABS: 10.0000 mg | ORAL_TABLET | Freq: Every day | ORAL | 1 refills | Status: AC

## 2018-01-19 MED ORDER — DICLOFENAC SODIUM 1 % TD GEL: 2.0000 g | Freq: Four times a day (QID) | TRANSDERMAL | 1 refills | Status: AC

## 2018-01-19 MED ORDER — METFORMIN HCL 1000 MG PO TABS: 1000.0000 mg | ORAL_TABLET | Freq: Two times a day (BID) | ORAL | 0 refills | Status: AC

## 2018-01-19 MED ORDER — LOSARTAN POTASSIUM 100 MG PO TABS: 100.0000 mg | ORAL_TABLET | Freq: Every day | ORAL | 1 refills | Status: AC

## 2018-01-19 NOTE — Plan of Care (Signed)
Pt d/c to home

## 2018-01-19 NOTE — Progress Notes (Signed)
Social Work Discharge Note  The overall goal for the admission was met for:   Discharge location: Yes - home with dtr and significant other  Length of Stay: Yes - 16 days  Discharge activity level: Yes - supervision  Home/community participation: Yes  Services provided included: MD, RD, PT, OT, SLP, RN, Pharmacy, [place]: Medicare and Private Insurance: [facility] Medicare  Follow-up services arranged: Home Health: PT/OT and Patient/Family request agency HH: [place], DME: Pt has all recommended DME at home already.  Comments (or additional information):  Pt's dtr and significant other to provide supervision.  They have been at the bedside and in therapies; they feel comfortable with providing pt supervision.  Advanced Directives completed while pt was on CIR.  Patient/Family verbalized understanding of follow-up arrangements: Yes  Individual responsible for coordination of the follow-up plan: pt and her dtr  Confirmed correct DME delivered: Champagne Paletta, Silvestre Mesi 01/19/2018    Ladell Lea, Silvestre Mesi

## 2018-01-19 NOTE — Progress Notes (Signed)
Advance Directive Completed for Patient and copy provided to Nursing Secretary to put in chart and copies plus original given to patient.   [place] and Notary for Spiritual [facility] notarized the document.  [place], [place] [phone]

## 2018-01-19 NOTE — Progress Notes (Signed)
[place] PHYSICAL MEDICINE & REHABILITATION PROGRESS NOTE   Subjective/Complaints: No new complaints. Anxious to get home. Left shoulder a little sore  ROS: Patient denies fever, rash, sore throat, blurred vision, nausea, vomiting, diarrhea, cough, shortness of breath or chest pain, back pain, headache, or mood change.    Objective:   No results found. No results for input(s): WBC, HGB, HCT, PLT in the last 72 hours. No results for input(s): NA, K, CL, CO2, GLUCOSE, BUN, CREATININE, CALCIUM in the last 72 hours.  Intake/Output Summary (Last 24 hours) at 01/19/2018 0539 Last data filed at 01/19/2018 0817 Gross per 24 hour  Intake 540 ml  Output -  Net 540 ml     Physical Exam: Vital Signs Blood pressure (!) 163/55, pulse 65, temperature 98.9 F (37.2 C), temperature source Oral, resp. rate 18, height 5\' 2"  (1.575 m), weight 68.2 kg, SpO2 92 %.  Constitutional: No distress . Vital signs reviewed. HEENT: EOMI, oral membranes moist Neck: supple Cardiovascular: RRR without murmur. No JVD    Respiratory: CTA Bilaterally without wheezes or rales. Normal effort    GI: BS +, non-tender, non-distended  Musc: left shoulder less tender  Neurological:   LUE 3/5 prox to distal. LLE 2+-3/5 prox to distal. Sl altered sensation LUE and LLE.  Improved left  attention. Engages nicely on left Skin: intact. Psych: cooperative    Assessment/Plan: 1. Functional deficits secondary to embolic right MCA infarct which require 3+ hours per day of interdisciplinary therapy in a comprehensive inpatient rehab setting.  Physiatrist is providing close team supervision and 24 hour management of active medical problems listed below.  Physiatrist and rehab team continue to assess barriers to discharge/monitor patient progress toward functional and medical goals  Care Tool:  Bathing  Bathing activity did not occur: Refused(pt declined) Body parts bathed by patient: Right arm, Left arm, Chest,  Abdomen, Front perineal area, Buttocks, Right upper leg, Left upper leg, Right lower leg, Left lower leg, Face   Body parts bathed by helper: Right lower leg, Left lower leg, Buttocks     Bathing assist Assist Level: Supervision/Verbal cueing     Upper Body Dressing/Undressing Upper body dressing Upper body dressing/undressing activity did not occur (including orthotics): (activity did not occur; late admit) What is the patient wearing?: Pull over shirt    Upper body assist Assist Level: Supervision/Verbal cueing    Lower Body Dressing/Undressing Lower body dressing    Lower body dressing activity did not occur: (activity did not occur; late admit) What is the patient wearing?: Underwear/pull up, Pants     Lower body assist Assist for lower body dressing: Supervision/Verbal cueing     Toileting Toileting    Toileting assist Assist for toileting: Supervision/Verbal cueing     Transfers Chair/bed transfer  Transfers assist     Chair/bed transfer assist level: Supervision/Verbal cueing     Locomotion Ambulation   Ambulation assist      Assist level: Supervision/Verbal cueing Assistive device: Walker-rolling Max distance: 150   Walk 10 feet activity   Assist     Assist level: Supervision/Verbal cueing Assistive device: Walker-rolling   Walk 50 feet activity   Assist    Assist level: Supervision/Verbal cueing Assistive device: Walker-rolling    Walk 150 feet activity   Assist Walk 150 feet activity did not occur: Safety/medical concerns  Assist level: Supervision/Verbal cueing Assistive device: Walker-rolling    Walk 10 feet on uneven surface  activity   Assist Walk 10 feet on uneven surfaces  activity did not occur: Safety/medical concerns   Assist level: Minimal Assistance - Patient > 75% Assistive device: Aeronautical engineer Will patient use wheelchair at discharge?: No   Wheelchair activity did not occur:  Safety/medical concerns         Wheelchair 50 feet with 2 turns activity    Assist    Wheelchair 50 feet with 2 turns activity did not occur: Safety/medical concerns       Wheelchair 150 feet activity     Assist Wheelchair 150 feet activity did not occur: Safety/medical concerns          Medical Problem List and Plan: 1.Left-sided weaknesssecondary to right MCA scattered infarct embolic. Status post partial revascularization of left ICA terminus as well as history of CVA --dc home today  -Patient to see Rehab MD/provider in the office for transitional care encounter in 1-2 weeks.  2. DVT Prophylaxis/Anticoagulation: Eliquis 3. Pain Management:Neurontin 400 mg daily, Fioricet for headaches as needed  -left shoulder, hand/wrist pain likely degnerative  -continue voltaren gel, ice  -left wrist splint---needs to be using when active (perhaps lower profile splint once home)  -left shoulder injection 10/25 with positive results. Needs to be aware of how she is using the arm with activities  -ergonomic modifications to help with left shoulder/wrist at home 4. Mood:BuSpar 15 mg twice daily, Zoloft 100 mg daily 5. Neuropsych: This patientiscapable of making decisions on herown behalf. 6. Skin/Wound Care:Routine skin checks 7. Fluids/Electrolytes/Nutrition:encourage PO 8.Hypertension. Lopressor 100 mg twice daily, Cozaar 100 mg daily, Norvasc 10 mg daily       -bp improved fter changing cozaar to 8pm nightly  -may need outpt adjustment 9.Diabetes mellitus with peripheral neuropathy. Hemoglobin A1c 5.7. Glucophage 1000 mg twice daily.   -fair control 10/30    10.COPD with remote tobacco abuse. No shortness of breath reported 11.Hyperlipidemia. Pravachol     LOS: 16 days A FACE TO FACE EVALUATION WAS PERFORMED  Meredith Staggers 01/19/2018, 8:22 AM

## 2018-01-19 NOTE — Progress Notes (Signed)
Pt d/c home with personal belongings.  Discharge instructions provided to pt by D. Angiuli PA-C. Pt verbalizes understanding of d/c instructions, medications, and follow up appts. Pt reports that has all equipment at home.  Advised to keep advance directives at home and have someone know where they are at all times.

## 2018-01-20 ENCOUNTER — Telehealth: Payer: Self-pay | Admitting: Registered Nurse

## 2018-01-20 NOTE — Telephone Encounter (Signed)
Placed a call to Ms. Ellwanger, no answer. Left message to return the call. 1st attempt.

## 2018-01-23 NOTE — Telephone Encounter (Signed)
Placed a call to Ms. Walts  And her daughter Catherine Greene. No answer. Left message to return the call.  2nd Attempt.

## 2018-01-24 ENCOUNTER — Telehealth: Payer: Self-pay | Admitting: *Deleted

## 2018-01-24 NOTE — Telephone Encounter (Signed)
Catherine Greene, [facility] left a message asking for confirmation that Dr. Naaman Plummer will be signing home health orders. Medical record reviewed. Social work note reviewed.  Contacted [facility] and confirmed that Dr. Naaman Plummer will be signing home health orders

## 2018-01-30 ENCOUNTER — Encounter: Payer: Medicare Other | Admitting: Physical Medicine & Rehabilitation

## 2018-03-06 ENCOUNTER — Ambulatory Visit: Payer: Self-pay | Admitting: Adult Health

## 2018-03-08 ENCOUNTER — Encounter: Payer: Medicare Other | Admitting: Physical Medicine & Rehabilitation

## 2018-10-21 ENCOUNTER — Other Ambulatory Visit: Payer: Self-pay | Admitting: Cardiology

## 2019-11-08 IMAGING — MR MR MRA HEAD W/O CM
10 of 12 series · 30 of 48 positions shown · non-contrast
Comparison: CT head without contrast from the same day. Cerebral
arteriogram from the same day.

CLINICAL DATA: Acute onset of right-sided weakness. Previous
infarcts. Diabetes. Acute onset of symptoms beginning 10 hours ago.

EXAM:
MRI HEAD WITHOUT CONTRAST
MRA HEAD WITHOUT CONTRAST
TECHNIQUE: Multiplanar, multiecho pulse sequences of the brain and surrounding
structures were obtained without intravenous contrast. Angiographic
images of the head were obtained using MRA technique without
contrast.

[Series 5: DWI · axial · 4.0mm · 0.88mm/px · z∈[-44,+99]mm · 5 of 74 slices shown (1 of 4)]
[im 1/74]
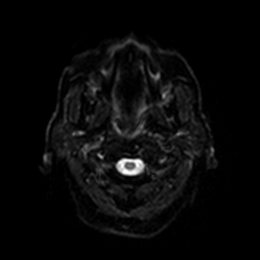
[im 19/74]
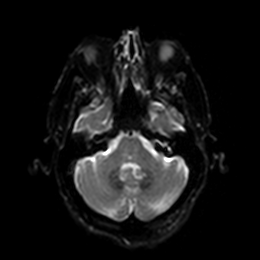
[im 37/74]
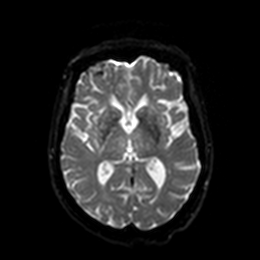
[im 55/74]
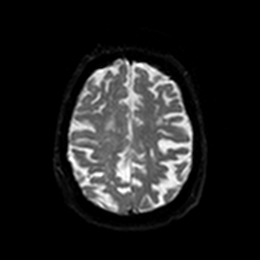
[im 74/74]
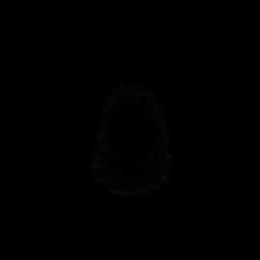

[Series 6: DWI · axial · 4.0mm · 0.88mm/px · z∈[-44,+99]mm · 3 of 37 slices shown (2 of 4)]
[im 1/37]
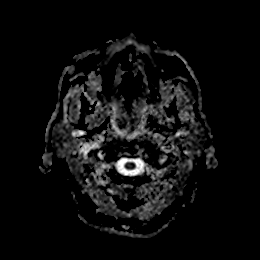
[im 19/37]
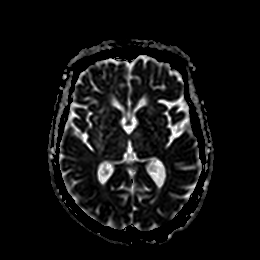
[im 37/37]
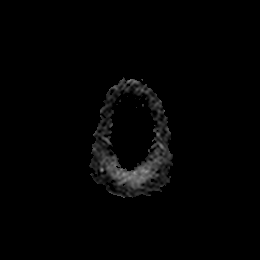

[Series 7: DWI · coronal · 4.0mm · 0.88mm/px · 4 of 64 slices shown (3 of 4)]
[im 1/64]
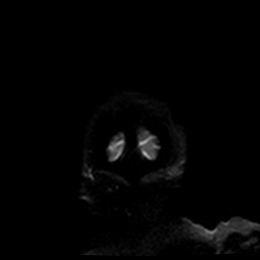
[im 22/64]
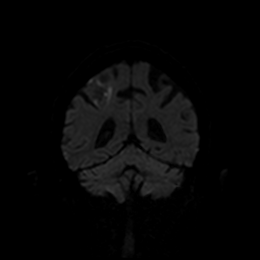
[im 43/64]
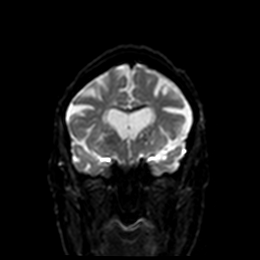
[im 64/64]
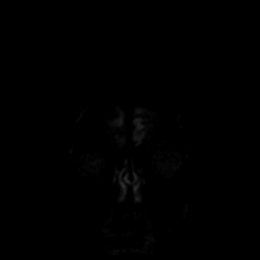

[Series 8: DWI · coronal · 4.0mm · 0.88mm/px · 2 of 32 slices shown (4 of 4)]
[im 1/32]
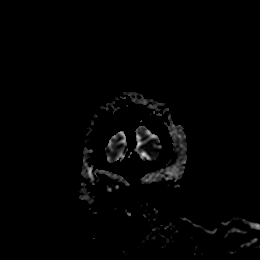
[im 32/32]
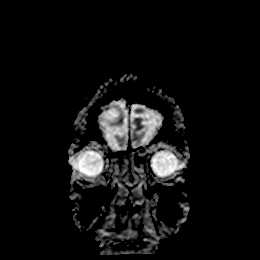

[Series 13: T1 · sagittal · 5.0mm · 0.75mm/px · 2 of 23 slices shown]
[im 1/23]
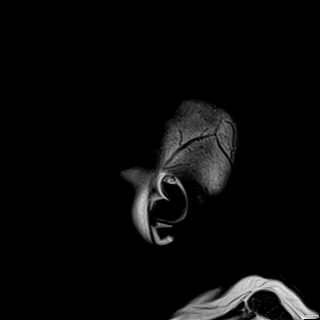
[im 23/23]
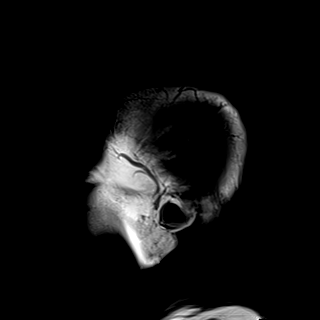

[Series 14: T2 · axial · 5.0mm · 0.72mm/px · z∈[-39,+104]mm · 2 of 25 slices shown]
[im 1/25]
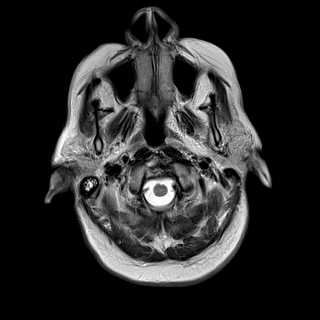
[im 25/25]
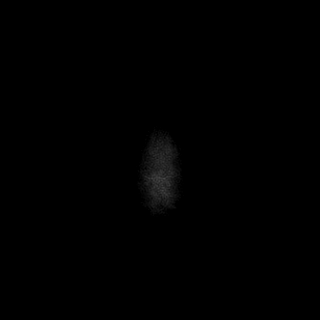

[Series 15: FLAIR · axial · 5.0mm · 0.45mm/px · z∈[-41,+102]mm · 2 of 25 slices shown]
[im 1/25]
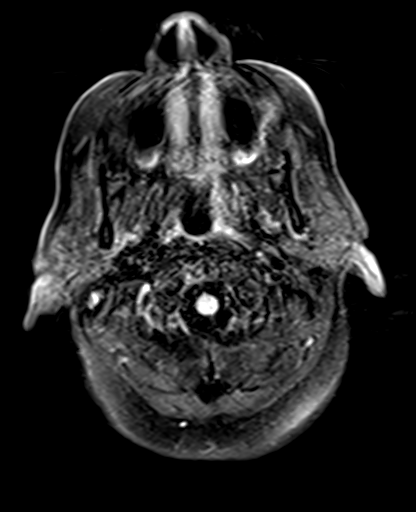
[im 25/25]
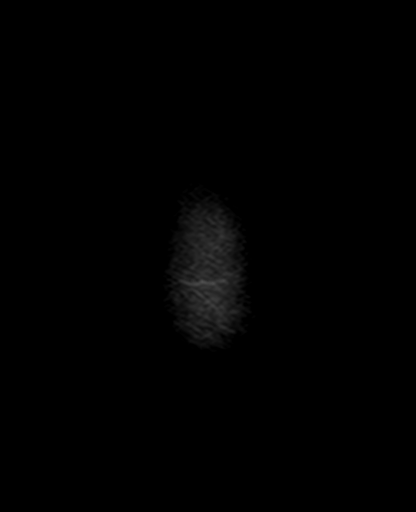

[Series 16: swi_images · axial · 3.0mm · 0.90mm/px · z∈[-57,+118]mm · 4 of 60 slices shown]
[im 1/60]
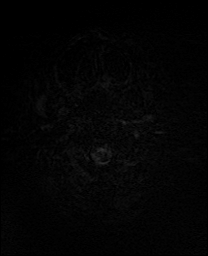
[im 20/60]
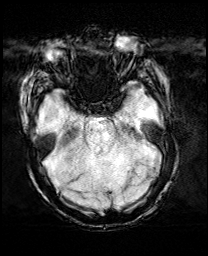
[im 40/60]
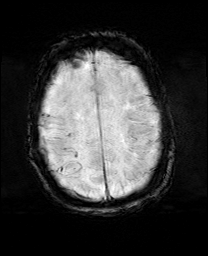
[im 60/60]
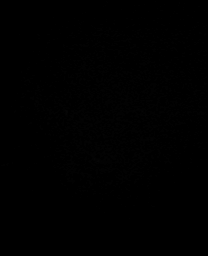

[Series 17: mip_images(sw) · axial · 24.0mm · 0.90mm/px · z∈[-47,+108]mm · 4 of 53 slices shown]
[im 1/53]
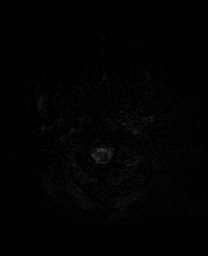
[im 18/53]
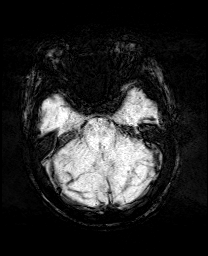
[im 35/53]
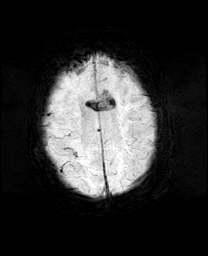
[im 53/53]
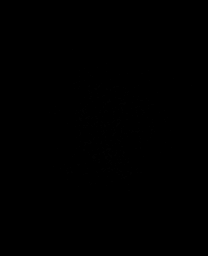

[Series 19: T2 post-contrast · coronal · 5.0mm · 0.72mm/px · 2 of 28 slices shown]
[im 1/28]
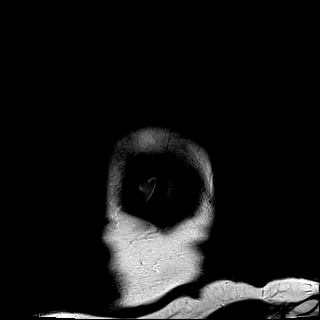
[im 28/28]
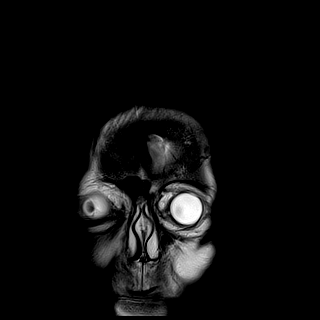

[30 of 48 positions shown; findings below may reference images not displayed]

FINDINGS: MRI HEAD FINDINGS

Brain: The diffusion-weighted images demonstrate areas of acute
cortical infarction involving the right middle frontal gyrus and the
precentral gyrus. There are areas of involvement in the right
parietal lobe in occluding the right postcentral gyrus. Scattered
foci of restricted diffusion are present in the right temporal and
occipital lobe along a watershed distribution. A more remote right
occipital pole infarct is present.

No hemorrhage is present. T2 signal changes are present within the
infarct areas. There is moderate diffuse T2 signal change or
bilaterally. Advanced atrophy is present. Dilated perivascular
spaces are present in the corona radiata bilaterally. White matter
changes extend into the brainstem.

Vascular: Abnormal signal is present in the left internal carotid
artery to the cavernous segment. This is consistent with the known
occlusion. Right ICA flow and posterior circulation flow is present.

Skull and upper cervical spine: The skull base is normal. The
craniocervical junction is normal. There is slight retrolisthesis at
C3-4 with disc disease at C3-4 and C4-5.

Sinuses/Orbits: The paranasal sinuses are clear. Bilateral mastoid
effusions are present. No obstructing nasopharyngeal lesion is
present. Globes and orbits are within normal limits.

MRA HEAD FINDINGS

Left ICA occlusion is noted. There is marked decreased signal in the
reconstituted right IC left ICA terminus. Decreased signal extends
into the left a 2 segment with significant signal loss in left MCA
vessels. Right A1 scratched at the right internal carotid artery is
within normal limits. There is a mild to moderate stenosis at the
anterior genu. The right ICA terminus is normal. A1 and M1 segments
are normal. There is some attenuation of distal MCA branch vessels
without a significant proximal occlusion.

The left vertebral artery is the dominant vessel. Left PICA origin
is visualized. The right PICA is not visualized. Basilar artery is
normal. Both posterior cerebral arteries originate from the basilar
tip. PCA branch vessels are within normal limits.
IMPRESSION: 1. Scattered foci of acute/subacute cortical infarcts involving the
right frontal, parietal, temporal, and occipital lobes. These may be
due to proximal emboli. Watershed infarcts are also considered.
2. More remote cortical infarct involves the right occipital pole.
3. Advanced white matter disease reflects underlying microvascular
ischemia.
4. Occluded left internal carotid artery with partial
revascularization of the left ICA terminus. Decreased flow to the
left ICA terminus, left ACA, and left MCA vessels.
5. Mild to moderate narrowing at the genu of the cavernous right
internal carotid artery.
6. No other significant proximal stenosis on the right to account
for the areas of infarction. No emergent large vessel occlusion on
the right.
7. The posterior circulation is unremarkable.

## 2019-12-07 ENCOUNTER — Ambulatory Visit: Payer: Medicare Other | Admitting: Cardiovascular Disease

## 2020-04-24 ENCOUNTER — Encounter: Payer: Self-pay | Admitting: Cardiovascular Disease

## 2020-04-24 ENCOUNTER — Ambulatory Visit: Payer: Medicare Other | Admitting: Cardiovascular Disease

## 2020-04-24 ENCOUNTER — Other Ambulatory Visit: Payer: Self-pay

## 2020-04-24 VITALS — BP 138/60 | HR 80 | Ht 62.0 in | Wt 160.0 lb

## 2020-04-24 DIAGNOSIS — E7800 Pure hypercholesterolemia, unspecified: Secondary | ICD-10-CM

## 2020-04-24 DIAGNOSIS — I10 Essential (primary) hypertension: Secondary | ICD-10-CM

## 2020-04-24 DIAGNOSIS — I2510 Atherosclerotic heart disease of native coronary artery without angina pectoris: Secondary | ICD-10-CM | POA: Diagnosis not present

## 2020-04-24 NOTE — Progress Notes (Signed)
Chief Complaint  Patient presents with  . Follow-up    CAD   History of Present Illness: 80 yo female with history of COPD, CAD, HTN, DM, HLD, prior CVA 1996, 2017 and 2019 as well as PE who is here today for cardiac follow up. She has a history of remote PCI in 1998. Stress myoview February 2012 with no ischemia. She is known to have carotid artery disease and has this followed in primary care. Echo May 2015 with normal LV systolic function, no significant valve disease. She had a CVA in June 2017 and was given lytics at [facility]. Echo June 2017 with normal LV systolic function, [id number] with grade 2 diastolic dysfunction. Carotid dopplers with minimal plaque RICA, chronically occluded LICA. She was discharged home on ASA. Recurrent stroke in October 2019 at [facility]. Transcranial doppler 2019 with no clinically significant shunting. Echo October with LVEF=65-70%. Notes indicate that she takes Eliquis given history of PE but she is not sure that she is still taking Eliquis.   She is here today for follow up. The patient denies any chest pain, palpitations, lower extremity edema, orthopnea, PND, dizziness, near syncope or syncope. She endorses ongoing dyspnea. She was seen in the [facility] office in October 2021 and told that she had COPD. She was started on Anoro but has not used it due to cost. She is supposed to wear O2 at night but she only wears it every other night.    Primary Care Physician: Jene Every, MD  Past Medical History:  Diagnosis Date  . CAD (coronary artery disease)   . Diabetes mellitus    type 2  . Hyperlipidemia   . Hypertension   . Past heart attack 1998    Past Surgical History:  Procedure Laterality Date  . APPENDECTOMY    . BACK SURGERY     x 2  . CESAREAN SECTION     x 3  . IR ANGIO INTRA EXTRACRAN SEL COM CAROTID INNOMINATE BILAT MOD SED  12/28/2017  . IR ANGIO VERTEBRAL SEL SUBCLAVIAN INNOMINATE UNI R MOD SED  12/28/2017  . IR ANGIO VERTEBRAL SEL VERTEBRAL  UNI L MOD SED  12/28/2017  . NECK SURGERY    . RADIOLOGY WITH ANESTHESIA N/A 12/28/2017   Procedure: IR WITH ANESTHESIA;  Surgeon: Luanne Bras, MD;  Location: [facility];  Service: Radiology;  Laterality: N/A;  . Skin cancer removal face    . TONSILLECTOMY      Current Outpatient Medications  Medication Sig Dispense Refill  . acetaminophen (TYLENOL) 325 MG tablet Take 2 tablets (650 mg total) by mouth every 4 (four) hours as needed for mild pain (or temp > 37.5 C (99.5 F)).    Marland Kitchen amLODipine (NORVASC) 10 MG tablet Take 1 tablet (10 mg total) by mouth daily. 30 tablet 1  . aspirin EC 81 MG EC tablet Take 1 tablet (81 mg total) by mouth daily.    . busPIRone (BUSPAR) 15 MG tablet Take 1 tablet (15 mg total) by mouth 2 (two) times daily. 30 tablet 0  . butalbital-acetaminophen-caffeine (FIORICET, ESGIC) 50-325-40 MG tablet Take 1 tablet by mouth every 12 (twelve) hours as needed for headache or migraine. 14 tablet 0  . calcium carbonate (TUMS - DOSED IN MG ELEMENTAL CALCIUM) 500 MG chewable tablet Chew 1 tablet (200 mg of elemental calcium total) by mouth every 4 (four) hours as needed for indigestion or heartburn.    . diclofenac sodium (VOLTAREN) 1 % GEL Apply 2 g  topically 4 (four) times daily. 1 Tube 1  . gabapentin (NEURONTIN) 400 MG capsule Take 1 capsule (400 mg total) by mouth every evening. 30 capsule 1  . losartan (COZAAR) 100 MG tablet Take 1 tablet (100 mg total) by mouth daily. 30 tablet 1  . metFORMIN (GLUCOPHAGE) 1000 MG tablet Take 1 tablet (1,000 mg total) by mouth 2 (two) times daily with a meal. 60 tablet 0  . metoprolol tartrate (LOPRESSOR) 100 MG tablet TAKE TWO TABLETS BY MOUTH TWICE DAILY  60 tablet 0  . Omega-3 Fatty Acids (FISH OIL) 1000 MG CAPS Take 2,000 mg by mouth daily.    . pravastatin (PRAVACHOL) 80 MG tablet Take 1 tablet (80 mg total) by mouth daily. 30 tablet 1  . ranitidine (ZANTAC) 150 MG tablet Take 1 tablet (150 mg total) by mouth 2 (two) times daily. 60  tablet 0  . sertraline (ZOLOFT) 100 MG tablet Take 1 tablet (100 mg total) by mouth daily. 30 tablet 0   No current facility-administered medications for this visit.    Allergies  Allergen Reactions  . Codeine     REACTION: sickness  . Contrast Media [Iodinated Diagnostic Agents]   . Metrizamide Other (See Comments)    Upset stomach  . Morphine     REACTION: sickness    Social History   Socioeconomic History  . Marital status: Divorced    Spouse name: Not on file  . Number of children: 3  . Years of education: Not on file  . Highest education level: Not on file  Occupational History  . Occupation: retired    Fish farm manager: RETIRED  Tobacco Use  . Smoking status: Former Smoker    Packs/day: 2.00    Years: 40.00    Pack years: 80.00    Types: Cigarettes    Quit date: 03/22/1996    Years since quitting: 24.1  . Smokeless tobacco: Never Used  Substance and Sexual Activity  . Alcohol use: No  . Drug use: No  . Sexual activity: Not on file  Other Topics Concern  . Not on file  Social History Narrative  . Not on file   Social Determinants of Health   Financial Resource Strain: Not on file  Food Insecurity: Not on file  Transportation Needs: Not on file  Physical Activity: Not on file  Stress: Not on file  Social Connections: Not on file  Intimate Partner Violence: Not on file    Family History  Problem Relation Age of Onset  . Bone cancer Mother 39       died  . Heart failure Father 76       died    Review of Systems:  As stated in the HPI and otherwise negative.   BP 138/60   Pulse 80   Ht 5\' 2"  (1.575 m)   Wt 160 lb (72.6 kg)   SpO2 90%   BMI 29.26 kg/m   Physical Examination: General: Well developed, well nourished, NAD  HEENT: OP clear, mucus membranes moist  SKIN: warm, dry. No rashes. Neuro: No focal deficits  Musculoskeletal: Muscle strength 5/5 all ext  Psychiatric: Mood and affect normal  Neck: No JVD, no carotid bruits, no thyromegaly, no  lymphadenopathy.  Lungs:Clear bilaterally, no wheezes, rhonci, crackles Cardiovascular: Regular rate and rhythm. No murmurs, gallops or rubs. Abdomen:Soft. Bowel sounds present. Non-tender.  Extremities: No lower extremity edema. Pulses are 2 + in the bilateral DP/PT.  Echo October 2019: - Left ventricle: The cavity size was  normal. There was moderate  concentric hypertrophy. Systolic function was vigorous. The  estimated ejection fraction was in the range of 65% to 70%. Wall  motion was normal; there were no regional wall motion  abnormalities. There was an increased relative contribution of  atrial contraction to ventricular filling. Doppler parameters are  consistent with abnormal left ventricular relaxation (grade 1  diastolic dysfunction). Doppler parameters are consistent with  high ventricular filling pressure.  - Aortic valve: Mild focal calcification involving the right  coronary cusp.  - Mitral valve: Calcified annulus.   EKG:  EKG is ordered today. The ekg ordered today demonstrates sinus  Recent Labs: No results found for requested labs within last 8760 hours.   Lipid Panel    Component Value Date/Time   CHOL 205 (H) 12/29/2017 0455   TRIG 350 (H) 12/30/2017 0337   HDL 37 (L) 12/29/2017 0455   CHOLHDL 5.5 12/29/2017 0455   VLDL 61 (H) 12/29/2017 0455   LDLCALC 107 (H) 12/29/2017 0455     Wt Readings from Last 3 Encounters:  04/24/20 160 lb (72.6 kg)  01/18/18 150 lb 5.7 oz (68.2 kg)  08/18/17 147 lb (66.7 kg)     Other studies Reviewed: Additional studies/ records that were reviewed today include: . Review of the above records demonstrates:    Assessment and Plan:   1. CAD without angina: She has no chest pain. Will continue ASA, statin, beta blocker.     2. HTN: BP is controlled today. Continue current therapy.    3. Hyperlipidemia: Lipids followed in primary care. Continue statin.    4. Insomnia: Ambien prn  Current medicines  are reviewed at length with the patient today.  The patient does not have concerns regarding medicines.  The following changes have been made:  no change  Labs/ tests ordered today include:   Orders Placed This Encounter  Procedures  . EKG 12-Lead     Disposition:   FU with me in 12 months   Signed, Lauree Chandler, MD 04/24/2020 4:45 PM    [facility] [address], [place], [place]  [address] Phone: [phone]; Fax: [phone]

## 2020-04-24 NOTE — Patient Instructions (Addendum)
Medication Instructions:  Your physician recommends that you continue on your current medications as directed. Please refer to the Current Medication list given to you today.  *If you need a refill on your cardiac medications before your next appointment, please call your pharmacy*   Lab Work: None today  Testing/Procedures: None ordered    Follow-Up: At [facility], you and your health needs are our priority.  As part of our continuing mission to provide you with exceptional heart care, we have created designated Provider Care Teams.  These Care Teams include your primary Cardiologist (physician) and Advanced Practice Providers (APPs -  Physician Assistants and Nurse Practitioners) who all work together to provide you with the care you need, when you need it.  We recommend signing up for the patient portal called "MyChart".  Sign up information is provided on this After Visit Summary.  MyChart is used to connect with patients for Virtual Visits (Telemedicine).  Patients are able to view lab/test results, encounter notes, upcoming appointments, etc.  Non-urgent messages can be sent to your provider as well.   To learn more about what you can do with MyChart, go to [email or web address].    Your next appointment:   1 year(s)  The format for your next appointment:   In Person  Provider:   Lauree Chandler, MD

## 2020-11-27 NOTE — Telephone Encounter (Signed)
error 

## 2021-05-15 ENCOUNTER — Ambulatory Visit: Payer: Medicare Other | Admitting: Cardiovascular Disease

## 2021-10-02 ENCOUNTER — Ambulatory Visit: Payer: Medicare Other | Admitting: Cardiovascular Disease

## 2022-10-05 ENCOUNTER — Ambulatory Visit: Payer: Medicare Other | Attending: Physician Assistant | Admitting: Physician Assistant

## 2022-10-05 ENCOUNTER — Encounter: Payer: Self-pay | Admitting: Physician Assistant
